# Patient Record
Sex: Female | Born: 1950 | Race: Black or African American | Hispanic: No | Marital: Single | Smoking: Never smoker
Health system: Southern US, Community
[De-identification: ages and names within clinical notes are randomized; demographics above are authoritative.]

## PROBLEM LIST (undated history)

## (undated) DIAGNOSIS — E119 Type 2 diabetes mellitus without complications: Secondary | ICD-10-CM

## (undated) DIAGNOSIS — D631 Anemia in chronic kidney disease: Secondary | ICD-10-CM

## (undated) DIAGNOSIS — G629 Polyneuropathy, unspecified: Secondary | ICD-10-CM

## (undated) DIAGNOSIS — K219 Gastro-esophageal reflux disease without esophagitis: Secondary | ICD-10-CM

## (undated) DIAGNOSIS — I10 Essential (primary) hypertension: Secondary | ICD-10-CM

## (undated) DIAGNOSIS — I5030 Unspecified diastolic (congestive) heart failure: Secondary | ICD-10-CM

## (undated) DIAGNOSIS — Z794 Long term (current) use of insulin: Secondary | ICD-10-CM

## (undated) DIAGNOSIS — E1140 Type 2 diabetes mellitus with diabetic neuropathy, unspecified: Secondary | ICD-10-CM

## (undated) DIAGNOSIS — B029 Zoster without complications: Secondary | ICD-10-CM

## (undated) DIAGNOSIS — IMO0001 Reserved for inherently not codable concepts without codable children: Secondary | ICD-10-CM

## (undated) DIAGNOSIS — N2581 Secondary hyperparathyroidism of renal origin: Secondary | ICD-10-CM

## (undated) DIAGNOSIS — M1990 Unspecified osteoarthritis, unspecified site: Secondary | ICD-10-CM

## (undated) DIAGNOSIS — I739 Peripheral vascular disease, unspecified: Secondary | ICD-10-CM

## (undated) DIAGNOSIS — E039 Hypothyroidism, unspecified: Secondary | ICD-10-CM

## (undated) DIAGNOSIS — N186 End stage renal disease: Secondary | ICD-10-CM

## (undated) DIAGNOSIS — E11319 Type 2 diabetes mellitus with unspecified diabetic retinopathy without macular edema: Secondary | ICD-10-CM

## (undated) DIAGNOSIS — N189 Chronic kidney disease, unspecified: Secondary | ICD-10-CM

## (undated) HISTORY — DX: Reserved for inherently not codable concepts without codable children: IMO0001

## (undated) HISTORY — DX: Chronic kidney disease, unspecified: N18.9

## (undated) HISTORY — DX: Type 2 diabetes mellitus with diabetic neuropathy, unspecified: E11.40

## (undated) HISTORY — DX: Secondary hyperparathyroidism of renal origin: N25.81

## (undated) HISTORY — DX: Essential (primary) hypertension: I10

## (undated) HISTORY — DX: Long term (current) use of insulin: Z79.4

## (undated) HISTORY — DX: Gastro-esophageal reflux disease without esophagitis: K21.9

## (undated) HISTORY — PX: CHOLECYSTECTOMY: SHX55

## (undated) HISTORY — DX: Type 2 diabetes mellitus with unspecified diabetic retinopathy without macular edema: E11.319

## (undated) HISTORY — DX: Hypothyroidism, unspecified: E03.9

## (undated) HISTORY — DX: Unspecified osteoarthritis, unspecified site: M19.90

## (undated) HISTORY — PX: THYROIDECTOMY: SHX17

## (undated) HISTORY — DX: Anemia in chronic kidney disease: D63.1

## (undated) HISTORY — DX: Type 2 diabetes mellitus without complications: E11.9

## (undated) HISTORY — PX: ABDOMINAL HYSTERECTOMY: SHX81

## (undated) SURGERY — A/V SHUNTOGRAM/FISTULAGRAM
Anesthesia: Moderate Sedation | Laterality: Left

---

## 2004-09-09 ENCOUNTER — Ambulatory Visit: Payer: Self-pay | Admitting: Gastroenterology

## 2005-09-07 ENCOUNTER — Ambulatory Visit: Payer: Self-pay | Admitting: Internal Medicine

## 2006-06-16 ENCOUNTER — Ambulatory Visit: Payer: Self-pay | Admitting: Internal Medicine

## 2006-06-19 ENCOUNTER — Ambulatory Visit: Payer: Self-pay | Admitting: Internal Medicine

## 2006-07-19 ENCOUNTER — Ambulatory Visit: Payer: Self-pay | Admitting: Internal Medicine

## 2006-09-08 ENCOUNTER — Ambulatory Visit: Payer: Self-pay | Admitting: Internal Medicine

## 2007-09-13 ENCOUNTER — Ambulatory Visit: Payer: Self-pay | Admitting: Internal Medicine

## 2008-07-12 ENCOUNTER — Ambulatory Visit: Payer: Self-pay | Admitting: Internal Medicine

## 2008-08-17 ENCOUNTER — Emergency Department: Payer: Self-pay | Admitting: Emergency Medicine

## 2008-08-28 ENCOUNTER — Ambulatory Visit: Payer: Self-pay | Admitting: Unknown Physician Specialty

## 2008-10-09 ENCOUNTER — Ambulatory Visit: Payer: Self-pay | Admitting: Internal Medicine

## 2009-10-16 ENCOUNTER — Ambulatory Visit: Payer: Self-pay | Admitting: Internal Medicine

## 2009-12-30 ENCOUNTER — Ambulatory Visit: Payer: Self-pay | Admitting: Gastroenterology

## 2010-11-11 ENCOUNTER — Ambulatory Visit: Payer: Self-pay | Admitting: Internal Medicine

## 2011-12-01 ENCOUNTER — Ambulatory Visit: Payer: Self-pay | Admitting: Internal Medicine

## 2012-12-06 ENCOUNTER — Ambulatory Visit: Payer: Self-pay | Admitting: Internal Medicine

## 2013-06-01 ENCOUNTER — Ambulatory Visit: Payer: Self-pay | Admitting: Internal Medicine

## 2013-06-08 ENCOUNTER — Ambulatory Visit: Payer: Self-pay | Admitting: Internal Medicine

## 2013-06-15 ENCOUNTER — Ambulatory Visit: Payer: Self-pay | Admitting: Surgery

## 2013-06-23 ENCOUNTER — Ambulatory Visit: Payer: Self-pay | Admitting: Surgery

## 2013-06-27 LAB — PATHOLOGY REPORT

## 2013-08-01 ENCOUNTER — Ambulatory Visit: Payer: Self-pay | Admitting: Hematology and Oncology

## 2013-08-01 LAB — CBC CANCER CENTER
Basophil #: 0.1 x10 3/mm (ref 0.0–0.1)
Basophil %: 1.2 %
Eosinophil %: 2.8 %
HCT: 30.5 % — ABNORMAL LOW (ref 35.0–47.0)
HGB: 10.2 g/dL — ABNORMAL LOW (ref 12.0–16.0)
Lymphocyte #: 2.5 x10 3/mm (ref 1.0–3.6)
Lymphocyte %: 39.6 %
MCHC: 33.6 g/dL (ref 32.0–36.0)
MCV: 91 fL (ref 80–100)
Monocyte #: 0.6 x10 3/mm (ref 0.2–0.9)
Neutrophil #: 3 x10 3/mm (ref 1.4–6.5)
Platelet: 250 x10 3/mm (ref 150–440)
RBC: 3.35 10*6/uL — ABNORMAL LOW (ref 3.80–5.20)

## 2013-08-19 ENCOUNTER — Ambulatory Visit: Payer: Self-pay | Admitting: Hematology and Oncology

## 2013-08-31 LAB — CBC CANCER CENTER
Eosinophil #: 0.2 x10 3/mm (ref 0.0–0.7)
Eosinophil %: 2.7 %
HGB: 10.6 g/dL — ABNORMAL LOW (ref 12.0–16.0)
Lymphocyte #: 2.3 x10 3/mm (ref 1.0–3.6)
Lymphocyte %: 32.1 %
MCH: 30.5 pg (ref 26.0–34.0)
MCHC: 33.2 g/dL (ref 32.0–36.0)
MCV: 92 fL (ref 80–100)
Monocyte #: 0.8 x10 3/mm (ref 0.2–0.9)
Monocyte %: 10.6 %
Neutrophil #: 3.9 x10 3/mm (ref 1.4–6.5)
Neutrophil %: 53.4 %
Platelet: 275 x10 3/mm (ref 150–440)
RBC: 3.49 10*6/uL — ABNORMAL LOW (ref 3.80–5.20)

## 2013-08-31 LAB — IRON AND TIBC
Iron Bind.Cap.(Total): 223 ug/dL — ABNORMAL LOW (ref 250–450)
Iron Saturation: 26 %
Iron: 57 ug/dL (ref 50–170)
Unbound Iron-Bind.Cap.: 166 ug/dL

## 2013-08-31 LAB — FERRITIN: Ferritin (ARMC): 285 ng/mL (ref 8–388)

## 2013-08-31 LAB — FOLATE: Folic Acid: 15.9 ng/mL (ref 3.1–100.0)

## 2013-09-18 ENCOUNTER — Ambulatory Visit: Payer: Self-pay | Admitting: Hematology and Oncology

## 2013-10-23 ENCOUNTER — Ambulatory Visit: Payer: Self-pay | Admitting: Gastroenterology

## 2013-12-13 ENCOUNTER — Ambulatory Visit: Payer: Self-pay | Admitting: Hematology and Oncology

## 2013-12-21 ENCOUNTER — Ambulatory Visit: Payer: Self-pay | Admitting: Hematology and Oncology

## 2014-01-24 ENCOUNTER — Ambulatory Visit: Payer: Self-pay | Admitting: Internal Medicine

## 2014-02-13 ENCOUNTER — Ambulatory Visit: Payer: Self-pay | Admitting: Surgery

## 2014-09-09 ENCOUNTER — Emergency Department: Payer: Self-pay | Admitting: Internal Medicine

## 2014-09-09 LAB — BASIC METABOLIC PANEL
ANION GAP: 8 (ref 7–16)
BUN: 41 mg/dL — ABNORMAL HIGH (ref 7–18)
CHLORIDE: 108 mmol/L — AB (ref 98–107)
CO2: 25 mmol/L (ref 21–32)
CREATININE: 1.47 mg/dL — AB (ref 0.60–1.30)
Calcium, Total: 8.4 mg/dL — ABNORMAL LOW (ref 8.5–10.1)
EGFR (African American): 46 — ABNORMAL LOW
EGFR (Non-African Amer.): 38 — ABNORMAL LOW
GLUCOSE: 173 mg/dL — AB (ref 65–99)
Osmolality: 296 (ref 275–301)
Potassium: 4.3 mmol/L (ref 3.5–5.1)
SODIUM: 141 mmol/L (ref 136–145)

## 2014-09-09 LAB — CBC WITH DIFFERENTIAL/PLATELET
BASOS PCT: 1.2 %
Basophil #: 0.1 10*3/uL (ref 0.0–0.1)
EOS PCT: 3.1 %
Eosinophil #: 0.2 10*3/uL (ref 0.0–0.7)
HCT: 32.7 % — AB (ref 35.0–47.0)
HGB: 10.5 g/dL — AB (ref 12.0–16.0)
LYMPHS ABS: 2.5 10*3/uL (ref 1.0–3.6)
LYMPHS PCT: 36.5 %
MCH: 30.1 pg (ref 26.0–34.0)
MCHC: 32.2 g/dL (ref 32.0–36.0)
MCV: 93 fL (ref 80–100)
Monocyte #: 0.6 x10 3/mm (ref 0.2–0.9)
Monocyte %: 9.5 %
Neutrophil #: 3.4 10*3/uL (ref 1.4–6.5)
Neutrophil %: 49.7 %
Platelet: 280 10*3/uL (ref 150–440)
RBC: 3.5 10*6/uL — ABNORMAL LOW (ref 3.80–5.20)
RDW: 13.3 % (ref 11.5–14.5)
WBC: 6.8 10*3/uL (ref 3.6–11.0)

## 2014-09-09 LAB — URINALYSIS, COMPLETE
Bacteria: NONE SEEN
Bilirubin,UR: NEGATIVE
Glucose,UR: NEGATIVE mg/dL (ref 0–75)
Ketone: NEGATIVE
Leukocyte Esterase: NEGATIVE
Nitrite: NEGATIVE
Ph: 5 (ref 4.5–8.0)
RBC,UR: 5 /HPF (ref 0–5)
Specific Gravity: 1.014 (ref 1.003–1.030)
Squamous Epithelial: 1
WBC UR: 3 /HPF (ref 0–5)

## 2014-09-09 LAB — TROPONIN I: Troponin-I: <0.02 ng/mL

## 2015-02-08 NOTE — Op Note (Signed)
PATIENT NAME:  Kendra [name], Kendra [name] MR#:  [id number] DATE OF BIRTH:  Oct 14, 1951  DATE OF PROCEDURE:  06/23/2013  PREOPERATIVE DIAGNOSIS: Chronic cholecystitis.   POSTOPERATIVE DIAGNOSIS: Chronic cholecystitis.   PROCEDURE: Laparoscopic cholecystectomy, cholangiogram.   SURGEON: Rochel Brome, MD  ANESTHESIA: General.   INDICATION: This 64 year old female reports right upper quadrant pains dating back to April. She has also had fatty food intolerance. Ultrasound demonstrated sludge within the gallbladder, hepatobiliary scan with abnormally low ejection fraction of 21% and surgery was recommended for definitive treatment.   DESCRIPTION OF PROCEDURE: The patient was placed on the operating table in the supine position under general endotracheal anesthesia. The abdomen was prepared with ChloraPrep and draped in a sterile manner.   A short incision was made in the inferior aspect of the umbilicus and carried down to the deep fascia which was grasped with laryngeal hook and elevated. A Veress needle was inserted, aspirated and irrigated with a saline solution. Next, the peritoneal cavity was inflated with carbon dioxide. The Veress needle was removed. The 10 mm cannula was inserted. The 10 mm, 0 degree laparoscope was inserted to view the peritoneal cavity. Another incision was made in the epigastrium slightly to the right of the midline to introduce an 11 mm cannula. Two incisions were made in the lateral aspect of the right upper quadrant to insert two 5 mm cannulas.   The liver appeared normal. The gallbladder was somewhat distended and was retracted towards the right shoulder. The infundibulum was retracted inferiorly and laterally. The porta hepatis was demonstrated. The gallbladder neck was mobilized with incision of the visceral peritoneum. The cystic duct was dissected free from surrounding structures. It was somewhat large in size. The cystic artery was dissected free from surrounding structures.  The gallbladder neck was further mobilized. A critical view of safety was demonstrated. The artery was directly overlying the cystic duct and was controlled with endoclips and divided. Next, an Endo Clip was placed across the cystic duct. An incision was made in the cystic duct to introduce a Reddick catheter. Half-strength Conray-60 dye was injected as the cholangiogram was done with fluoroscopy viewing the biliary tree. There were 2 lucencies which were interpreted as air bubbles, but there was prompt flow of dye into the duodenum, and the Reddick catheter was removed. The cystic duct was doubly ligated with endoclips and divided. The gallbladder was dissected free from the liver with hook and cautery. There was scant bleeding. Several small bleeding points were cauterized. Hemostasis was subsequently intact. The gallbladder was delivered up through the infraumbilical incision, opened and suctioned. It did contain some dark bile and was submitted in formalin for routine pathology. The right upper quadrant was further inspected. Hemostasis was intact. The cannulas were removed. Carbon dioxide was allowed to escape from the peritoneal cavity. Several small subcutaneous bleeding points were cauterized. The wounds were closed with interrupted 5-0 chromic subcuticular suture, benzoin and Steri-Strips. Dressings were applied with paper tape. The patient tolerated surgery satisfactorily and was prepared for transfer to the recovery room. ____________________________ Lenna Sciara. Rochel Brome, MD jws:sb D: 06/23/2013 14:31:36 ET T: 06/23/2013 17:00:38 ET JOB#: [id number]  cc: Loreli Dollar, MD, <Dictator> Loreli Dollar MD ELECTRONICALLY SIGNED 06/26/2013 20:08

## 2015-07-02 ENCOUNTER — Other Ambulatory Visit: Payer: Self-pay | Admitting: Nephrology

## 2015-07-02 DIAGNOSIS — N183 Chronic kidney disease, stage 3 unspecified: Secondary | ICD-10-CM

## 2015-07-03 ENCOUNTER — Ambulatory Visit: Payer: Self-pay

## 2015-07-08 ENCOUNTER — Ambulatory Visit
Admission: RE | Admit: 2015-07-08 | Discharge: 2015-07-08 | Disposition: A | Discharge status: Home / Self Care | Payer: Managed Care, Other (non HMO) | Source: Ambulatory Visit | Attending: Nephrology | Admitting: Nephrology

## 2015-07-08 DIAGNOSIS — I10 Essential (primary) hypertension: Secondary | ICD-10-CM | POA: Insufficient documentation

## 2015-07-08 DIAGNOSIS — N183 Chronic kidney disease, stage 3 unspecified: Secondary | ICD-10-CM

## 2015-07-08 DIAGNOSIS — N179 Acute kidney failure, unspecified: Secondary | ICD-10-CM | POA: Diagnosis not present

## 2015-07-08 DIAGNOSIS — R809 Proteinuria, unspecified: Secondary | ICD-10-CM | POA: Insufficient documentation

## 2015-07-08 DIAGNOSIS — R319 Hematuria, unspecified: Secondary | ICD-10-CM | POA: Insufficient documentation

## 2015-07-11 ENCOUNTER — Ambulatory Visit: Payer: Self-pay

## 2015-07-11 ENCOUNTER — Ambulatory Visit: Payer: Self-pay | Admitting: Internal Medicine

## 2015-07-12 ENCOUNTER — Encounter: Payer: Self-pay | Admitting: *Deleted

## 2015-07-15 ENCOUNTER — Ambulatory Visit: Payer: Self-pay | Admitting: Internal Medicine

## 2015-07-15 ENCOUNTER — Inpatient Hospital Stay: Payer: Managed Care, Other (non HMO)

## 2015-07-15 ENCOUNTER — Encounter: Payer: Self-pay | Admitting: *Deleted

## 2015-07-15 ENCOUNTER — Inpatient Hospital Stay: Payer: Managed Care, Other (non HMO) | Attending: Internal Medicine | Admitting: Internal Medicine

## 2015-07-15 VITALS — BP 164/95 | HR 85 | Temp 97.6°F | Resp 18 | Ht 66.0 in | Wt 222.7 lb

## 2015-07-15 DIAGNOSIS — D649 Anemia, unspecified: Secondary | ICD-10-CM | POA: Insufficient documentation

## 2015-07-15 DIAGNOSIS — R0602 Shortness of breath: Secondary | ICD-10-CM | POA: Insufficient documentation

## 2015-07-15 DIAGNOSIS — N189 Chronic kidney disease, unspecified: Secondary | ICD-10-CM | POA: Insufficient documentation

## 2015-07-15 DIAGNOSIS — Z79899 Other long term (current) drug therapy: Secondary | ICD-10-CM

## 2015-07-15 DIAGNOSIS — N183 Chronic kidney disease, stage 3 unspecified: Secondary | ICD-10-CM

## 2015-07-15 DIAGNOSIS — Z794 Long term (current) use of insulin: Secondary | ICD-10-CM | POA: Diagnosis not present

## 2015-07-15 DIAGNOSIS — R5383 Other fatigue: Secondary | ICD-10-CM | POA: Diagnosis not present

## 2015-07-15 DIAGNOSIS — I10 Essential (primary) hypertension: Secondary | ICD-10-CM | POA: Insufficient documentation

## 2015-07-15 DIAGNOSIS — E119 Type 2 diabetes mellitus without complications: Secondary | ICD-10-CM | POA: Insufficient documentation

## 2015-07-15 DIAGNOSIS — D631 Anemia in chronic kidney disease: Secondary | ICD-10-CM

## 2015-07-15 DIAGNOSIS — R600 Localized edema: Secondary | ICD-10-CM | POA: Insufficient documentation

## 2015-07-15 DIAGNOSIS — I129 Hypertensive chronic kidney disease with stage 1 through stage 4 chronic kidney disease, or unspecified chronic kidney disease: Secondary | ICD-10-CM | POA: Diagnosis not present

## 2015-07-15 LAB — CBC WITH DIFFERENTIAL/PLATELET
BASOS ABS: 0.1 10*3/uL (ref 0–0.1)
BASOS PCT: 1 %
Eosinophils Absolute: 0.2 10*3/uL (ref 0–0.7)
Eosinophils Relative: 3 %
HEMATOCRIT: 26.3 % — AB (ref 35.0–47.0)
Hemoglobin: 8.7 g/dL — ABNORMAL LOW (ref 12.0–16.0)
LYMPHS PCT: 29 %
Lymphs Abs: 2 10*3/uL (ref 1.0–3.6)
MCH: 30.2 pg (ref 26.0–34.0)
MCHC: 33.2 g/dL (ref 32.0–36.0)
MCV: 90.9 fL (ref 80.0–100.0)
Monocytes Absolute: 0.7 10*3/uL (ref 0.2–0.9)
Monocytes Relative: 10 %
NEUTROS ABS: 3.9 10*3/uL (ref 1.4–6.5)
NEUTROS PCT: 57 %
Platelets: 272 10*3/uL (ref 150–440)
RBC: 2.89 MIL/uL — AB (ref 3.80–5.20)
RDW: 13.3 % (ref 11.5–14.5)
WBC: 6.9 10*3/uL (ref 3.6–11.0)

## 2015-07-15 LAB — RETICULOCYTES
RBC.: 2.89 MIL/uL — AB (ref 3.80–5.20)
RETIC COUNT ABSOLUTE: 49.1 10*3/uL (ref 19.0–183.0)
Retic Ct Pct: 1.7 % (ref 0.4–3.1)

## 2015-07-15 LAB — IRON AND TIBC
IRON: 45 ug/dL (ref 28–170)
Saturation Ratios: 18 % (ref 10.4–31.8)
TIBC: 257 ug/dL (ref 250–450)
UIBC: 212 ug/dL

## 2015-07-15 LAB — FOLATE: FOLATE: 30 ng/mL (ref >5.9)

## 2015-07-15 LAB — LACTATE DEHYDROGENASE: LDH: 242 U/L — ABNORMAL HIGH (ref 98–192)

## 2015-07-15 LAB — VITAMIN B12: Vitamin B-12: 464 pg/mL (ref 180–914)

## 2015-07-15 LAB — FERRITIN: FERRITIN: 195 ng/mL (ref 11–307)

## 2015-07-15 NOTE — Progress Notes (Signed)
[place] OFFICE PROGRESS NOTE  Patient Care Team: Kendra Brunner, MD as PCP - General (Internal Medicine) Kendra Dana, MD as Consulting Physician (Internal Medicine) Kendra Sickle, MD as Consulting Physician (Hematology and Oncology)  HPI  SUMMARY of HEMATOLOGIC HISTORY:   # CHRONIC ANEMIA; SEP 2016-Hb-9.4 Urine PEP- NEG; K/L= 3.45 [N=1.65]; NOV 2014-M protein 0.2gm [IgG+IgA]; Jan 2015 Colonoscopy/EGD with Dr.Oh Gita Kudo in 5 years]; SEP 2016- START PROCRIT 40K units qW  # CKD [SEP 2016-creat ~2; Proteinuria 3.3 gm/dL; Dr.Kolluru]  INTERVAL HISTORY:  This is my first interaction with the patient since I joined the practice September 2016. I reviewed the patient's prior chart/pertinent labs/imaging in detail; findings are summarized.  Very pleasant 64 year old  female patient with a long-standing history of anemia and also chronic renal insufficiency is here for follow-up. Patient had last been seen in in this clinic approximately 2 years ago.  Follow-up  Nephrology earlier this month showed a hemoglobin of 9.4; worsening creatinine around 2.0. She has been referred to [place] for further evaluation/  Need for Procrit.  Patient complains of significant fatigue;especially shortness of breath on exertion. She denies any blood in stools or black stools. She also complains of swelling in the swelling in the legs.   REVIEW OF SYSTEMS:  A complete 10 point review of system is done which is negative except mentioned above in history of present illness  I have reviewed the past medical history, past surgical history, social history and family history with the patient and they are unchanged from previous note unless stated above.  ALLERGIES:  has No Known Allergies.  MEDICATIONS:  Current Outpatient Prescriptions  Medication Sig Dispense Refill  . acetaminophen (TYLENOL) 500 MG tablet Take 500 mg by mouth 2 (two) times a week.    . calcium carbonate (TUMS EX) 750 MG  chewable tablet Chew 300 mg by mouth as needed. For heartburn    . enalapril (VASOTEC) 10 MG tablet Take 10 mg by mouth daily.    . ergocalciferol (VITAMIN D2) 50000 UNITS capsule Take 50,000 Units by mouth once a week.    . insulin aspart (NOVOLOG FLEXPEN) 100 UNIT/ML FlexPen Inject 12 Units into the skin 3 (three) times daily.    . insulin glargine (LANTUS) 100 UNIT/ML injection Inject 25 Units into the skin at bedtime. 25- 30 units at bedtime depending on glucose serum levels    . Insulin Pen Needle (PEN NEEDLES 31GX5/16") 31G X 8 MM MISC Use for insulin injection TID    . levothyroxine (SYNTHROID, LEVOTHROID) 137 MCG tablet Take 1 tablet by mouth daily. Take on empty stomach 30 to 60 minutes before breakfast    . meclizine (ANTIVERT) 25 MG tablet Take 12.5 mg by mouth 3 (three) times daily.    . Multiple Vitamins-Minerals (CENTRUM SILVER ADULT 50+ PO) Take 1 tablet by mouth daily.    . simvastatin (ZOCOR) 40 MG tablet Take 40 mg by mouth 2 (two) times a week.     No current facility-administered medications for this visit.    PHYSICAL EXAMINATION:   BP 164/95 mmHg  Pulse 85  Temp(Src) 97.6 F (36.4 C) (Tympanic)  Resp 18  Ht $R'5\' 6"'bN$  (1.676 m)  Wt 222 lb 10.6 oz (100.999 kg)  BMI 35.96 kg/m2  Filed Weights   07/15/15 1550  Weight: 222 lb 10.6 oz (100.999 kg)    GENERAL:alert, no distress and comfortable. She is alone.  EYES: positive for pallor OROPHARYNX:no exudate, no erythema and lips,  buccal mucosa, and tongue normal  NECK: No thyromegaly LYMPH:  no palpable lymphadenopathy in the cervical, axillary or inguinal LUNGS: clear to auscultation with normal breathing effort; No Wheeze or crackles  Cardio-vascular- Regular Rate & Rythm and no murmurs; positive for edema 1 + plus Bil.  ABDOMEN:abdomen soft, non-tender and normal bowel sounds; No hepato-splenomegaly.  Musculoskeletal:no cyanosis of digits and no clubbing  NEURO: alert & oriented x 3 with fluent speech, no focal  motor/sensory deficits. SKIN: no skin rash; positive for pallor  LABORATORY DATA:  I have reviewed the data as listed    Component Value Date/Time   NA 141 09/09/2014 1357   K 4.3 09/09/2014 1357   CL 108* 09/09/2014 1357   CO2 25 09/09/2014 1357   GLUCOSE 173* 09/09/2014 1357   BUN 41* 09/09/2014 1357   CREATININE 1.47* 09/09/2014 1357   CALCIUM 8.4* 09/09/2014 1357   GFRNONAA 38* 09/09/2014 1357   GFRAA 46* 09/09/2014 1357    No results found for: SPEP, UPEP  Lab Results  Component Value Date   WBC 6.8 09/09/2014   NEUTROABS 3.4 09/09/2014   HGB 10.5* 09/09/2014   HCT 32.7* 09/09/2014   MCV 93 09/09/2014   PLT 280 09/09/2014      Chemistry      Component Value Date/Time   NA 141 09/09/2014 1357   K 4.3 09/09/2014 1357   CL 108* 09/09/2014 1357   CO2 25 09/09/2014 1357   BUN 41* 09/09/2014 1357   CREATININE 1.47* 09/09/2014 1357      Component Value Date/Time   CALCIUM 8.4* 09/09/2014 1357       RADIOGRAPHIC STUDIES: I have personally reviewed the radiological images as listed and agreed with the findings in the report. No results found.   ASSESSMENT & PLAN:   # Chronic anemia- likely from chronic kidney disease. Most recent hemoglobin approximately 10 days ago was 9.4; this is likely  From worsening  Kidney function.  I recommend checking CBC; LDH; ferritin; folic acid; soluble transferrin receptor;  Reticulocyte count; B12; serum monoclonal workup at this time.   Discussed with the patient that Procrit would be recommended on a weekly basis- To help improve the hemoglobin. Discussed that the mechanism of action; and the potential side effects of hypertension; and if hemoglobin above 12; the potential for strokes; also the rare risk of progression of any underlying malignancy.patient agreed.    Orders Placed This Encounter  Procedures  . Multiple myeloma panel, serum (IFE and PE, serum)    Standing Status: Future     Number of Occurrences: 1      Standing Expiration Date: 07/14/2016  . Erythropoietin    Standing Status: Future     Number of Occurrences: 1     Standing Expiration Date: 07/14/2016  . Ferritin    Standing Status: Future     Number of Occurrences: 1     Standing Expiration Date: 07/14/2016  . Iron and TIBC    Standing Status: Future     Number of Occurrences: 1     Standing Expiration Date: 07/14/2016  . Haptoglobin    Standing Status: Future     Number of Occurrences: 1     Standing Expiration Date: 07/14/2016  . Lactate dehydrogenase    Standing Status: Future     Number of Occurrences: 1     Standing Expiration Date: 07/14/2016  . Vitamin B12    Standing Status: Future     Number of Occurrences: 1  Standing Expiration Date: 07/14/2016  . Folate    Standing Status: Future     Number of Occurrences: 1     Standing Expiration Date: 07/14/2016  . CBC with Differential    Standing Status: Future     Number of Occurrences: 1     Standing Expiration Date: 07/14/2016  . Soluble transferrin receptor    Standing Status: Future     Number of Occurrences: 1     Standing Expiration Date: 07/14/2016  . Hematocrit ([facility])    Standing Status: Standing     Number of Occurrences: 8     Standing Expiration Date: 07/14/2016  . Hemoglobin [facility])    Standing Status: Standing     Number of Occurrences: 8     Standing Expiration Date: 07/14/2016  . Reticulocytes    Standing Status: Future     Number of Occurrences: 1     Standing Expiration Date: 07/14/2016   All questions were answered. The patient knows to call the clinic with any problems, questions or concerns. No barriers to learning was detected. & I spent 25 counseling the patient face to face. The total time spent in the appointment was 40 minutes and more than 50% was on counseling and review of test results     Kendra Sickle, MD 07/15/2015 4:27 PM

## 2015-07-16 LAB — SOLUBLE TRANSFERRIN RECEPTOR: Transferrin Receptor: 17.2 nmol/L (ref 12.2–27.3)

## 2015-07-16 LAB — ERYTHROPOIETIN: ERYTHROPOIETIN: 12 m[IU]/mL (ref 2.6–18.5)

## 2015-07-16 LAB — HAPTOGLOBIN: Haptoglobin: 224 mg/dL — ABNORMAL HIGH (ref 34–200)

## 2015-07-17 LAB — MULTIPLE MYELOMA PANEL, SERUM
ALBUMIN/GLOB SERPL: 1.1 (ref 0.7–1.7)
ALPHA2 GLOB SERPL ELPH-MCNC: 0.9 g/dL (ref 0.4–1.0)
Albumin SerPl Elph-Mcnc: 3.4 g/dL (ref 2.9–4.4)
Alpha 1: 0.2 g/dL (ref 0.0–0.4)
B-Globulin SerPl Elph-Mcnc: 0.9 g/dL (ref 0.7–1.3)
Gamma Glob SerPl Elph-Mcnc: 1.1 g/dL (ref 0.4–1.8)
Globulin, Total: 3.1 g/dL (ref 2.2–3.9)
IGG (IMMUNOGLOBIN G), SERUM: 1035 mg/dL (ref 700–1600)
IGM, SERUM: 246 mg/dL — AB (ref 26–217)
IgA: 256 mg/dL (ref 87–352)
M Protein SerPl Elph-Mcnc: 0.2 g/dL — ABNORMAL HIGH
TOTAL PROTEIN ELP: 6.5 g/dL (ref 6.0–8.5)

## 2015-07-22 ENCOUNTER — Inpatient Hospital Stay: Payer: Managed Care, Other (non HMO)

## 2015-07-22 ENCOUNTER — Inpatient Hospital Stay: Payer: Managed Care, Other (non HMO) | Attending: Internal Medicine

## 2015-07-22 DIAGNOSIS — D631 Anemia in chronic kidney disease: Secondary | ICD-10-CM | POA: Insufficient documentation

## 2015-07-22 DIAGNOSIS — N189 Chronic kidney disease, unspecified: Secondary | ICD-10-CM | POA: Insufficient documentation

## 2015-07-29 ENCOUNTER — Other Ambulatory Visit: Payer: Self-pay | Admitting: Family Medicine

## 2015-07-29 ENCOUNTER — Inpatient Hospital Stay: Payer: Managed Care, Other (non HMO)

## 2015-08-05 ENCOUNTER — Inpatient Hospital Stay: Payer: Managed Care, Other (non HMO)

## 2015-08-12 ENCOUNTER — Inpatient Hospital Stay: Payer: Managed Care, Other (non HMO)

## 2015-08-14 ENCOUNTER — Encounter: Payer: Self-pay | Admitting: *Deleted

## 2015-08-19 ENCOUNTER — Inpatient Hospital Stay: Payer: Managed Care, Other (non HMO)

## 2015-08-19 ENCOUNTER — Other Ambulatory Visit: Payer: Self-pay | Admitting: *Deleted

## 2015-08-19 DIAGNOSIS — N183 Chronic kidney disease, stage 3 unspecified: Secondary | ICD-10-CM

## 2015-08-19 DIAGNOSIS — N189 Chronic kidney disease, unspecified: Secondary | ICD-10-CM | POA: Diagnosis not present

## 2015-08-19 DIAGNOSIS — D631 Anemia in chronic kidney disease: Secondary | ICD-10-CM

## 2015-08-19 LAB — HEMATOCRIT: HEMATOCRIT: 25.5 % — AB (ref 35.0–47.0)

## 2015-08-19 LAB — HEMOGLOBIN: HEMOGLOBIN: 8.5 g/dL — AB (ref 12.0–16.0)

## 2015-08-19 NOTE — Progress Notes (Signed)
RN unable to administer procrit today. BP elevated 190/96 per Olegario Shearer, RN. Recheck 183/82. Patient stated that she took her antihypertensive medications today as prescribed. Dr. Rogue Bussing was informed. Patient was r/s for procrit for 08/20/15.

## 2015-08-19 NOTE — Progress Notes (Signed)
Patient to reschedule appointment for Procrit injection, due to high BP.

## 2015-08-20 ENCOUNTER — Inpatient Hospital Stay: Payer: Managed Care, Other (non HMO) | Attending: Internal Medicine

## 2015-08-20 VITALS — BP 159/81 | HR 96 | Resp 18

## 2015-08-20 DIAGNOSIS — N183 Chronic kidney disease, stage 3 (moderate): Secondary | ICD-10-CM | POA: Insufficient documentation

## 2015-08-20 DIAGNOSIS — Z79899 Other long term (current) drug therapy: Secondary | ICD-10-CM | POA: Diagnosis not present

## 2015-08-20 DIAGNOSIS — D631 Anemia in chronic kidney disease: Secondary | ICD-10-CM | POA: Diagnosis present

## 2015-08-20 DIAGNOSIS — N189 Chronic kidney disease, unspecified: Secondary | ICD-10-CM

## 2015-08-20 MED ORDER — EPOETIN ALFA 20000 UNIT/ML IJ SOLN
20000.0000 [IU] | Freq: Once | INTRAMUSCULAR | Status: AC
  Administered 2015-08-20: 20000 [IU] via SUBCUTANEOUS
  Filled 2015-08-20: qty 1

## 2015-08-26 ENCOUNTER — Inpatient Hospital Stay: Payer: Managed Care, Other (non HMO)

## 2015-08-26 DIAGNOSIS — N183 Chronic kidney disease, stage 3 (moderate): Principal | ICD-10-CM

## 2015-08-26 DIAGNOSIS — D631 Anemia in chronic kidney disease: Secondary | ICD-10-CM

## 2015-08-26 LAB — HEMOGLOBIN: Hemoglobin: 8.8 g/dL — ABNORMAL LOW (ref 12.0–16.0)

## 2015-08-26 LAB — HEMATOCRIT: HEMATOCRIT: 27 % — AB (ref 35.0–47.0)

## 2015-08-27 ENCOUNTER — Inpatient Hospital Stay: Payer: Managed Care, Other (non HMO) | Attending: Internal Medicine

## 2015-08-27 VITALS — BP 177/90 | HR 77 | Resp 20

## 2015-08-27 DIAGNOSIS — Z79899 Other long term (current) drug therapy: Secondary | ICD-10-CM | POA: Insufficient documentation

## 2015-08-27 DIAGNOSIS — Z794 Long term (current) use of insulin: Secondary | ICD-10-CM | POA: Insufficient documentation

## 2015-08-27 DIAGNOSIS — I129 Hypertensive chronic kidney disease with stage 1 through stage 4 chronic kidney disease, or unspecified chronic kidney disease: Secondary | ICD-10-CM | POA: Insufficient documentation

## 2015-08-27 DIAGNOSIS — D631 Anemia in chronic kidney disease: Secondary | ICD-10-CM | POA: Insufficient documentation

## 2015-08-27 DIAGNOSIS — M7989 Other specified soft tissue disorders: Secondary | ICD-10-CM | POA: Insufficient documentation

## 2015-08-27 DIAGNOSIS — R5383 Other fatigue: Secondary | ICD-10-CM | POA: Insufficient documentation

## 2015-08-27 DIAGNOSIS — N189 Chronic kidney disease, unspecified: Secondary | ICD-10-CM

## 2015-08-27 DIAGNOSIS — N183 Chronic kidney disease, stage 3 (moderate): Secondary | ICD-10-CM | POA: Insufficient documentation

## 2015-08-27 DIAGNOSIS — R0602 Shortness of breath: Secondary | ICD-10-CM | POA: Insufficient documentation

## 2015-08-27 MED ORDER — EPOETIN ALFA 20000 UNIT/ML IJ SOLN: 20000.0000 [IU] | Freq: Once | INTRAMUSCULAR | Status: DC

## 2015-08-29 ENCOUNTER — Inpatient Hospital Stay: Payer: Managed Care, Other (non HMO)

## 2015-08-29 ENCOUNTER — Ambulatory Visit: Payer: Managed Care, Other (non HMO)

## 2015-08-29 VITALS — BP 159/79 | HR 84 | Temp 97.0°F | Resp 20

## 2015-08-29 DIAGNOSIS — R5383 Other fatigue: Secondary | ICD-10-CM | POA: Diagnosis not present

## 2015-08-29 DIAGNOSIS — D631 Anemia in chronic kidney disease: Secondary | ICD-10-CM | POA: Diagnosis not present

## 2015-08-29 DIAGNOSIS — I129 Hypertensive chronic kidney disease with stage 1 through stage 4 chronic kidney disease, or unspecified chronic kidney disease: Secondary | ICD-10-CM | POA: Diagnosis present

## 2015-08-29 DIAGNOSIS — N183 Chronic kidney disease, stage 3 (moderate): Secondary | ICD-10-CM | POA: Diagnosis not present

## 2015-08-29 DIAGNOSIS — N189 Chronic kidney disease, unspecified: Principal | ICD-10-CM

## 2015-08-29 DIAGNOSIS — Z79899 Other long term (current) drug therapy: Secondary | ICD-10-CM | POA: Diagnosis not present

## 2015-08-29 DIAGNOSIS — Z794 Long term (current) use of insulin: Secondary | ICD-10-CM | POA: Diagnosis not present

## 2015-08-29 DIAGNOSIS — R0602 Shortness of breath: Secondary | ICD-10-CM | POA: Diagnosis not present

## 2015-08-29 DIAGNOSIS — M7989 Other specified soft tissue disorders: Secondary | ICD-10-CM | POA: Diagnosis not present

## 2015-08-29 MED ORDER — EPOETIN ALFA 20000 UNIT/ML IJ SOLN
20000.0000 [IU] | Freq: Once | INTRAMUSCULAR | Status: AC
  Administered 2015-08-29: 20000 [IU] via SUBCUTANEOUS
  Filled 2015-08-29: qty 1

## 2015-09-02 ENCOUNTER — Inpatient Hospital Stay: Payer: Managed Care, Other (non HMO)

## 2015-09-02 DIAGNOSIS — I129 Hypertensive chronic kidney disease with stage 1 through stage 4 chronic kidney disease, or unspecified chronic kidney disease: Secondary | ICD-10-CM | POA: Diagnosis not present

## 2015-09-02 DIAGNOSIS — D631 Anemia in chronic kidney disease: Secondary | ICD-10-CM

## 2015-09-02 DIAGNOSIS — N183 Chronic kidney disease, stage 3 (moderate): Principal | ICD-10-CM

## 2015-09-02 LAB — HEMATOCRIT: HEMATOCRIT: 28 % — AB (ref 35.0–47.0)

## 2015-09-02 LAB — HEMOGLOBIN: HEMOGLOBIN: 9.2 g/dL — AB (ref 12.0–16.0)

## 2015-09-03 ENCOUNTER — Inpatient Hospital Stay: Payer: Managed Care, Other (non HMO)

## 2015-09-03 VITALS — BP 157/84 | HR 94 | Temp 98.6°F | Resp 18

## 2015-09-03 DIAGNOSIS — I129 Hypertensive chronic kidney disease with stage 1 through stage 4 chronic kidney disease, or unspecified chronic kidney disease: Secondary | ICD-10-CM | POA: Diagnosis not present

## 2015-09-03 DIAGNOSIS — N189 Chronic kidney disease, unspecified: Principal | ICD-10-CM

## 2015-09-03 DIAGNOSIS — D631 Anemia in chronic kidney disease: Secondary | ICD-10-CM

## 2015-09-03 MED ORDER — EPOETIN ALFA 20000 UNIT/ML IJ SOLN
20000.0000 [IU] | Freq: Once | INTRAMUSCULAR | Status: AC
  Administered 2015-09-03: 20000 [IU] via SUBCUTANEOUS
  Filled 2015-09-03: qty 1

## 2015-09-16 ENCOUNTER — Ambulatory Visit: Payer: Managed Care, Other (non HMO) | Admitting: Internal Medicine

## 2015-09-16 ENCOUNTER — Other Ambulatory Visit: Payer: Self-pay | Admitting: *Deleted

## 2015-09-16 ENCOUNTER — Inpatient Hospital Stay: Payer: Managed Care, Other (non HMO)

## 2015-09-16 ENCOUNTER — Inpatient Hospital Stay (HOSPITAL_BASED_OUTPATIENT_CLINIC_OR_DEPARTMENT_OTHER): Payer: Managed Care, Other (non HMO) | Admitting: Internal Medicine

## 2015-09-16 ENCOUNTER — Encounter: Payer: Self-pay | Admitting: Internal Medicine

## 2015-09-16 VITALS — BP 181/80 | HR 86 | Temp 97.9°F | Resp 18 | Ht 66.0 in | Wt 220.5 lb

## 2015-09-16 DIAGNOSIS — M7989 Other specified soft tissue disorders: Secondary | ICD-10-CM

## 2015-09-16 DIAGNOSIS — N183 Chronic kidney disease, stage 3 (moderate): Principal | ICD-10-CM

## 2015-09-16 DIAGNOSIS — R0602 Shortness of breath: Secondary | ICD-10-CM

## 2015-09-16 DIAGNOSIS — I129 Hypertensive chronic kidney disease with stage 1 through stage 4 chronic kidney disease, or unspecified chronic kidney disease: Secondary | ICD-10-CM

## 2015-09-16 DIAGNOSIS — D631 Anemia in chronic kidney disease: Secondary | ICD-10-CM | POA: Diagnosis not present

## 2015-09-16 DIAGNOSIS — N189 Chronic kidney disease, unspecified: Principal | ICD-10-CM

## 2015-09-16 DIAGNOSIS — R5383 Other fatigue: Secondary | ICD-10-CM

## 2015-09-16 DIAGNOSIS — Z794 Long term (current) use of insulin: Secondary | ICD-10-CM

## 2015-09-16 DIAGNOSIS — Z79899 Other long term (current) drug therapy: Secondary | ICD-10-CM | POA: Diagnosis not present

## 2015-09-16 LAB — HEMATOCRIT: HEMATOCRIT: 32 % — AB (ref 35.0–47.0)

## 2015-09-16 LAB — HEMOGLOBIN: Hemoglobin: 10.1 g/dL — ABNORMAL LOW (ref 12.0–16.0)

## 2015-09-16 NOTE — Progress Notes (Signed)
[place] OFFICE PROGRESS NOTE  Patient Care Team: Lottie Mussel III, MD as PCP - General (Internal Medicine) [name], MD as Consulting Physician (Internal Medicine) [name], MD as Consulting Physician (Hematology and Oncology)  HPI  SUMMARY of HEMATOLOGIC HISTORY:   # CHRONIC ANEMIA; SEP 2016-Hb-9.4 Urine PEP- NEG; K/L= 3.45 [N=1.65]; NOV 2014-M protein 0.2gm [IgG+IgA]; Jan 2015 Colonoscopy/EGD with Dr.Oh Gita Kudo in 5 years]; SEP 2016- START PROCRIT 20K units qW x4;PR;  NOV 28th START PROCRIT 40 K q 2W  # CKD [SEP 2016-creat ~2; Proteinuria 3.3 gm/dL; Dr.Kolluru]  INTERVAL HISTORY:  Very pleasant 64 year old  female patient with a long-standing history of anemia and also chronic renal insufficiency is here for follow-up; start on Procrit in September 2016 is here for follow-up.  Patient feels her fatigue/shortness of breath is slightly improved. She continues to and any blood in stools black stools. She also complains of swelling in the swelling in the legs.   REVIEW OF SYSTEMS:  A complete 10 point review of system is done which is negative except mentioned above in history of present illness  I have reviewed the past medical history, past surgical history, social history and family history with the patient and they are unchanged from previous note unless stated above.  ALLERGIES:  has No Known Allergies.  MEDICATIONS:  Current Outpatient Prescriptions  Medication Sig Dispense Refill  . acetaminophen (TYLENOL) 500 MG tablet Take 500 mg by mouth 2 (two) times a week.    . calcium carbonate (TUMS EX) 750 MG chewable tablet Chew 300 mg by mouth as needed. For heartburn    . enalapril (VASOTEC) 10 MG tablet Take 10 mg by mouth daily.    . ergocalciferol (VITAMIN D2) 50000 UNITS capsule Take 50,000 Units by mouth every 30 (thirty) days.     . insulin aspart (NOVOLOG FLEXPEN) 100 UNIT/ML FlexPen Inject 12 Units into the skin 3 (three) times daily.     . insulin glargine (LANTUS) 100 UNIT/ML injection Inject 25 Units into the skin at bedtime. 25- 30 units at bedtime depending on glucose serum levels    . Insulin Pen Needle (PEN NEEDLES 31GX5/16") 31G X 8 MM MISC Use for insulin injection TID    . levothyroxine (SYNTHROID, LEVOTHROID) 137 MCG tablet Take 1 tablet by mouth daily. Take on empty stomach 30 to 60 minutes before breakfast    . meclizine (ANTIVERT) 25 MG tablet Take 12.5 mg by mouth 3 (three) times daily.    . Multiple Vitamins-Minerals (CENTRUM SILVER ADULT 50+ PO) Take 1 tablet by mouth daily.    . simvastatin (ZOCOR) 40 MG tablet Take 40 mg by mouth 2 (two) times a week.     No current facility-administered medications for this visit.    PHYSICAL EXAMINATION:   BP 181/80 mmHg  Pulse 86  Temp(Src) 97.9 F (36.6 C) (Tympanic)  Resp 18  Ht 5\' 6"  (1.676 m)  Wt 220 lb 7.4 oz (100 kg)  BMI 35.60 kg/m2  SpO2 99%  Filed Weights   09/16/15 1541  Weight: 220 lb 7.4 oz (100 kg)    GENERAL:alert, no distress and comfortable. She is alone.  EYES: positive for pallor OROPHARYNX:no exudate, no erythema and lips, buccal mucosa, and tongue normal  NECK: No thyromegaly LYMPH:  no palpable lymphadenopathy in the cervical, axillary or inguinal LUNGS: clear to auscultation with normal breathing effort; No Wheeze or crackles  Cardio-vascular- Regular Rate & Rythm and no murmurs; positive for edema 1 +  plus Bil.  ABDOMEN:abdomen soft, non-tender and normal bowel sounds; No hepato-splenomegaly.  Musculoskeletal:no cyanosis of digits and no clubbing  NEURO: alert & oriented x 3 with fluent speech, no focal motor/sensory deficits. SKIN: no skin rash; positive for pallor  LABORATORY DATA:  I have reviewed the data as listed    Component Value Date/Time   NA 141 09/09/2014 1357   K 4.3 09/09/2014 1357   CL 108* 09/09/2014 1357   CO2 25 09/09/2014 1357   GLUCOSE 173* 09/09/2014 1357   BUN 41* 09/09/2014 1357   CREATININE 1.47*  09/09/2014 1357   CALCIUM 8.4* 09/09/2014 1357   GFRNONAA 38* 09/09/2014 1357   GFRAA 46* 09/09/2014 1357    No results found for: SPEP, UPEP  Lab Results  Component Value Date   WBC 6.9 07/15/2015   NEUTROABS 3.9 07/15/2015   HGB 9.2* 09/02/2015   HCT 28.0* 09/02/2015   MCV 90.9 07/15/2015   PLT 272 07/15/2015      Chemistry      Component Value Date/Time   NA 141 09/09/2014 1357   K 4.3 09/09/2014 1357   CL 108* 09/09/2014 1357   CO2 25 09/09/2014 1357   BUN 41* 09/09/2014 1357   CREATININE 1.47* 09/09/2014 1357      Component Value Date/Time   CALCIUM 8.4* 09/09/2014 1357       ASSESSMENT & PLAN:   # Chronic anemia- likely from chronic kidney disease. Patient has been on Aranesp 20,000 units every week 4- hemoglobin has gone up from 8.5 to 9-10. Symptomatically patient feels better.  # hemoglobin and hematocrit from today is pending. Continue Procrit as protocol; however patient concerned about frequent visits/missing work/and also cost issues- recommend Procrit 40,000 units every 2 weeks. New orders written.  # patient follow-up with [place] in approximately 3 months.      [name], MD 09/16/2015 4:06 PM

## 2015-09-17 ENCOUNTER — Ambulatory Visit: Payer: Managed Care, Other (non HMO)

## 2015-09-18 ENCOUNTER — Ambulatory Visit: Payer: Managed Care, Other (non HMO)

## 2015-09-19 ENCOUNTER — Inpatient Hospital Stay: Payer: Managed Care, Other (non HMO) | Attending: Internal Medicine

## 2015-09-19 DIAGNOSIS — Z79899 Other long term (current) drug therapy: Secondary | ICD-10-CM | POA: Insufficient documentation

## 2015-09-19 DIAGNOSIS — D631 Anemia in chronic kidney disease: Secondary | ICD-10-CM | POA: Insufficient documentation

## 2015-09-19 DIAGNOSIS — N183 Chronic kidney disease, stage 3 (moderate): Secondary | ICD-10-CM | POA: Insufficient documentation

## 2015-09-19 DIAGNOSIS — I129 Hypertensive chronic kidney disease with stage 1 through stage 4 chronic kidney disease, or unspecified chronic kidney disease: Secondary | ICD-10-CM | POA: Insufficient documentation

## 2015-09-30 ENCOUNTER — Inpatient Hospital Stay: Payer: Managed Care, Other (non HMO)

## 2015-09-30 ENCOUNTER — Ambulatory Visit: Payer: Managed Care, Other (non HMO)

## 2015-09-30 ENCOUNTER — Other Ambulatory Visit: Payer: Managed Care, Other (non HMO)

## 2015-09-30 DIAGNOSIS — Z79899 Other long term (current) drug therapy: Secondary | ICD-10-CM | POA: Diagnosis not present

## 2015-09-30 DIAGNOSIS — D631 Anemia in chronic kidney disease: Secondary | ICD-10-CM

## 2015-09-30 DIAGNOSIS — N183 Chronic kidney disease, stage 3 (moderate): Secondary | ICD-10-CM | POA: Diagnosis not present

## 2015-09-30 DIAGNOSIS — I129 Hypertensive chronic kidney disease with stage 1 through stage 4 chronic kidney disease, or unspecified chronic kidney disease: Secondary | ICD-10-CM | POA: Diagnosis present

## 2015-09-30 DIAGNOSIS — N189 Chronic kidney disease, unspecified: Principal | ICD-10-CM

## 2015-09-30 LAB — HEMATOCRIT: HEMATOCRIT: 31.1 % — AB (ref 35.0–47.0)

## 2015-09-30 LAB — HEMOGLOBIN: HEMOGLOBIN: 10 g/dL — AB (ref 12.0–16.0)

## 2015-09-30 MED ORDER — EPOETIN ALFA 40000 UNIT/ML IJ SOLN: 40000.0000 [IU] | Freq: Once | INTRAMUSCULAR | Status: DC

## 2015-10-01 ENCOUNTER — Inpatient Hospital Stay: Payer: Managed Care, Other (non HMO)

## 2015-10-01 VITALS — BP 164/80 | HR 83

## 2015-10-01 DIAGNOSIS — N189 Chronic kidney disease, unspecified: Principal | ICD-10-CM

## 2015-10-01 DIAGNOSIS — I129 Hypertensive chronic kidney disease with stage 1 through stage 4 chronic kidney disease, or unspecified chronic kidney disease: Secondary | ICD-10-CM | POA: Diagnosis not present

## 2015-10-01 DIAGNOSIS — D631 Anemia in chronic kidney disease: Secondary | ICD-10-CM

## 2015-10-01 MED ORDER — EPOETIN ALFA 40000 UNIT/ML IJ SOLN
40000.0000 [IU] | Freq: Once | INTRAMUSCULAR | Status: AC
  Administered 2015-10-01: 40000 [IU] via SUBCUTANEOUS
  Filled 2015-10-01: qty 1

## 2015-10-28 ENCOUNTER — Inpatient Hospital Stay: Payer: Managed Care, Other (non HMO)

## 2015-10-28 ENCOUNTER — Ambulatory Visit: Payer: Managed Care, Other (non HMO)

## 2015-10-28 ENCOUNTER — Other Ambulatory Visit: Payer: Managed Care, Other (non HMO)

## 2015-10-31 ENCOUNTER — Other Ambulatory Visit: Payer: Self-pay | Admitting: Internal Medicine

## 2015-10-31 ENCOUNTER — Inpatient Hospital Stay: Payer: Managed Care, Other (non HMO)

## 2015-10-31 ENCOUNTER — Inpatient Hospital Stay: Payer: Managed Care, Other (non HMO) | Attending: Internal Medicine

## 2015-10-31 DIAGNOSIS — N189 Chronic kidney disease, unspecified: Secondary | ICD-10-CM | POA: Diagnosis not present

## 2015-10-31 DIAGNOSIS — I129 Hypertensive chronic kidney disease with stage 1 through stage 4 chronic kidney disease, or unspecified chronic kidney disease: Secondary | ICD-10-CM | POA: Diagnosis not present

## 2015-10-31 DIAGNOSIS — D631 Anemia in chronic kidney disease: Secondary | ICD-10-CM | POA: Diagnosis not present

## 2015-10-31 DIAGNOSIS — Z79899 Other long term (current) drug therapy: Secondary | ICD-10-CM | POA: Insufficient documentation

## 2015-10-31 LAB — HEMOGLOBIN: HEMOGLOBIN: 10.6 g/dL — AB (ref 12.0–16.0)

## 2015-10-31 LAB — HEMATOCRIT: HCT: 32.8 % — ABNORMAL LOW (ref 35.0–47.0)

## 2015-10-31 NOTE — Progress Notes (Signed)
Pt in for procrit injection. BP too high. Pt to reschedule for tomorrow

## 2015-11-01 ENCOUNTER — Inpatient Hospital Stay: Payer: Managed Care, Other (non HMO)

## 2015-11-01 VITALS — BP 159/83 | HR 54 | Temp 97.0°F | Resp 97

## 2015-11-01 DIAGNOSIS — I129 Hypertensive chronic kidney disease with stage 1 through stage 4 chronic kidney disease, or unspecified chronic kidney disease: Secondary | ICD-10-CM | POA: Diagnosis not present

## 2015-11-01 DIAGNOSIS — D631 Anemia in chronic kidney disease: Secondary | ICD-10-CM

## 2015-11-01 DIAGNOSIS — N189 Chronic kidney disease, unspecified: Principal | ICD-10-CM

## 2015-11-01 MED ORDER — EPOETIN ALFA 40000 UNIT/ML IJ SOLN
40000.0000 [IU] | Freq: Once | INTRAMUSCULAR | Status: AC
  Administered 2015-11-01: 40000 [IU] via SUBCUTANEOUS
  Filled 2015-11-01: qty 1

## 2015-11-11 ENCOUNTER — Inpatient Hospital Stay: Payer: Managed Care, Other (non HMO)

## 2015-11-11 ENCOUNTER — Ambulatory Visit: Payer: Managed Care, Other (non HMO)

## 2015-11-11 ENCOUNTER — Other Ambulatory Visit: Payer: Managed Care, Other (non HMO)

## 2015-11-25 ENCOUNTER — Ambulatory Visit: Payer: Managed Care, Other (non HMO)

## 2015-11-25 ENCOUNTER — Inpatient Hospital Stay: Payer: Managed Care, Other (non HMO) | Attending: Internal Medicine

## 2015-11-25 ENCOUNTER — Inpatient Hospital Stay: Payer: Managed Care, Other (non HMO)

## 2015-11-25 ENCOUNTER — Other Ambulatory Visit: Payer: Managed Care, Other (non HMO)

## 2015-11-25 VITALS — BP 182/95 | HR 70 | Resp 20

## 2015-11-25 DIAGNOSIS — D631 Anemia in chronic kidney disease: Secondary | ICD-10-CM | POA: Diagnosis not present

## 2015-11-25 DIAGNOSIS — I129 Hypertensive chronic kidney disease with stage 1 through stage 4 chronic kidney disease, or unspecified chronic kidney disease: Secondary | ICD-10-CM | POA: Insufficient documentation

## 2015-11-25 DIAGNOSIS — N189 Chronic kidney disease, unspecified: Secondary | ICD-10-CM

## 2015-11-25 DIAGNOSIS — N183 Chronic kidney disease, stage 3 (moderate): Secondary | ICD-10-CM | POA: Insufficient documentation

## 2015-11-25 LAB — HEMOGLOBIN: Hemoglobin: 10.6 g/dL — ABNORMAL LOW (ref 12.0–16.0)

## 2015-11-25 LAB — HEMATOCRIT: HEMATOCRIT: 31.7 % — AB (ref 35.0–47.0)

## 2015-11-25 MED ORDER — EPOETIN ALFA 40000 UNIT/ML IJ SOLN: 40000.0000 [IU] | Freq: Once | INTRAMUSCULAR | Status: DC

## 2015-12-18 ENCOUNTER — Inpatient Hospital Stay: Payer: Managed Care, Other (non HMO) | Attending: Internal Medicine

## 2015-12-18 ENCOUNTER — Inpatient Hospital Stay (HOSPITAL_BASED_OUTPATIENT_CLINIC_OR_DEPARTMENT_OTHER): Payer: Managed Care, Other (non HMO) | Admitting: Internal Medicine

## 2015-12-18 ENCOUNTER — Inpatient Hospital Stay: Payer: Managed Care, Other (non HMO)

## 2015-12-18 VITALS — BP 161/83 | HR 69 | Temp 98.4°F | Resp 18 | Ht 66.0 in | Wt 221.6 lb

## 2015-12-18 VITALS — BP 155/80 | HR 65 | Resp 18

## 2015-12-18 DIAGNOSIS — D631 Anemia in chronic kidney disease: Secondary | ICD-10-CM

## 2015-12-18 DIAGNOSIS — R0602 Shortness of breath: Secondary | ICD-10-CM | POA: Diagnosis not present

## 2015-12-18 DIAGNOSIS — Z794 Long term (current) use of insulin: Secondary | ICD-10-CM | POA: Insufficient documentation

## 2015-12-18 DIAGNOSIS — Z79899 Other long term (current) drug therapy: Secondary | ICD-10-CM | POA: Insufficient documentation

## 2015-12-18 DIAGNOSIS — R030 Elevated blood-pressure reading, without diagnosis of hypertension: Secondary | ICD-10-CM

## 2015-12-18 DIAGNOSIS — R5383 Other fatigue: Secondary | ICD-10-CM

## 2015-12-18 DIAGNOSIS — M7989 Other specified soft tissue disorders: Secondary | ICD-10-CM

## 2015-12-18 DIAGNOSIS — D509 Iron deficiency anemia, unspecified: Secondary | ICD-10-CM

## 2015-12-18 DIAGNOSIS — D649 Anemia, unspecified: Secondary | ICD-10-CM

## 2015-12-18 DIAGNOSIS — N189 Chronic kidney disease, unspecified: Secondary | ICD-10-CM | POA: Insufficient documentation

## 2015-12-18 LAB — HEMATOCRIT: HEMATOCRIT: 27.3 % — AB (ref 35.0–47.0)

## 2015-12-18 LAB — HEMOGLOBIN: Hemoglobin: 9.2 g/dL — ABNORMAL LOW (ref 12.0–16.0)

## 2015-12-18 MED ORDER — EPOETIN ALFA 40000 UNIT/ML IJ SOLN
40000.0000 [IU] | Freq: Once | INTRAMUSCULAR | Status: AC
  Administered 2015-12-18: 40000 [IU] via SUBCUTANEOUS
  Filled 2015-12-18: qty 1

## 2015-12-18 NOTE — Progress Notes (Signed)
[place] OFFICE PROGRESS NOTE  Patient Care Team: Lottie Mussel III, MD as PCP - General (Internal Medicine) [name], MD as Consulting Physician (Internal Medicine) [name], MD as Consulting Physician (Hematology and Oncology)  HPI  SUMMARY of HEMATOLOGIC HISTORY:   # CHRONIC ANEMIA; SEP 2016-Hb-9.4 Urine PEP- NEG; K/L= 3.45 [N=1.65]; NOV 2014-M protein 0.2gm [IgG+IgA]; Jan 2015 Colonoscopy/EGD with Dr.Oh Gita Kudo in 5 years]; SEP 2016- START PROCRIT 20K units qW x4;PR;  NOV 28th START PROCRIT 40 K q 2W  # CKD [SEP 2016-creat ~2; Proteinuria 3.3 gm/dL; Dr.Kolluru]  INTERVAL HISTORY:  Very pleasant 65 year old  female patient with a long-standing history of anemia and also chronic renal insufficiency is here for follow-up; start on Procrit in September 2016 is here for follow-up.  Patient continues to have chronic shortness of breath/fatigue. She has had to miss Procrit because of elevated blood pressure intermittently. She continues to and any blood in stools black stools. She also complains of swelling in the swelling in the legs.  Patient has been Prepped for dialysis in future. No immediate plans for dialysis at this time.   REVIEW OF SYSTEMS:  A complete 10 point review of system is done which is negative except mentioned above in history of present illness  I have reviewed the past medical history, past surgical history, social history and family history with the patient and they are unchanged from previous note unless stated above.  ALLERGIES:  has No Known Allergies.  MEDICATIONS:  Current Outpatient Prescriptions  Medication Sig Dispense Refill  . acetaminophen (TYLENOL) 500 MG tablet Take 500 mg by mouth 2 (two) times a week.    . calcium carbonate (TUMS EX) 750 MG chewable tablet Chew 300 mg by mouth as needed. For heartburn    . enalapril (VASOTEC) 10 MG tablet Take 10 mg by mouth daily.    . hydrALAZINE (APRESOLINE) 25 MG tablet  Take 25 mg by mouth 2 (two) times daily.    . insulin aspart (NOVOLOG FLEXPEN) 100 UNIT/ML FlexPen Inject 12 Units into the skin 3 (three) times daily.    . insulin glargine (LANTUS) 100 UNIT/ML injection Inject 25 Units into the skin at bedtime. 25- 30 units at bedtime depending on glucose serum levels    . Insulin Pen Needle (PEN NEEDLES 31GX5/16") 31G X 8 MM MISC Use for insulin injection TID    . levothyroxine (SYNTHROID, LEVOTHROID) 137 MCG tablet Take 1 tablet by mouth daily. Take on empty stomach 30 to 60 minutes before breakfast    . meclizine (ANTIVERT) 25 MG tablet Take 12.5 mg by mouth 3 (three) times daily.    . metoprolol succinate (TOPROL-XL) 25 MG 24 hr tablet Take 25 mg by mouth 2 (two) times daily.    . Multiple Vitamins-Minerals (CENTRUM SILVER ADULT 50+ PO) Take 1 tablet by mouth daily.    . simvastatin (ZOCOR) 40 MG tablet Take 40 mg by mouth 2 (two) times a week.     No current facility-administered medications for this visit.    PHYSICAL EXAMINATION:   BP 161/83 mmHg  Pulse 69  Temp(Src) 98.4 F (36.9 C) (Tympanic)  Resp 18  Ht 5\' 6"  (1.676 m)  Wt 221 lb 9 oz (100.5 kg)  BMI 35.78 kg/m2  Filed Weights   12/18/15 1424  Weight: 221 lb 9 oz (100.5 kg)    GENERAL:alert, no distress and comfortable. She is alone.  EYES: positive for pallor OROPHARYNX: good dentition. No thrush or ulceration.  NECK: No thyromegaly LYMPH:  no palpable lymphadenopathy in the cervical, axillary or inguinal LUNGS: clear to auscultation with normal breathing effort; No Wheeze or crackles  Cardio-vascular- Regular Rate & Rythm and no murmurs; positive for edema 1 + plus Bil.  ABDOMEN:abdomen soft, non-tender and normal bowel sounds; No hepato-splenomegaly.  Musculoskeletal:no cyanosis of digits and no clubbing  NEURO: alert & oriented x 3 with fluent speech, no focal motor/sensory deficits. SKIN: no skin rash; positive for pallor  LABORATORY DATA:  I have reviewed the data as  listed    Component Value Date/Time   NA 141 09/09/2014 1357   K 4.3 09/09/2014 1357   CL 108* 09/09/2014 1357   CO2 25 09/09/2014 1357   GLUCOSE 173* 09/09/2014 1357   BUN 41* 09/09/2014 1357   CREATININE 1.47* 09/09/2014 1357   CALCIUM 8.4* 09/09/2014 1357   GFRNONAA 38* 09/09/2014 1357   GFRAA 46* 09/09/2014 1357    No results found for: SPEP, UPEP  Lab Results  Component Value Date   WBC 6.9 07/15/2015   NEUTROABS 3.9 07/15/2015   HGB 9.2* 12/18/2015   HCT 27.3* 12/18/2015   MCV 90.9 07/15/2015   PLT 272 07/15/2015      Chemistry      Component Value Date/Time   NA 141 09/09/2014 1357   K 4.3 09/09/2014 1357   CL 108* 09/09/2014 1357   CO2 25 09/09/2014 1357   BUN 41* 09/09/2014 1357   CREATININE 1.47* 09/09/2014 1357      Component Value Date/Time   CALCIUM 8.4* 09/09/2014 1357       ASSESSMENT & PLAN:   # Chronic anemia- likely from chronic kidney disease.  Patient is on Procrit 40,000 units every 2 weeks;  However having difficulty getting Procrit because of blood pressures have been high.  hemoglobin has gone up from 8.5 to 9-10. Today hemoglobin is 9.2. Okay to her Procrit today.  # we'll check iron studies at 2 weeks/labs- and add IV iron if needed-during the subsequent visits.  # elevated blood pressure- monitored by nephrology.  # patient follow-up with [place] in approximately 3 months. Hemoglobin hematocrit iron studies ferritin.       [name], MD 12/18/2015 2:42 PM

## 2015-12-18 NOTE — Progress Notes (Signed)
Feels more tired but about the same. She has been working with kidney doctor to try to get b/p down. She hurts in her back and kidney doctor says it is kidneys getting worse and he needs to control the b/p better. No Blood in urine and stool.

## 2016-01-01 ENCOUNTER — Inpatient Hospital Stay: Payer: Managed Care, Other (non HMO)

## 2016-01-01 ENCOUNTER — Other Ambulatory Visit: Payer: Self-pay | Admitting: Internal Medicine

## 2016-01-01 VITALS — BP 165/84 | HR 65 | Resp 18

## 2016-01-01 DIAGNOSIS — D509 Iron deficiency anemia, unspecified: Secondary | ICD-10-CM

## 2016-01-01 DIAGNOSIS — D631 Anemia in chronic kidney disease: Secondary | ICD-10-CM

## 2016-01-01 DIAGNOSIS — N189 Chronic kidney disease, unspecified: Principal | ICD-10-CM

## 2016-01-01 DIAGNOSIS — D649 Anemia, unspecified: Secondary | ICD-10-CM | POA: Diagnosis not present

## 2016-01-01 LAB — FERRITIN: Ferritin: 212 ng/mL (ref 11–307)

## 2016-01-01 LAB — IRON AND TIBC
Iron: 40 ug/dL (ref 28–170)
Saturation Ratios: 17 % (ref 10.4–31.8)
TIBC: 234 ug/dL — AB (ref 250–450)
UIBC: 194 ug/dL

## 2016-01-01 LAB — HEMATOCRIT: HEMATOCRIT: 28 % — AB (ref 35.0–47.0)

## 2016-01-01 LAB — HEMOGLOBIN: HEMOGLOBIN: 9.2 g/dL — AB (ref 12.0–16.0)

## 2016-01-01 MED ORDER — EPOETIN ALFA 40000 UNIT/ML IJ SOLN
40000.0000 [IU] | Freq: Once | INTRAMUSCULAR | Status: AC
  Administered 2016-01-01: 40000 [IU] via SUBCUTANEOUS
  Filled 2016-01-01: qty 1

## 2016-01-01 NOTE — Progress Notes (Signed)
BP 165/84. MD was contacted by nursing to determine if procrit should be given. MD ok with giving procrit with current BP.

## 2016-01-01 NOTE — Progress Notes (Signed)
Dr. Burlene Arnt informed of patients BP.  He gave order to give procrit based on last bp of 165/84.

## 2016-01-03 ENCOUNTER — Inpatient Hospital Stay: Payer: Managed Care, Other (non HMO)

## 2016-01-03 VITALS — BP 177/87 | HR 81 | Temp 96.9°F | Resp 18

## 2016-01-03 DIAGNOSIS — D649 Anemia, unspecified: Secondary | ICD-10-CM | POA: Diagnosis not present

## 2016-01-03 DIAGNOSIS — D509 Iron deficiency anemia, unspecified: Secondary | ICD-10-CM

## 2016-01-03 MED ORDER — SODIUM CHLORIDE 0.9 % IV SOLN
INTRAVENOUS | Status: DC
  Administered 2016-01-03: 15:00:00 via INTRAVENOUS
  Filled 2016-01-03: qty 1000

## 2016-01-03 MED ORDER — SODIUM CHLORIDE 0.9 % IV SOLN
510.0000 mg | Freq: Once | INTRAVENOUS | Status: AC
  Administered 2016-01-03: 510 mg via INTRAVENOUS
  Filled 2016-01-03: qty 17

## 2016-01-14 ENCOUNTER — Inpatient Hospital Stay: Payer: Managed Care, Other (non HMO)

## 2016-01-14 VITALS — BP 161/78 | HR 65 | Temp 97.0°F | Resp 18

## 2016-01-14 DIAGNOSIS — D509 Iron deficiency anemia, unspecified: Secondary | ICD-10-CM

## 2016-01-14 DIAGNOSIS — D631 Anemia in chronic kidney disease: Secondary | ICD-10-CM

## 2016-01-14 DIAGNOSIS — N189 Chronic kidney disease, unspecified: Principal | ICD-10-CM

## 2016-01-14 DIAGNOSIS — D649 Anemia, unspecified: Secondary | ICD-10-CM | POA: Diagnosis not present

## 2016-01-14 LAB — HEMOGLOBIN: HEMOGLOBIN: 9.8 g/dL — AB (ref 12.0–16.0)

## 2016-01-14 LAB — HEMATOCRIT: HEMATOCRIT: 30.3 % — AB (ref 35.0–47.0)

## 2016-01-14 MED ORDER — EPOETIN ALFA 40000 UNIT/ML IJ SOLN
40000.0000 [IU] | Freq: Once | INTRAMUSCULAR | Status: AC
  Administered 2016-01-14: 40000 [IU] via SUBCUTANEOUS
  Filled 2016-01-14: qty 1

## 2016-01-14 NOTE — Progress Notes (Signed)
Dr. Gildardo Griffes gave verbal order to give patient procrit based on bp of 161/78.

## 2016-01-15 ENCOUNTER — Inpatient Hospital Stay: Payer: Managed Care, Other (non HMO)

## 2016-01-22 ENCOUNTER — Ambulatory Visit
Admission: RE | Admit: 2016-01-22 | Discharge: 2016-01-22 | Disposition: A | Discharge status: Home / Self Care | Payer: Managed Care, Other (non HMO) | Source: Ambulatory Visit | Attending: Vascular Surgery | Admitting: Vascular Surgery

## 2016-01-22 ENCOUNTER — Other Ambulatory Visit: Payer: Self-pay | Admitting: Vascular Surgery

## 2016-01-22 ENCOUNTER — Encounter
Admission: RE | Disposition: A | Discharge status: Home / Self Care | Payer: Self-pay | Source: Ambulatory Visit | Attending: Vascular Surgery

## 2016-01-22 DIAGNOSIS — Z90710 Acquired absence of both cervix and uterus: Secondary | ICD-10-CM | POA: Diagnosis not present

## 2016-01-22 DIAGNOSIS — M545 Low back pain: Secondary | ICD-10-CM | POA: Diagnosis not present

## 2016-01-22 DIAGNOSIS — N186 End stage renal disease: Secondary | ICD-10-CM | POA: Insufficient documentation

## 2016-01-22 DIAGNOSIS — E11319 Type 2 diabetes mellitus with unspecified diabetic retinopathy without macular edema: Secondary | ICD-10-CM | POA: Insufficient documentation

## 2016-01-22 DIAGNOSIS — D631 Anemia in chronic kidney disease: Secondary | ICD-10-CM | POA: Insufficient documentation

## 2016-01-22 DIAGNOSIS — Z794 Long term (current) use of insulin: Secondary | ICD-10-CM | POA: Diagnosis not present

## 2016-01-22 DIAGNOSIS — Z833 Family history of diabetes mellitus: Secondary | ICD-10-CM | POA: Insufficient documentation

## 2016-01-22 DIAGNOSIS — Z9049 Acquired absence of other specified parts of digestive tract: Secondary | ICD-10-CM | POA: Diagnosis not present

## 2016-01-22 DIAGNOSIS — M1990 Unspecified osteoarthritis, unspecified site: Secondary | ICD-10-CM | POA: Diagnosis not present

## 2016-01-22 DIAGNOSIS — N2581 Secondary hyperparathyroidism of renal origin: Secondary | ICD-10-CM | POA: Insufficient documentation

## 2016-01-22 DIAGNOSIS — E039 Hypothyroidism, unspecified: Secondary | ICD-10-CM | POA: Diagnosis not present

## 2016-01-22 DIAGNOSIS — L97529 Non-pressure chronic ulcer of other part of left foot with unspecified severity: Secondary | ICD-10-CM | POA: Diagnosis not present

## 2016-01-22 DIAGNOSIS — Z841 Family history of disorders of kidney and ureter: Secondary | ICD-10-CM | POA: Diagnosis not present

## 2016-01-22 DIAGNOSIS — Z9889 Other specified postprocedural states: Secondary | ICD-10-CM | POA: Insufficient documentation

## 2016-01-22 DIAGNOSIS — E1122 Type 2 diabetes mellitus with diabetic chronic kidney disease: Secondary | ICD-10-CM | POA: Diagnosis not present

## 2016-01-22 DIAGNOSIS — I739 Peripheral vascular disease, unspecified: Secondary | ICD-10-CM | POA: Insufficient documentation

## 2016-01-22 DIAGNOSIS — K219 Gastro-esophageal reflux disease without esophagitis: Secondary | ICD-10-CM | POA: Insufficient documentation

## 2016-01-22 DIAGNOSIS — I120 Hypertensive chronic kidney disease with stage 5 chronic kidney disease or end stage renal disease: Secondary | ICD-10-CM | POA: Insufficient documentation

## 2016-01-22 DIAGNOSIS — E1140 Type 2 diabetes mellitus with diabetic neuropathy, unspecified: Secondary | ICD-10-CM | POA: Diagnosis not present

## 2016-01-22 HISTORY — PX: PERIPHERAL VASCULAR CATHETERIZATION: SHX172C

## 2016-01-22 LAB — POTASSIUM (ARMC VASCULAR LAB ONLY): POTASSIUM (ARMC VASCULAR LAB): 5.1 (ref 3.5–5.1)

## 2016-01-22 SURGERY — DIALYSIS/PERMA CATHETER INSERTION
Anesthesia: Moderate Sedation

## 2016-01-22 MED ORDER — FENTANYL CITRATE (PF) 100 MCG/2ML IJ SOLN
INTRAMUSCULAR | Status: DC | PRN
  Administered 2016-01-22 (×2): 50 ug via INTRAVENOUS

## 2016-01-22 MED ORDER — FENTANYL CITRATE (PF) 100 MCG/2ML IJ SOLN
INTRAMUSCULAR | Status: AC
  Filled 2016-01-22: qty 2

## 2016-01-22 MED ORDER — LIDOCAINE-EPINEPHRINE (PF) 1 %-1:200000 IJ SOLN
INTRAMUSCULAR | Status: AC
  Filled 2016-01-22: qty 30

## 2016-01-22 MED ORDER — MIDAZOLAM HCL 2 MG/2ML IJ SOLN
INTRAMUSCULAR | Status: DC | PRN
  Administered 2016-01-22: 2 mg via INTRAVENOUS
  Administered 2016-01-22: 1 mg via INTRAVENOUS

## 2016-01-22 MED ORDER — CEFUROXIME SODIUM 1.5 G IJ SOLR
1.5000 g | INTRAMUSCULAR | Status: AC
  Administered 2016-01-22: 1.5 g via INTRAVENOUS

## 2016-01-22 MED ORDER — MIDAZOLAM HCL 5 MG/5ML IJ SOLN
INTRAMUSCULAR | Status: AC
  Filled 2016-01-22: qty 5

## 2016-01-22 MED ORDER — FAMOTIDINE 20 MG PO TABS: 40.0000 mg | ORAL_TABLET | ORAL | Status: DC | PRN

## 2016-01-22 MED ORDER — ONDANSETRON HCL 4 MG/2ML IJ SOLN: 4.0000 mg | Freq: Four times a day (QID) | INTRAMUSCULAR | Status: DC | PRN

## 2016-01-22 MED ORDER — METHYLPREDNISOLONE SODIUM SUCC 125 MG IJ SOLR: 125.0000 mg | INTRAMUSCULAR | Status: DC | PRN

## 2016-01-22 MED ORDER — ONDANSETRON 4 MG PO TBDP
ORAL_TABLET | ORAL | Status: AC
  Administered 2016-01-22: 4 mg via ORAL
  Filled 2016-01-22: qty 1

## 2016-01-22 MED ORDER — HYDROMORPHONE HCL 1 MG/ML IJ SOLN
1.0000 mg | Freq: Once | INTRAMUSCULAR | Status: AC
  Administered 2016-01-22: 1 mg via INTRAVENOUS

## 2016-01-22 MED ORDER — ONDANSETRON 4 MG PO TBDP
4.0000 mg | ORAL_TABLET | Freq: Once | ORAL | Status: AC
  Administered 2016-01-22: 4 mg via ORAL

## 2016-01-22 MED ORDER — HYDROMORPHONE HCL 1 MG/ML IJ SOLN
INTRAMUSCULAR | Status: AC
  Filled 2016-01-22: qty 1

## 2016-01-22 MED ORDER — SODIUM CHLORIDE 0.9 % IV SOLN
INTRAVENOUS | Status: DC
  Administered 2016-01-22: 10:00:00 via INTRAVENOUS

## 2016-01-22 MED ORDER — HEPARIN SODIUM (PORCINE) 10000 UNIT/ML IJ SOLN
INTRAMUSCULAR | Status: AC
  Filled 2016-01-22: qty 1

## 2016-01-22 MED ORDER — HEPARIN (PORCINE) IN NACL 2-0.9 UNIT/ML-% IJ SOLN
INTRAMUSCULAR | Status: AC
  Filled 2016-01-22: qty 500

## 2016-01-22 SURGICAL SUPPLY — 7 items
CATH PALINDROME RT-P 15FX19CM (CATHETERS) ×3 IMPLANT
DRAPE INCISE IOBAN 66X45 STRL (DRAPES) ×3 IMPLANT
PACK ANGIOGRAPHY (CUSTOM PROCEDURE TRAY) ×3 IMPLANT
SET INTRO CAPELLA COAXIAL (SET/KITS/TRAYS/PACK) ×3 IMPLANT
SUT MNCRL AB 4-0 PS2 18 (SUTURE) ×3 IMPLANT
SUT SILK 0 FSL (SUTURE) ×3 IMPLANT
TOWEL OR 17X26 4PK STRL BLUE (TOWEL DISPOSABLE) ×3 IMPLANT

## 2016-01-22 NOTE — Op Note (Signed)
[facility]   OPERATIVE NOTE     PROCEDURE: 1. Insertion Right IJ tunneled dialysis catheter placement 2. Catheter placement and cannulation under ultrasound and fluoroscopic guidance  PRE-OPERATIVE DIAGNOSIS: end-stage renal requiring hemodialysis  POST-OPERATIVE DIAGNOSIS: same as above  SURGEON: Katha Cabal, M.D.  ANESTHESIA: Conscious sedation was administered under my direct supervision. IV Versed plus fentanyl were utilized. Continuous ECG, pulse oximetry and blood pressure was monitored throughout the entire procedure. Versed and  fentanyl were utilized.  Conscious sedation was for a total of 40 minutes.  ESTIMATED BLOOD LOSS: Minimal  FINDING(S): 1.  Tips of the catheter in the right atrium on fluoroscopy 2.  No obvious pneumothorax on fluoroscopy  SPECIMEN(S):  none  INDICATIONS:   Kendra [name] is a 65 y.o. female  presents with end stage renal disease.  Therefore, the patient requires a tunneled dialysis catheter placement.  The patient is informed of  the risks catheter placement include but are not limited to: bleeding, infection, central venous injury, pneumothorax, possible venous stenosis, possible malpositioning in the venous system, and possible infections related to long-term catheter presence.  The patient was aware of these risks and agreed to proceed.  DESCRIPTION: The patient was taken back to Special Procedure suite.  Prior to sedation, the patient was given IV antibiotics.  After obtaining adequate sedation, the patient was prepped and draped in the standard fashion for a chest or neck tunneled dialysis catheter placement.  Appropriate Time Out is called.   The the neck and chest wall are then infiltrated with 1% Lidocaine with epinepherine.  A 19 cm tip to cuff palindrome catheter is then selected, opened on the back table and prepped. Under ultrasound guidance, the right internal jugular vein was cannulated with the Seldinger  needle.  A J-wire was then advanced under fluoroscopic guidance into the inferior vena cava and the wire was secured.  Small counter incision was then made at the wire insertion site. A small pocket was fashioned with blunt dissection to allow easier passage of the cuff.  The dilator and peel-away sheath are then advanced over the wire under fluoroscopic guidance. The catheters and advanced through the peel-away sheath after removal of the wire. It is approximated to the chest wall after verifying the tips at the atrial caval junction and an exit site is selected.  Small incision is made at the selected exit site and the tunneling device was passed subcutaneously to the neck counter incision. Catheter is then pulled through the subcutaneous tunnel. The catheter is then verified for tip position under fluoroscopy, transected and the hub assembly connected.    Each port was tested by aspirating and flushing.  No resistance was noted.  Each port was then thoroughly flushed with heparinized saline.  The catheter was secured in placed with two interrupted stitches of 0 silk tied to the catheter.  The neck incision was closed with a U-stitch of 4-0 Monocryl.  The neck and chest incision were cleaned and sterile bandages applied including a Biopatch.  Each port was then packed with concentrated heparin (1000 Units/mL) at the manufacturer recommended volumes to each port.  Sterile caps were applied to each port.  On completion fluoroscopy, the tips of the catheter were in the right atrium, and there was no evidence of pneumothorax.  COMPLICATIONS: None  CONDITION: Good   Katha Cabal M.D. [facility] Office: [phone]   01/22/2016, 12:49 PM

## 2016-01-22 NOTE — Progress Notes (Signed)
Procedure done, pt tolerated well, vss throughout entire procedure.

## 2016-01-22 NOTE — H&P (Signed)
[facility] Admission History & Physical  MRN : [id number]  Kendra [name] is a 65 y.o. (06-20-1951) female who presents with chief complaint of my kidneys are worse.  History of Present Illness: Patient is followed by [facility] kidney. I am asked by Dr. Juleen China to assess the patient for urgent need for dialysis and appropriate access. The patient was recently seen in the emergency room at [facility] secondary to worsening peripheral edema. She also noted right hip and lower back pain but was found to have a markedly elevated blood pressure. By her admission she has had decreasing appetite some increasing nausea and in general feels much more fatigued. In recent discussion with her nephrologist she is willing to start dialysis.  Current Facility-Administered Medications  Medication Dose Route Frequency Provider Last Rate Last Dose  . 0.9 %  sodium chloride infusion   Intravenous Continuous Kimberly A Stegmayer, PA-C 10 mL/hr at 01/22/16 1023    . cefUROXime (ZINACEF) 1.5 g in dextrose 5 % 50 mL IVPB  1.5 g Intravenous 30 min Pre-Op Kimberly A Stegmayer, PA-C      . famotidine (PEPCID) tablet 40 mg  40 mg Oral PRN Janalyn Harder Stegmayer, PA-C      . HYDROmorphone (DILAUDID) 1 MG/ML injection           . methylPREDNISolone sodium succinate (SOLU-MEDROL) 125 mg/2 mL injection 125 mg  125 mg Intravenous PRN Kimberly A Stegmayer, PA-C      . ondansetron (ZOFRAN) injection 4 mg  4 mg Intravenous Q6H PRN Sela Hua, PA-C        Past Medical History  Diagnosis Date  . Anemia of chronic renal failure   . Primary hypertension   . Hyperparathyroidism, secondary renal ([place])   . GERD (gastroesophageal reflux disease)   . Insulin dependent diabetes mellitus ([place])   . Diabetic retinopathy ([place])   . Diabetic neuropathy ([place])   . Osteoarthritis     bilateral shoulders  . Hypothyroidism     Past Surgical History  Procedure Laterality Date  . Abdominal  hysterectomy    . Cholecystectomy    . Thyroidectomy      Social History Social History  Substance Use Topics  . Smoking status: Never Smoker   . Smokeless tobacco: Never Used  . Alcohol Use: No    Family History Family History  Problem Relation Age of Onset  . Renal Disease Brother     with renal transplant  . Diabetes Mellitus II Father   . Diabetes Mellitus II Mother   . Diabetes Mellitus II Brother   No family history of porphyria or autoimmune disease or bleeding clotting disorders  No Known Allergies   REVIEW OF SYSTEMS (Negative unless checked)  Constitutional: [] Weight loss  [] Fever  [] Chills Cardiac: [] Chest pain   [] Chest pressure   [] Palpitations   [] Shortness of breath when laying flat   [] Shortness of breath at rest   [x] Shortness of breath with exertion. Vascular:  [] Pain in legs with walking   [x] Pain in legs at rest   [] Pain in legs when laying flat   [] Claudication   [] Pain in feet when walking  [] Pain in feet at rest  [] Pain in feet when laying flat   [] History of DVT   [] Phlebitis   [] Swelling in legs   [] Varicose veins   [] Non-healing ulcers Pulmonary:   [] Uses home oxygen   [] Productive cough   [] Hemoptysis   [] Wheeze  [] COPD   [] Asthma Neurologic:  []   Dizziness  [] Blackouts   [] Seizures   [] History of stroke   [] History of TIA  [] Aphasia   [] Temporary blindness   [] Dysphagia   [] Weakness or numbness in arms   [] Weakness or numbness in legs Musculoskeletal:  [] Arthritis   [] Joint swelling   [x] Joint pain   [x] Low back pain Hematologic:  [] Easy bruising  [] Easy bleeding   [] Hypercoagulable state   [] Anemic  [] Hepatitis Gastrointestinal:  [] Blood in stool   [] Vomiting blood  [] Gastroesophageal reflux/heartburn   [] Difficulty swallowing. Genitourinary:  [] Chronic kidney disease   [] Difficult urination  [] Frequent urination  [] Burning with urination   [] Blood in urine Skin:  [] Rashes   [x] Ulcers   [x] Wounds Psychological:  [] History of anxiety   []  History of  major depression.  Physical Examination  Filed Vitals:   01/22/16 1015  BP: 179/107  Pulse: 88  Temp: 98.6 F (37 C)  TempSrc: Oral  Resp: 18  Height: 5\' 6"  (1.676 m)  Weight: 98.431 kg (217 lb)  SpO2: 97%   Body mass index is 35.04 kg/(m^2). Gen: WD/WN, NAD Head: [place]/AT, No temporalis wasting. Prominent temp pulse not noted. Ear/Nose/Throat: Hearing grossly intact, nares w/o erythema or drainage, oropharynx w/o Erythema/Exudate,  Eyes: PERRLA, EOMI.  Neck: Supple, no nuchal rigidity.  No bruit or JVD.  Pulmonary:  Good air movement, clear to auscultation bilaterally, no use of accessory muscles.  Cardiac: RRR, normal S1, S2, no Murmurs, rubs or gallops. Vascular: Ulceration left foot uninfected Vessel Right Left  Radial Palpable Palpable  Ulnar Palpable Palpable  Brachial Palpable Palpable  Carotid Palpable, without bruit Palpable, without bruit  Aorta Not palpable N/A  Femoral Palpable Palpable  Popliteal Not Palpable Not Palpable  PT Not Palpable Not Palpable  DP Not Palpable Not Palpable   Gastrointestinal: soft, non-tender/non-distended. No guarding/reflex.  Musculoskeletal: M/S 5/5 throughout.  Extremities without ischemic changes.  No deformity or atrophy.  Neurologic: CN 2-12 intact. Pain and light touch intact in extremities.  Symmetrical.  Speech is fluent. Motor exam as listed above. Psychiatric: Judgment intact, Mood & affect appropriate for pt's clinical situation. Dermatologic: No rashes  noted.  There is an open wound left foot. Lymph : No Cervical, Axillary, or Inguinal lymphadenopathy.     CBC Lab Results  Component Value Date   WBC 6.9 07/15/2015   HGB 9.8* 01/14/2016   HCT 30.3* 01/14/2016   MCV 90.9 07/15/2015   PLT 272 07/15/2015    BMET    Component Value Date/Time   NA 141 09/09/2014 1357   K 4.3 09/09/2014 1357   CL 108* 09/09/2014 1357   CO2 25 09/09/2014 1357   GLUCOSE 173* 09/09/2014 1357   BUN 41* 09/09/2014 1357   CREATININE  1.47* 09/09/2014 1357   CALCIUM 8.4* 09/09/2014 1357   GFRNONAA 38* 09/09/2014 1357   GFRAA 46* 09/09/2014 1357   CrCl cannot be calculated (Patient has no serum creatinine result on file.).  COAG No results found for: INR, PROTIME  Radiology No results found.  Assessment/Plan 1.  End-stage renal disease requiring hemodialysis:  Patient will begin dialysis therapy without further interruption. Her foot is uninfected and therefore tunneled catheter is appropriate. Risks and benefits of been reviewed with the patient patient has agreed to proceed with catheter placement and dialysis therapy. 2.  Peripheral vascular disease and associated with ulceration left foot: At the present time her ulcers uninfected this can be worked up as outpatient noninvasive studies will be obtained when she follows up in the office. She'll also  require vein mapping for future arm access. 3.  Hypertension:  Patient will continue medical management; nephrology is following no changes in oral medications. 4. Diabetes mellitus:  Glucose will be monitored and oral medications been held this morning once the patient has undergone the patient's procedure po intake will be reinitiated and again Accu-Cheks will be used to assess the blood glucose level and treat as needed. The patient will be restarted on the patient's usual hypoglycemic regime     Schnier, Dolores Lory, MD  01/22/2016 12:05 PM

## 2016-01-22 NOTE — Progress Notes (Signed)
Patient alert and oriented. Reporting tolerable pain control. Discharge instructions reviewed with patient and sister. Episode of nausea vomiting x 1. Zofran administered with improvement.

## 2016-01-24 ENCOUNTER — Encounter: Payer: Self-pay | Admitting: Vascular Surgery

## 2016-01-29 ENCOUNTER — Inpatient Hospital Stay: Payer: Managed Care, Other (non HMO)

## 2016-01-29 ENCOUNTER — Inpatient Hospital Stay: Payer: Managed Care, Other (non HMO) | Attending: Internal Medicine

## 2016-01-29 DIAGNOSIS — I129 Hypertensive chronic kidney disease with stage 1 through stage 4 chronic kidney disease, or unspecified chronic kidney disease: Secondary | ICD-10-CM | POA: Insufficient documentation

## 2016-01-29 DIAGNOSIS — D631 Anemia in chronic kidney disease: Secondary | ICD-10-CM | POA: Insufficient documentation

## 2016-01-29 DIAGNOSIS — N189 Chronic kidney disease, unspecified: Secondary | ICD-10-CM | POA: Insufficient documentation

## 2016-01-29 DIAGNOSIS — Z79899 Other long term (current) drug therapy: Secondary | ICD-10-CM | POA: Insufficient documentation

## 2016-02-12 ENCOUNTER — Inpatient Hospital Stay: Payer: Managed Care, Other (non HMO)

## 2016-02-12 VITALS — BP 115/70 | HR 72 | Resp 16

## 2016-02-12 DIAGNOSIS — N189 Chronic kidney disease, unspecified: Secondary | ICD-10-CM

## 2016-02-12 DIAGNOSIS — I129 Hypertensive chronic kidney disease with stage 1 through stage 4 chronic kidney disease, or unspecified chronic kidney disease: Secondary | ICD-10-CM | POA: Diagnosis not present

## 2016-02-12 DIAGNOSIS — D631 Anemia in chronic kidney disease: Secondary | ICD-10-CM

## 2016-02-12 DIAGNOSIS — Z79899 Other long term (current) drug therapy: Secondary | ICD-10-CM | POA: Diagnosis not present

## 2016-02-12 DIAGNOSIS — D509 Iron deficiency anemia, unspecified: Secondary | ICD-10-CM

## 2016-02-12 LAB — HEMOGLOBIN: Hemoglobin: 10.7 g/dL — ABNORMAL LOW (ref 12.0–16.0)

## 2016-02-12 LAB — HEMATOCRIT: HCT: 32.5 % — ABNORMAL LOW (ref 35.0–47.0)

## 2016-02-12 MED ORDER — EPOETIN ALFA 40000 UNIT/ML IJ SOLN
40000.0000 [IU] | Freq: Once | INTRAMUSCULAR | Status: AC
  Administered 2016-02-12: 40000 [IU] via SUBCUTANEOUS
  Filled 2016-02-12: qty 1

## 2016-02-21 ENCOUNTER — Other Ambulatory Visit: Payer: Self-pay | Admitting: Vascular Surgery

## 2016-02-26 ENCOUNTER — Inpatient Hospital Stay: Payer: Managed Care, Other (non HMO) | Attending: Internal Medicine

## 2016-02-26 ENCOUNTER — Inpatient Hospital Stay: Payer: Managed Care, Other (non HMO)

## 2016-02-26 VITALS — BP 140/72 | HR 75 | Temp 98.2°F | Resp 18

## 2016-02-26 DIAGNOSIS — D631 Anemia in chronic kidney disease: Secondary | ICD-10-CM | POA: Insufficient documentation

## 2016-02-26 DIAGNOSIS — D509 Iron deficiency anemia, unspecified: Secondary | ICD-10-CM

## 2016-02-26 DIAGNOSIS — I129 Hypertensive chronic kidney disease with stage 1 through stage 4 chronic kidney disease, or unspecified chronic kidney disease: Secondary | ICD-10-CM | POA: Diagnosis not present

## 2016-02-26 DIAGNOSIS — Z79899 Other long term (current) drug therapy: Secondary | ICD-10-CM | POA: Insufficient documentation

## 2016-02-26 DIAGNOSIS — N189 Chronic kidney disease, unspecified: Secondary | ICD-10-CM | POA: Diagnosis not present

## 2016-02-26 LAB — HEMOGLOBIN: Hemoglobin: 10.6 g/dL — ABNORMAL LOW (ref 12.0–16.0)

## 2016-02-26 LAB — HEMATOCRIT: HEMATOCRIT: 31.9 % — AB (ref 35.0–47.0)

## 2016-02-26 MED ORDER — EPOETIN ALFA 40000 UNIT/ML IJ SOLN
40000.0000 [IU] | Freq: Once | INTRAMUSCULAR | Status: AC
  Administered 2016-02-26: 40000 [IU] via SUBCUTANEOUS
  Filled 2016-02-26: qty 1

## 2016-02-27 ENCOUNTER — Encounter
Admission: RE | Admit: 2016-02-27 | Discharge: 2016-02-27 | Disposition: A | Discharge status: Home / Self Care | Payer: Managed Care, Other (non HMO) | Source: Ambulatory Visit | Attending: Vascular Surgery | Admitting: Vascular Surgery

## 2016-02-27 DIAGNOSIS — Z01812 Encounter for preprocedural laboratory examination: Secondary | ICD-10-CM | POA: Diagnosis not present

## 2016-02-27 LAB — TYPE AND SCREEN
ABO/RH(D): A POS
Antibody Screen: NEGATIVE

## 2016-02-27 LAB — PROTIME-INR
INR: 0.9
Prothrombin Time: 12.4 seconds (ref 11.4–15.0)

## 2016-02-27 LAB — APTT: APTT: 28 s (ref 24–36)

## 2016-02-27 LAB — ABO/RH: ABO/RH(D): A POS

## 2016-02-27 NOTE — Pre-Procedure Instructions (Signed)
Component Name Value Range  EKG Ventricular Rate 87 BPM   EKG Atrial Rate 87 BPM   EKG P-R Interval 152 ms  EKG QRS Duration 82 ms  EKG Q-T Interval 378 ms  EKG QTC Calculation 454 ms  EKG Calculated P Axis 33 degrees   EKG Calculated R Axis -28 degrees   EKG Calculated T Axis 46 degrees    Result Narrative  NORMAL SINUS RHYTHM VOLTAGE CRITERIA FOR LEFT VENTRICULAR HYPERTROPHY NONSPECIFIC T WAVE ABNORMALITY ABNORMAL ECG NO PREVIOUS ECGS AVAILABLE Confirmed by ROSE-JONES, LISA ([id number]) on 01/09/2016 1:06:02 PM

## 2016-02-27 NOTE — Patient Instructions (Signed)
  Your procedure is scheduled on: 5/17 Wed. Report to Day Surgery. To find out your arrival time please call [phone] between 1PM - 3PM on Tues 5/16.  Remember: Instructions that are not followed completely may result in serious medical risk, up to and including death, or upon the discretion of your surgeon and anesthesiologist your surgery may need to be rescheduled.    __x__ 1. Do not eat food or drink liquids after midnight. No gum chewing or hard candies.     __x__ 2. No Alcohol for 24 hours before or after surgery.   ____ 3. Bring all medications with you on the day of surgery if instructed.    __x__ 4. Notify your doctor if there is any change in your medical condition     (cold, fever, infections).     Do not wear jewelry, make-up, hairpins, clips or nail polish.  Do not wear lotions, powders, or perfumes. You may wear deodorant.  Do not shave 48 hours prior to surgery. Men may shave face and neck.  Do not bring valuables to the hospital.    [facility] is not responsible for any belongings or valuables.               Contacts, dentures or bridgework may not be worn into surgery.  Leave your suitcase in the car. After surgery it may be brought to your room.  For patients admitted to the hospital, discharge time is determined by your                treatment team.   Patients discharged the day of surgery will not be allowed to drive home.   Please read over the following fact sheets that you were given:   Surgical Site Infection Prevention   ____ Take these medicines the morning of surgery with A SIP OF WATER:    1. hydralazine  2. metoprolol  3. levothyroxine  4.tramadol if needed  5.phenergan if needed  6.  ____ Fleet Enema (as directed)   __x__ Use Sage Wipes as directed  ____ Use inhalers on the day of surgery  ____ Stop metformin 2 days prior to surgery    __x__ Take 1/2 of usual insulin dose the night before surgery and none on the morning of surgery.    ____ Stop Coumadin/Plavix/aspirin on  ____ Stop Anti-inflammatories on    ____ Stop supplements until after surgery.    ____ Bring C-Pap to the hospital.

## 2016-03-04 ENCOUNTER — Ambulatory Visit: Payer: Managed Care, Other (non HMO) | Admitting: Anesthesiology

## 2016-03-04 ENCOUNTER — Encounter
Admission: RE | Disposition: A | Discharge status: Home / Self Care | Payer: Self-pay | Source: Ambulatory Visit | Attending: Vascular Surgery

## 2016-03-04 ENCOUNTER — Encounter: Payer: Self-pay | Admitting: *Deleted

## 2016-03-04 ENCOUNTER — Ambulatory Visit
Admission: RE | Admit: 2016-03-04 | Discharge: 2016-03-04 | Disposition: A | Discharge status: Home / Self Care | Payer: Managed Care, Other (non HMO) | Source: Ambulatory Visit | Attending: Vascular Surgery | Admitting: Vascular Surgery

## 2016-03-04 DIAGNOSIS — Z9049 Acquired absence of other specified parts of digestive tract: Secondary | ICD-10-CM | POA: Diagnosis not present

## 2016-03-04 DIAGNOSIS — Z841 Family history of disorders of kidney and ureter: Secondary | ICD-10-CM | POA: Diagnosis not present

## 2016-03-04 DIAGNOSIS — N2581 Secondary hyperparathyroidism of renal origin: Secondary | ICD-10-CM | POA: Insufficient documentation

## 2016-03-04 DIAGNOSIS — Z79899 Other long term (current) drug therapy: Secondary | ICD-10-CM | POA: Diagnosis not present

## 2016-03-04 DIAGNOSIS — Z90710 Acquired absence of both cervix and uterus: Secondary | ICD-10-CM | POA: Diagnosis not present

## 2016-03-04 DIAGNOSIS — Z833 Family history of diabetes mellitus: Secondary | ICD-10-CM | POA: Diagnosis not present

## 2016-03-04 DIAGNOSIS — E1140 Type 2 diabetes mellitus with diabetic neuropathy, unspecified: Secondary | ICD-10-CM | POA: Diagnosis not present

## 2016-03-04 DIAGNOSIS — E11319 Type 2 diabetes mellitus with unspecified diabetic retinopathy without macular edema: Secondary | ICD-10-CM | POA: Diagnosis not present

## 2016-03-04 DIAGNOSIS — E1122 Type 2 diabetes mellitus with diabetic chronic kidney disease: Secondary | ICD-10-CM | POA: Diagnosis not present

## 2016-03-04 DIAGNOSIS — E039 Hypothyroidism, unspecified: Secondary | ICD-10-CM | POA: Diagnosis not present

## 2016-03-04 DIAGNOSIS — Z87891 Personal history of nicotine dependence: Secondary | ICD-10-CM | POA: Insufficient documentation

## 2016-03-04 DIAGNOSIS — E7800 Pure hypercholesterolemia, unspecified: Secondary | ICD-10-CM | POA: Diagnosis not present

## 2016-03-04 DIAGNOSIS — N186 End stage renal disease: Secondary | ICD-10-CM | POA: Insufficient documentation

## 2016-03-04 DIAGNOSIS — I120 Hypertensive chronic kidney disease with stage 5 chronic kidney disease or end stage renal disease: Secondary | ICD-10-CM | POA: Insufficient documentation

## 2016-03-04 DIAGNOSIS — M1990 Unspecified osteoarthritis, unspecified site: Secondary | ICD-10-CM | POA: Diagnosis not present

## 2016-03-04 DIAGNOSIS — Z992 Dependence on renal dialysis: Secondary | ICD-10-CM

## 2016-03-04 HISTORY — PX: BASCILIC VEIN TRANSPOSITION: SHX5742

## 2016-03-04 LAB — GLUCOSE, CAPILLARY
GLUCOSE-CAPILLARY: 99 mg/dL (ref 65–99)
Glucose-Capillary: 101 mg/dL — ABNORMAL HIGH (ref 65–99)

## 2016-03-04 LAB — POCT I-STAT 4, (NA,K, GLUC, HGB,HCT)
Glucose, Bld: 101 mg/dL — ABNORMAL HIGH (ref 65–99)
HCT: 36 % (ref 36.0–46.0)
Hemoglobin: 12.2 g/dL (ref 12.0–15.0)
POTASSIUM: 4.3 mmol/L (ref 3.5–5.1)
Sodium: 140 mmol/L (ref 135–145)

## 2016-03-04 SURGERY — TRANSPOSITION, VEIN, BASILIC
Anesthesia: General | Laterality: Left

## 2016-03-04 MED ORDER — MIDAZOLAM HCL 2 MG/2ML IJ SOLN
INTRAMUSCULAR | Status: DC | PRN
  Administered 2016-03-04: 2 mg via INTRAVENOUS

## 2016-03-04 MED ORDER — HEPARIN SODIUM (PORCINE) 5000 UNIT/ML IJ SOLN
INTRAMUSCULAR | Status: AC
Start: 2016-03-04 — End: 2016-03-04
  Filled 2016-03-04: qty 1

## 2016-03-04 MED ORDER — BUPIVACAINE-EPINEPHRINE (PF) 0.5% -1:200000 IJ SOLN
INTRAMUSCULAR | Status: AC
  Filled 2016-03-04: qty 30

## 2016-03-04 MED ORDER — ONDANSETRON HCL 4 MG/2ML IJ SOLN
INTRAMUSCULAR | Status: DC | PRN
  Administered 2016-03-04: 4 mg via INTRAVENOUS

## 2016-03-04 MED ORDER — EVICEL 2 ML EX KIT
PACK | CUTANEOUS | Status: AC
  Filled 2016-03-04: qty 1

## 2016-03-04 MED ORDER — LIDOCAINE HCL (PF) 1 % IJ SOLN
INTRAMUSCULAR | Status: DC | PRN
  Administered 2016-03-04: 5 mL

## 2016-03-04 MED ORDER — CEFAZOLIN SODIUM-DEXTROSE 2-4 GM/100ML-% IV SOLN
2.0000 g | INTRAVENOUS | Status: AC
  Administered 2016-03-04 (×2): 2 g via INTRAVENOUS

## 2016-03-04 MED ORDER — EPHEDRINE SULFATE 50 MG/ML IJ SOLN
INTRAMUSCULAR | Status: DC | PRN
  Administered 2016-03-04 (×2): 5 mg via INTRAVENOUS

## 2016-03-04 MED ORDER — FAMOTIDINE 20 MG PO TABS
20.0000 mg | ORAL_TABLET | Freq: Once | ORAL | Status: AC
  Administered 2016-03-04: 20 mg via ORAL

## 2016-03-04 MED ORDER — PROPOFOL 10 MG/ML IV BOLUS
INTRAVENOUS | Status: DC | PRN
  Administered 2016-03-04: 50 mg via INTRAVENOUS
  Administered 2016-03-04: 120 mg via INTRAVENOUS
  Administered 2016-03-04: 30 mg via INTRAVENOUS

## 2016-03-04 MED ORDER — FAMOTIDINE 20 MG PO TABS
ORAL_TABLET | ORAL | Status: AC
  Administered 2016-03-04: 20 mg via ORAL
  Filled 2016-03-04: qty 1

## 2016-03-04 MED ORDER — OXYCODONE HCL 5 MG PO TABS
5.0000 mg | ORAL_TABLET | Freq: Once | ORAL | Status: AC | PRN
  Administered 2016-03-04: 5 mg via ORAL

## 2016-03-04 MED ORDER — LIDOCAINE HCL (CARDIAC) 20 MG/ML IV SOLN
INTRAVENOUS | Status: DC | PRN
  Administered 2016-03-04: 60 mg via INTRAVENOUS

## 2016-03-04 MED ORDER — FENTANYL CITRATE (PF) 100 MCG/2ML IJ SOLN: 25.0000 ug | INTRAMUSCULAR | Status: DC | PRN

## 2016-03-04 MED ORDER — HEPARIN SODIUM (PORCINE) 1000 UNIT/ML IJ SOLN
INTRAMUSCULAR | Status: DC | PRN
  Administered 2016-03-04: 12:00:00 via INTRAMUSCULAR

## 2016-03-04 MED ORDER — LIDOCAINE HCL (PF) 1 % IJ SOLN
INTRAMUSCULAR | Status: AC
  Filled 2016-03-04: qty 30

## 2016-03-04 MED ORDER — HEPARIN SODIUM (PORCINE) 1000 UNIT/ML IJ SOLN
INTRAMUSCULAR | Status: DC | PRN
  Administered 2016-03-04: 3500 [IU] via INTRAVENOUS

## 2016-03-04 MED ORDER — PAPAVERINE HCL 30 MG/ML IJ SOLN
INTRAMUSCULAR | Status: AC
  Filled 2016-03-04: qty 2

## 2016-03-04 MED ORDER — PAPAVERINE HCL 30 MG/ML IJ SOLN
INTRAMUSCULAR | Status: DC | PRN
  Administered 2016-03-04: 60 mg via INTRAVENOUS

## 2016-03-04 MED ORDER — CEFAZOLIN SODIUM-DEXTROSE 2-4 GM/100ML-% IV SOLN
INTRAVENOUS | Status: AC
  Administered 2016-03-04: 2 g via INTRAVENOUS
  Filled 2016-03-04: qty 100

## 2016-03-04 MED ORDER — PHENYLEPHRINE HCL 10 MG/ML IJ SOLN
INTRAMUSCULAR | Status: DC | PRN
  Administered 2016-03-04: 50 ug via INTRAVENOUS
  Administered 2016-03-04: 100 ug via INTRAVENOUS
  Administered 2016-03-04 (×2): 50 ug via INTRAVENOUS

## 2016-03-04 MED ORDER — FENTANYL CITRATE (PF) 100 MCG/2ML IJ SOLN
INTRAMUSCULAR | Status: DC | PRN
  Administered 2016-03-04 (×4): 25 ug via INTRAVENOUS

## 2016-03-04 MED ORDER — HYDROCODONE-ACETAMINOPHEN 5-325 MG PO TABS: 1.0000 | ORAL_TABLET | Freq: Four times a day (QID) | ORAL | Status: DC | PRN

## 2016-03-04 MED ORDER — BUPIVACAINE HCL (PF) 0.5 % IJ SOLN
INTRAMUSCULAR | Status: DC | PRN
  Administered 2016-03-04: 5 mL

## 2016-03-04 MED ORDER — SODIUM CHLORIDE 0.9 % IV SOLN
INTRAVENOUS | Status: DC
  Administered 2016-03-04 (×2): via INTRAVENOUS

## 2016-03-04 MED ORDER — OXYCODONE HCL 5 MG PO TABS
ORAL_TABLET | ORAL | Status: AC
  Filled 2016-03-04: qty 1

## 2016-03-04 MED ORDER — EVICEL 2 ML EX KIT
PACK | CUTANEOUS | Status: DC | PRN
  Administered 2016-03-04: 2 mL

## 2016-03-04 MED ORDER — BUPIVACAINE HCL (PF) 0.5 % IJ SOLN
INTRAMUSCULAR | Status: AC
  Filled 2016-03-04: qty 30

## 2016-03-04 MED ORDER — OXYCODONE HCL 5 MG/5ML PO SOLN: 5.0000 mg | Freq: Once | ORAL | Status: AC | PRN

## 2016-03-04 SURGICAL SUPPLY — 49 items
BAG DECANTER FOR FLEXI CONT (MISCELLANEOUS) ×2 IMPLANT
BLADE SURG SZ11 CARB STEEL (BLADE) ×2 IMPLANT
BOOT SUTURE AID YELLOW STND (SUTURE) ×2 IMPLANT
BRUSH SCRUB 4% CHG (MISCELLANEOUS) ×2 IMPLANT
CANISTER SUCT 1200ML W/VALVE (MISCELLANEOUS) ×2 IMPLANT
CHLORAPREP W/TINT 26ML (MISCELLANEOUS) ×2 IMPLANT
CLIP SPRNG 6MM S-JAW DBL (CLIP) ×2
ELECT CAUTERY BLADE 6.4 (BLADE) ×2 IMPLANT
ELECT REM PT RETURN 9FT ADLT (ELECTROSURGICAL) ×2
ELECTRODE REM PT RTRN 9FT ADLT (ELECTROSURGICAL) ×1 IMPLANT
GEL ULTRASOUND 20GR AQUASONIC (MISCELLANEOUS) IMPLANT
GLOVE BIO SURGEON STRL SZ7 (GLOVE) ×8 IMPLANT
GLOVE INDICATOR 7.5 STRL GRN (GLOVE) ×2 IMPLANT
GOWN STRL REUS W/ TWL LRG LVL3 (GOWN DISPOSABLE) ×2 IMPLANT
GOWN STRL REUS W/ TWL XL LVL3 (GOWN DISPOSABLE) ×1 IMPLANT
GOWN STRL REUS W/TWL LRG LVL3 (GOWN DISPOSABLE) ×2
GOWN STRL REUS W/TWL XL LVL3 (GOWN DISPOSABLE) ×1
HEMOSTAT SURGICEL 2X3 (HEMOSTASIS) IMPLANT
IV NS 500ML (IV SOLUTION) ×1
IV NS 500ML BAXH (IV SOLUTION) ×1 IMPLANT
KIT RM TURNOVER STRD PROC AR (KITS) ×2 IMPLANT
LABEL OR SOLS (LABEL) ×2 IMPLANT
LIQUID BAND (GAUZE/BANDAGES/DRESSINGS) ×2 IMPLANT
LOOP RED MAXI  1X406MM (MISCELLANEOUS) ×1
LOOP VESSEL MAXI 1X406 RED (MISCELLANEOUS) ×1 IMPLANT
LOOP VESSEL MINI 0.8X406 BLUE (MISCELLANEOUS) ×1 IMPLANT
LOOPS BLUE MINI 0.8X406MM (MISCELLANEOUS) ×1
NEEDLE FILTER BLUNT 18X 1/2SAF (NEEDLE) ×1
NEEDLE FILTER BLUNT 18X1 1/2 (NEEDLE) ×1 IMPLANT
NEEDLE HYPO 30X.5 LL (NEEDLE) IMPLANT
NS IRRIG 500ML POUR BTL (IV SOLUTION) ×2 IMPLANT
PACK EXTREMITY ARMC (MISCELLANEOUS) ×2 IMPLANT
PAD PREP 24X41 OB/GYN DISP (PERSONAL CARE ITEMS) ×2 IMPLANT
SOLUTION CELL SAVER (CLIP) ×1 IMPLANT
STOCKINETTE STRL 4IN 9604848 (GAUZE/BANDAGES/DRESSINGS) ×2 IMPLANT
SUT MNCRL AB 4-0 PS2 18 (SUTURE) ×2 IMPLANT
SUT PROLENE 6 0 BV (SUTURE) ×8 IMPLANT
SUT SILK 2 0 (SUTURE) ×1
SUT SILK 2-0 18XBRD TIE 12 (SUTURE) ×1 IMPLANT
SUT SILK 3 0 (SUTURE) ×1
SUT SILK 3-0 18XBRD TIE 12 (SUTURE) ×1 IMPLANT
SUT SILK 4 0 (SUTURE) ×1
SUT SILK 4-0 18XBRD TIE 12 (SUTURE) ×1 IMPLANT
SUT VIC AB 3-0 SH 27 (SUTURE) ×2
SUT VIC AB 3-0 SH 27X BRD (SUTURE) ×2 IMPLANT
SYR 20CC LL (SYRINGE) ×2 IMPLANT
SYR 3ML LL SCALE MARK (SYRINGE) ×2 IMPLANT
SYR TB 1ML 27GX1/2 LL (SYRINGE) IMPLANT
TOWEL OR 17X26 4PK STRL BLUE (TOWEL DISPOSABLE) IMPLANT

## 2016-03-04 NOTE — Anesthesia Postprocedure Evaluation (Signed)
Anesthesia Post Note  Patient: Ernest J Peery  Procedure(s) Performed: Procedure(s) (LRB): BASCILIC VEIN TRANSPOSITION ( STAGE 1 ) (Left)  Patient location during evaluation: PACU Anesthesia Type: General Level of consciousness: awake and alert Pain management: pain level controlled Vital Signs Assessment: post-procedure vital signs reviewed and stable Respiratory status: spontaneous breathing, nonlabored ventilation, respiratory function stable and patient connected to nasal cannula oxygen Cardiovascular status: blood pressure returned to baseline and stable Postop Assessment: no signs of nausea or vomiting Anesthetic complications: no    Last Vitals:  Filed Vitals:   03/04/16 1327 03/04/16 1358  BP: 155/58 143/61  Pulse: 83 81  Temp: 36.6 C   Resp: 18 18    Last Pain:  Filed Vitals:   03/04/16 1429  PainSc: 9                  Joseph K Piscitello

## 2016-03-04 NOTE — H&P (Signed)
  [facility] History & Physical Update  The patient was interviewed and re-examined.  The patient's previous History and Physical has been reviewed and is unchanged.  There is no change in the plan of care. We plan to proceed with the scheduled procedure. Patient understands that this is likely a two stage procedure and today's surgery will likely be the first stage and a second surgery will likely be required in 6-8 weeks.    DEW,JASON, MD  03/04/2016, 9:43 AM

## 2016-03-04 NOTE — Anesthesia Preprocedure Evaluation (Addendum)
Anesthesia Evaluation  Patient identified by MRN, date of birth, ID band Patient awake    Reviewed: Allergy & Precautions, H&P , NPO status , Patient's Chart, lab work & pertinent test results  History of Anesthesia Complications Negative for: history of anesthetic complications  Airway Mallampati: III  TM Distance: >3 FB Neck ROM: limited    Dental  (+) Poor Dentition, Chipped, Missing   Pulmonary neg pulmonary ROS, neg shortness of breath,    Pulmonary exam normal breath sounds clear to auscultation       Cardiovascular Exercise Tolerance: Good hypertension, (-) angina(-) Past MI and (-) DOE Normal cardiovascular exam Rhythm:regular Rate:Normal     Neuro/Psych negative neurological ROS  negative psych ROS   GI/Hepatic Neg liver ROS, GERD  Controlled,  Endo/Other  diabetes, Type 2, Insulin DependentHypothyroidism   Renal/GU Dialysis and ESRFRenal disease  negative genitourinary   Musculoskeletal  (+) Arthritis ,   Abdominal   Peds  Hematology negative hematology ROS (+)   Anesthesia Other Findings Past Medical History:   Anemia of chronic renal failure                              Primary hypertension                                         Hyperparathyroidism, secondary renal (H[facility])                   GERD (gastroesophageal reflux disease)                       Insulin dependent diabetes mellitus ([facility])                    Diabetic retinopathy ([facility])                                   Diabetic neuropathy (H[facility])                                    Osteoarthritis                                                 Comment:bilateral shoulders   Hypothyroidism                                              Past Surgical History:   ABDOMINAL HYSTERECTOMY                                        CHOLECYSTECTOMY                                               THYROIDECTOMY  PERIPHERAL VASCULAR CATHETERIZATION             N/A 01/22/2016       Comment:Procedure: Dialysis/Perma Catheter Insertion;                Surgeon: Katha Cabal, MD;  Location: [facility];  Service: Cardiovascular;                Laterality: N/A;     Reproductive/Obstetrics negative OB ROS                            Anesthesia Physical Anesthesia Plan  ASA: III  Anesthesia Plan: General LMA   Post-op Pain Management:    Induction:   Airway Management Planned:   Additional Equipment:   Intra-op Plan:   Post-operative Plan:   Informed Consent: I have reviewed the patients History and Physical, chart, labs and discussed the procedure including the risks, benefits and alternatives for the proposed anesthesia with the patient or authorized representative who has indicated his/her understanding and acceptance.   Dental Advisory Given  Plan Discussed with: Anesthesiologist, CRNA and Surgeon  Anesthesia Plan Comments:         Anesthesia Quick Evaluation

## 2016-03-04 NOTE — Op Note (Signed)
[facility]   OPERATIVE NOTE   PROCEDURE: Left brachiocephalic arteriovenous fistula placement  PRE-OPERATIVE DIAGNOSIS: 1.  ESRD      2. DM  POST-OPERATIVE DIAGNOSIS: 1. ESRD     2. DM  SURGEON: Leotis Pain, MD  ASSISTANT(S): none  ANESTHESIA: general  ESTIMATED BLOOD LOSS: minimal  FINDING(S): Adequate cephalic vein for fistula creation  SPECIMEN(S):  none  INDICATIONS:   Kendra [name] is a 65 y.o. female who presents with renal failure in need of pemanent dialysis acces.  The patient is scheduled for left arm AVF placement.  The patient is aware the risks include but are not limited to: bleeding, infection, steal syndrome, nerve damage, ischemic monomelic neuropathy, failure to mature, and need for additional procedures.  The patient is aware of the risks of the procedure and elects to proceed forward.  DESCRIPTION: After full informed written consent was obtained from the patient, the patient was brought back to the operating room and placed supine upon the operating table.  Prior to induction, the patient received IV antibiotics.   After obtaining adequate anesthesia, the patient was then prepped and draped in the standard fashion for a left arm access procedure.  I made a curvilinear incision at the level of the antecubital fossa and dissected through the subcutaneous tissue and fascia to gain exposure of the brachial artery.  This was noted to be patent and adequate in size for fistula creation.  This was dissected out proximally and distally and prepared for control with vessel loops. Our vein mapping had suggested that the basilic vein would be the better option for fistula creation.  However, at exploration of the antecubital fossa the basilic vein was actually smaller than the cephalic vein.  Given this finding, and the preference for using the cephalic vein as the primary fistula option, a brachiocephalic AVF was then planned. I then dissected out  the cephalic vein.  This was noted to be patent and adequate in size for fistula creation.  I then gave the patient 3500 units of intravenous heparin.  The vein was marked for orientation and the distal segment of the vein was ligated with a  2-0 silk, and the vein was transected.  I then instilled the heparinized saline into the vein and clamped it.  At this point, I reset my exposure of the brachial artery and pulled up control on the vessel loops.  I made an arteriotomy with a #11 blade, and then I extended the arteriotomy with a Potts scissor.  I injected heparinized saline proximal and distal to this arteriotomy.  The vein was then sewn to the artery in an end-to-side configuration with a running stitch of 6-0 Prolene.  Prior to completing this anastomosis, I allowed the vein and artery to backbleed.  There was no evidence of clot from any vessels.  I completed the anastomosis in the usual fashion and then released all vessel loops and clamps.  There was a palpable  thrill in the venous outflow, and there was a palpable pulse in the artery distal to the anastomosis.  At this point, I irrigated out the surgical wound.  Surgicel and Evicel was placed. There was no further active bleeding.  The subcutaneous tissue was reapproximated with a running stitch of 3-0 Vicryl.  The skin was then closed with a 4-0 Monocryl suture.  The skin was then cleaned, dried, and reinforced with Dermabond.  The patient tolerated this procedure well and was taken to the recovery  room in stable condition  COMPLICATIONS: None  CONDITION: Stable   Lauri Till    03/04/2016, 12:29 PM

## 2016-03-04 NOTE — Transfer of Care (Signed)
Immediate Anesthesia Transfer of Care Note  Patient: Kendra [name]  Procedure(s) Performed: Procedure(s): BASCILIC VEIN TRANSPOSITION ( STAGE 1 ) (Left)  Patient Location: PACU  Anesthesia Type:General  Level of Consciousness: awake, alert  and oriented  Airway & Oxygen Therapy: Patient Spontanous Breathing and Patient connected to nasal cannula oxygen  Post-op Assessment: Report given to RN and Post -op Vital signs reviewed and stable  Post vital signs: Reviewed and stable  Last Vitals:  Filed Vitals:   03/04/16 1002  BP: 143/77  Pulse: 94  Temp: 36.8 C  Resp: 20    Last Pain: There were no vitals filed for this visit.       Complications: No apparent anesthesia complications

## 2016-03-04 NOTE — Discharge Instructions (Signed)

## 2016-03-11 ENCOUNTER — Inpatient Hospital Stay: Payer: Managed Care, Other (non HMO)

## 2016-03-11 DIAGNOSIS — D631 Anemia in chronic kidney disease: Secondary | ICD-10-CM

## 2016-03-11 DIAGNOSIS — N189 Chronic kidney disease, unspecified: Principal | ICD-10-CM

## 2016-03-11 DIAGNOSIS — D509 Iron deficiency anemia, unspecified: Secondary | ICD-10-CM

## 2016-03-11 DIAGNOSIS — I129 Hypertensive chronic kidney disease with stage 1 through stage 4 chronic kidney disease, or unspecified chronic kidney disease: Secondary | ICD-10-CM | POA: Diagnosis not present

## 2016-03-11 LAB — HEMATOCRIT: HEMATOCRIT: 32.4 % — AB (ref 35.0–47.0)

## 2016-03-11 LAB — HEMOGLOBIN: HEMOGLOBIN: 10.6 g/dL — AB (ref 12.0–16.0)

## 2016-03-11 MED ORDER — EPOETIN ALFA 40000 UNIT/ML IJ SOLN
40000.0000 [IU] | Freq: Once | INTRAMUSCULAR | Status: AC
  Administered 2016-03-11: 40000 [IU] via SUBCUTANEOUS
  Filled 2016-03-11: qty 1

## 2016-03-25 ENCOUNTER — Inpatient Hospital Stay: Payer: Managed Care, Other (non HMO) | Admitting: Internal Medicine

## 2016-03-25 ENCOUNTER — Ambulatory Visit: Payer: Managed Care, Other (non HMO)

## 2016-03-25 ENCOUNTER — Inpatient Hospital Stay: Payer: Managed Care, Other (non HMO)

## 2016-03-26 ENCOUNTER — Inpatient Hospital Stay: Payer: Managed Care, Other (non HMO) | Attending: Internal Medicine

## 2016-03-26 ENCOUNTER — Inpatient Hospital Stay (HOSPITAL_BASED_OUTPATIENT_CLINIC_OR_DEPARTMENT_OTHER): Payer: Managed Care, Other (non HMO) | Admitting: Internal Medicine

## 2016-03-26 ENCOUNTER — Inpatient Hospital Stay: Payer: Managed Care, Other (non HMO)

## 2016-03-26 VITALS — BP 116/70 | HR 64 | Temp 96.4°F | Resp 18 | Wt 209.7 lb

## 2016-03-26 DIAGNOSIS — Z992 Dependence on renal dialysis: Secondary | ICD-10-CM | POA: Diagnosis not present

## 2016-03-26 DIAGNOSIS — Z794 Long term (current) use of insulin: Secondary | ICD-10-CM | POA: Diagnosis not present

## 2016-03-26 DIAGNOSIS — D631 Anemia in chronic kidney disease: Secondary | ICD-10-CM

## 2016-03-26 DIAGNOSIS — Z79899 Other long term (current) drug therapy: Secondary | ICD-10-CM

## 2016-03-26 DIAGNOSIS — I129 Hypertensive chronic kidney disease with stage 1 through stage 4 chronic kidney disease, or unspecified chronic kidney disease: Secondary | ICD-10-CM | POA: Diagnosis not present

## 2016-03-26 DIAGNOSIS — N185 Chronic kidney disease, stage 5: Secondary | ICD-10-CM | POA: Diagnosis not present

## 2016-03-26 DIAGNOSIS — D509 Iron deficiency anemia, unspecified: Secondary | ICD-10-CM | POA: Diagnosis not present

## 2016-03-26 DIAGNOSIS — N189 Chronic kidney disease, unspecified: Secondary | ICD-10-CM

## 2016-03-26 LAB — HEMOGLOBIN: HEMOGLOBIN: 11.2 g/dL — AB (ref 12.0–16.0)

## 2016-03-26 LAB — IRON AND TIBC
Iron: 67 ug/dL (ref 28–170)
SATURATION RATIOS: 29 % (ref 10.4–31.8)
TIBC: 228 ug/dL — AB (ref 250–450)
UIBC: 161 ug/dL

## 2016-03-26 LAB — FERRITIN: Ferritin: 276 ng/mL (ref 11–307)

## 2016-03-26 LAB — HEMATOCRIT: HCT: 33.6 % — ABNORMAL LOW (ref 35.0–47.0)

## 2016-03-26 NOTE — Progress Notes (Signed)
[place] OFFICE PROGRESS NOTE  Patient Care Team: Lottie Mussel III, MD as PCP - General (Internal Medicine) [name], MD as Consulting Physician (Internal Medicine) [name], MD as Consulting Physician (Hematology and Oncology)  HPI  SUMMARY of HEMATOLOGIC HISTORY:   # CHRONIC ANEMIA; SEP 2016-Hb-9.4 Urine PEP- NEG; K/L= 3.45 [N=1.65]; NOV 2014-M protein 0.2gm [IgG+IgA]; Jan 2015 Colonoscopy/EGD with Dr.Oh Gita Kudo in 5 years]; SEP 2016- START PROCRIT 20K units qW x4;PR;  NOV 28th START PROCRIT 40 K q 2W  # CKD [SEP 2016-creat ~2; Proteinuria 3.3 gm/dL; Dr.Kolluru]  INTERVAL HISTORY:  Very pleasant 65 year old  female patient with a long-standing history of anemia and also chronic renal insufficiency is here for follow-up; start on Procrit in September 2016 is here for follow-up.  In the interim patient has started on dialysis. She is tolerating dialysis well. She has a fistula in place; however she is currently being dialyzed through permacath.  Her blood pressures are better controlled. Denies any significant swelling in the legs. Denies any worsening shortness of breath or chest pain. Denies any blood in stools black stools.   REVIEW OF SYSTEMS:  A complete 10 point review of system is done which is negative except mentioned above in history of present illness  I have reviewed the past medical history, past surgical history, social history and family history with the patient and they are unchanged from previous note unless stated above.  ALLERGIES:  has No Known Allergies.  MEDICATIONS:  Current Outpatient Prescriptions  Medication Sig Dispense Refill  . acetaminophen (TYLENOL) 500 MG tablet Take 500 mg by mouth 2 (two) times a week.    [name] amLODipine (NORVASC) 10 MG tablet Take 10 mg by mouth every evening.     . Calcium Acetate 668 (169 Ca) MG TABS Take 1 capsule by mouth 3 (three) times daily with meals.    . enalapril (VASOTEC) 10 MG tablet  Take 10 mg by mouth every evening.     . furosemide (LASIX) 40 MG tablet Take 80 mg by mouth daily as needed.     . hydrALAZINE (APRESOLINE) 25 MG tablet Take 25 mg by mouth 2 (two) times daily.    [name] HYDROcodone-acetaminophen (NORCO) 5-325 MG tablet Take 1 tablet by mouth every 6 (six) hours as needed for moderate pain. 30 tablet 0  . insulin aspart (NOVOLOG FLEXPEN) 100 UNIT/ML FlexPen Inject 12 Units into the skin 3 (three) times daily. 12-15 units    . insulin glargine (LANTUS) 100 UNIT/ML injection Inject 25 Units into the skin at bedtime. 25- 30 units at bedtime depending on glucose serum levels    . Insulin Pen Needle (PEN NEEDLES 31GX5/16") 31G X 8 MM MISC Use for insulin injection TID    . levothyroxine (SYNTHROID, LEVOTHROID) 137 MCG tablet Take 1 tablet by mouth daily. Take on empty stomach 30 to 60 minutes before breakfast    . meclizine (ANTIVERT) 25 MG tablet Take 12.5 mg by mouth 3 (three) times daily. Reported on 01/22/2016    . metoprolol succinate (TOPROL-XL) 25 MG 24 hr tablet Take 25 mg by mouth 2 (two) times daily.    . promethazine (PHENERGAN) 25 MG tablet Take 25 mg by mouth every 6 (six) hours as needed for nausea or vomiting.    . sennosides-docusate sodium (SENOKOT-S) 8.6-50 MG tablet Take 1 tablet by mouth daily as needed for constipation. 1-2 tabs    . simvastatin (ZOCOR) 40 MG tablet Take 40 mg by mouth  daily at 6 PM.     . traMADol (ULTRAM) 50 MG tablet Take by mouth every 6 (six) hours as needed for moderate pain.     No current facility-administered medications for this visit.    PHYSICAL EXAMINATION:   BP 116/70 mmHg  Pulse 64  Temp(Src) 96.4 F (35.8 C) (Tympanic)  Resp 18  Wt 209 lb 10.5 oz (95.1 kg)  SpO2 99%  Filed Weights   03/26/16 0828  Weight: 209 lb 10.5 oz (95.1 kg)    GENERAL:alert, no distress and comfortable. She is alone.  EYES: positive for pallor OROPHARYNX: good dentition. No thrush or ulceration.  NECK: No thyromegaly LYMPH:  no  palpable lymphadenopathy in the cervical, axillary or inguinal LUNGS: clear to auscultation with normal breathing effort; No Wheeze or crackles  Cardio-vascular- Regular Rate & Rythm and no murmurs; positive for edema 1 + plus Bil.  ABDOMEN:abdomen soft, non-tender and normal bowel sounds; No hepato-splenomegaly.  Musculoskeletal:no cyanosis of digits and no clubbing  NEURO: alert & oriented x 3 with fluent speech, no focal motor/sensory deficits. SKIN: no skin rash; positive for pallor  LABORATORY DATA:  I have reviewed the data as listed    Component Value Date/Time   NA 140 03/04/2016 1004   NA 141 09/09/2014 1357   K 4.3 03/04/2016 1004   K 4.3 09/09/2014 1357   CL 108* 09/09/2014 1357   CO2 25 09/09/2014 1357   GLUCOSE 101* 03/04/2016 1004   GLUCOSE 173* 09/09/2014 1357   BUN 41* 09/09/2014 1357   CREATININE 1.47* 09/09/2014 1357   CALCIUM 8.4* 09/09/2014 1357   GFRNONAA 38* 09/09/2014 1357   GFRAA 46* 09/09/2014 1357    No results found for: SPEP, UPEP  Lab Results  Component Value Date   WBC 6.9 07/15/2015   NEUTROABS 3.9 07/15/2015   HGB 11.2* 03/26/2016   HCT 33.6* 03/26/2016   MCV 90.9 07/15/2015   PLT 272 07/15/2015      Chemistry      Component Value Date/Time   NA 140 03/04/2016 1004   NA 141 09/09/2014 1357   K 4.3 03/04/2016 1004   K 4.3 09/09/2014 1357   CL 108* 09/09/2014 1357   CO2 25 09/09/2014 1357   BUN 41* 09/09/2014 1357   CREATININE 1.47* 09/09/2014 1357      Component Value Date/Time   CALCIUM 8.4* 09/09/2014 1357       ASSESSMENT & PLAN:   # Chronic anemia- likely from chronic kidney disease.  Patient is on Procrit 40,000 units every 2 weeks; Today hemoglobin is 11. 2 hematocrit 33; we will hold Procrit today.   # Iron deficiency CKD- patient received IV Feraheme 510 mg 1 in March 17th. Iron studies today pending. If iron studies-shows significant iron deficiency would recommend one more IV Feraheme tomorrow.  # Discussed with  Dr.Kolluru- that patient's IV iron/Procrit could be done at the time of dialysis. Discussed with the patient she agrees.   # We will not plan any further appointments here as she is going to follow-up with nephrology for her Procrit IV iron infusion. She agrees.      [name], MD 03/26/2016 8:49 AM

## 2016-03-26 NOTE — Progress Notes (Signed)
Pt here for evaluation of anemia with CKD. On dialysis x 2 months. Fresh graph with incision intact to L arm. Ambulatory. Complains of numbness and aching in hands and feet since dialysis started. Decreased appetite. No blood in stools. Has constipation. Takes stool softeners and suppositories as needed. Here for possible procrit today.

## 2016-03-26 NOTE — Progress Notes (Signed)
Results for NATEESHA, KORIN (MRN [id number]) as of 03/26/2016 20:48  Ref. Range 03/04/2016 09:53 03/04/2016 10:04 03/04/2016 12:48 03/11/2016 10:24 03/26/2016 08:15  Iron Latest Ref Range: 28-170 ug/dL     67  UIBC Latest Units: ug/dL     161  TIBC Latest Ref Range: 250-450 ug/dL     228 (L)  Saturation Ratios Latest Ref Range: 10.4-31.8 %     29  Ferritin Latest Ref Range: 11-307 ng/mL     276  Hemoglobin Latest Ref Range: 12.0-16.0 g/dL  12.2  10.6 (L) 11.2 (L)  HCT Latest Ref Range: 35.0-47.0 %  36.0  32.4 (L) 33.6 (L)   Patient's iron stores are adequate; she will not need any IV iron at this time.   Heather-please inform patient of above. She'll follow-up with nephrology/Dr.Kolluru.

## 2016-03-27 ENCOUNTER — Ambulatory Visit: Payer: Managed Care, Other (non HMO)

## 2016-03-27 ENCOUNTER — Inpatient Hospital Stay: Payer: Managed Care, Other (non HMO)

## 2016-04-01 ENCOUNTER — Telehealth: Payer: Self-pay | Admitting: *Deleted

## 2016-04-01 NOTE — Telephone Encounter (Signed)
Per md-Patient's iron stores are adequate; she will not need any IV iron at this time. Md would like RN to inform patient of above. She'll follow-up with nephrology/Dr.Kolluru.

## 2016-04-01 NOTE — Telephone Encounter (Signed)
Called patient to inform her that her iron stores are adequate at this time and will not need IV iron.  She should follow up with nephrology/Dr. Juleen China.  Verbalized understanding.

## 2016-05-06 ENCOUNTER — Emergency Department: Payer: Medicare Other

## 2016-05-06 ENCOUNTER — Other Ambulatory Visit: Payer: Self-pay | Admitting: Vascular Surgery

## 2016-05-06 ENCOUNTER — Emergency Department
Admission: EM | Admit: 2016-05-06 | Discharge: 2016-05-06 | Disposition: A | Discharge status: Home / Self Care | Payer: Medicare Other | Attending: Emergency Medicine | Admitting: Emergency Medicine

## 2016-05-06 DIAGNOSIS — E039 Hypothyroidism, unspecified: Secondary | ICD-10-CM | POA: Diagnosis not present

## 2016-05-06 DIAGNOSIS — E1122 Type 2 diabetes mellitus with diabetic chronic kidney disease: Secondary | ICD-10-CM | POA: Diagnosis not present

## 2016-05-06 DIAGNOSIS — N189 Chronic kidney disease, unspecified: Secondary | ICD-10-CM | POA: Insufficient documentation

## 2016-05-06 DIAGNOSIS — E11319 Type 2 diabetes mellitus with unspecified diabetic retinopathy without macular edema: Secondary | ICD-10-CM | POA: Diagnosis not present

## 2016-05-06 DIAGNOSIS — I129 Hypertensive chronic kidney disease with stage 1 through stage 4 chronic kidney disease, or unspecified chronic kidney disease: Secondary | ICD-10-CM | POA: Diagnosis not present

## 2016-05-06 DIAGNOSIS — Z992 Dependence on renal dialysis: Secondary | ICD-10-CM | POA: Diagnosis not present

## 2016-05-06 DIAGNOSIS — R519 Headache, unspecified: Secondary | ICD-10-CM

## 2016-05-06 DIAGNOSIS — Z794 Long term (current) use of insulin: Secondary | ICD-10-CM | POA: Insufficient documentation

## 2016-05-06 DIAGNOSIS — Z79899 Other long term (current) drug therapy: Secondary | ICD-10-CM | POA: Insufficient documentation

## 2016-05-06 DIAGNOSIS — M1990 Unspecified osteoarthritis, unspecified site: Secondary | ICD-10-CM | POA: Insufficient documentation

## 2016-05-06 DIAGNOSIS — R51 Headache: Secondary | ICD-10-CM | POA: Diagnosis not present

## 2016-05-06 LAB — CBC WITH DIFFERENTIAL/PLATELET
BASOS ABS: 0.1 10*3/uL (ref 0–0.1)
Basophils Relative: 1 %
EOS PCT: 2 %
Eosinophils Absolute: 0.1 10*3/uL (ref 0–0.7)
HCT: 27.6 % — ABNORMAL LOW (ref 35.0–47.0)
Hemoglobin: 9.4 g/dL — ABNORMAL LOW (ref 12.0–16.0)
LYMPHS PCT: 26 %
Lymphs Abs: 1.7 10*3/uL (ref 1.0–3.6)
MCH: 31.3 pg (ref 26.0–34.0)
MCHC: 34.1 g/dL (ref 32.0–36.0)
MCV: 91.8 fL (ref 80.0–100.0)
MONO ABS: 0.7 10*3/uL (ref 0.2–0.9)
MONOS PCT: 11 %
Neutro Abs: 3.9 10*3/uL (ref 1.4–6.5)
Neutrophils Relative %: 60 %
PLATELETS: 277 10*3/uL (ref 150–440)
RBC: 3.01 MIL/uL — ABNORMAL LOW (ref 3.80–5.20)
RDW: 17.9 % — AB (ref 11.5–14.5)
WBC: 6.6 10*3/uL (ref 3.6–11.0)

## 2016-05-06 LAB — COMPREHENSIVE METABOLIC PANEL
ALT: 13 U/L — AB (ref 14–54)
AST: 21 U/L (ref 15–41)
Albumin: 3.7 g/dL (ref 3.5–5.0)
Alkaline Phosphatase: 39 U/L (ref 38–126)
Anion gap: 10 (ref 5–15)
BILIRUBIN TOTAL: 0.4 mg/dL (ref 0.3–1.2)
BUN: 32 mg/dL — AB (ref 6–20)
CHLORIDE: 99 mmol/L — AB (ref 101–111)
CO2: 26 mmol/L (ref 22–32)
CREATININE: 6.42 mg/dL — AB (ref 0.44–1.00)
Calcium: 8.3 mg/dL — ABNORMAL LOW (ref 8.9–10.3)
GFR calc Af Amer: 7 mL/min — ABNORMAL LOW (ref >60)
GFR, EST NON AFRICAN AMERICAN: 6 mL/min — AB (ref >60)
GLUCOSE: 88 mg/dL (ref 65–99)
Potassium: 4.7 mmol/L (ref 3.5–5.1)
Sodium: 135 mmol/L (ref 135–145)
Total Protein: 7.1 g/dL (ref 6.5–8.1)

## 2016-05-06 LAB — GLUCOSE, CAPILLARY: GLUCOSE-CAPILLARY: 93 mg/dL (ref 65–99)

## 2016-05-06 LAB — TROPONIN I

## 2016-05-06 MED ORDER — ONDANSETRON 4 MG PO TBDP
4.0000 mg | ORAL_TABLET | Freq: Once | ORAL | Status: AC
  Administered 2016-05-06: 4 mg via ORAL
  Filled 2016-05-06: qty 1

## 2016-05-06 MED ORDER — SODIUM CHLORIDE 0.9 % IV SOLN
8.0000 mg | Freq: Once | INTRAVENOUS | Status: DC
  Filled 2016-05-06: qty 4

## 2016-05-06 MED ORDER — ACETAMINOPHEN 325 MG PO TABS
650.0000 mg | ORAL_TABLET | Freq: Once | ORAL | Status: AC
  Administered 2016-05-06: 650 mg via ORAL
  Filled 2016-05-06: qty 2

## 2016-05-06 MED ORDER — ONDANSETRON HCL 4 MG/2ML IJ SOLN
INTRAMUSCULAR | Status: AC
  Administered 2016-05-06: 8 mg
  Filled 2016-05-06: qty 4

## 2016-05-06 NOTE — ED Notes (Signed)
Patient given gingerale and crackers 20 minutes ago. Patient called out to let this RN know she threw up crackers and ginger ale.

## 2016-05-06 NOTE — ED Notes (Signed)
Patient transported to CT 

## 2016-05-06 NOTE — ED Provider Notes (Addendum)
[facility] Emergency Department Provider Note   ____________________________________________  Time seen: Approximately 8:23 AM  I have reviewed the triage vital signs and the nursing notes.   HISTORY  Chief Complaint Headache   HPI Kendra [name] is a 65 y.o. female patient reports she always has a headache after dialysis. He usually comes on gradually goes away after an hour being at home after some Tylenol. Today she had her headache come on during dialysis but was much worse than usual. Patient reports she also became nauseated and has vomited at home. She cannot keep down her medicine. Headache still has not resolved and is still much worse than usual. Headache is constant diffuse and achy. Patient also reports she's had intermittent sharp stabbing chest pain in her left breast for the last several months and does not seem associated with deep breathing or exercise or any particular activity. He comes on intermittently and lasts for maybe half an hour and goes away. She is not short of breath with it. It has nothing to do with this headache of this nausea. It has not changed over months. He  Past Medical History  Diagnosis Date  . Anemia of chronic renal failure   . Primary hypertension   . Hyperparathyroidism, secondary renal ([place])   . GERD (gastroesophageal reflux disease)   . Insulin dependent diabetes mellitus ([place])   . Diabetic retinopathy ([place])   . Diabetic neuropathy ([place])   . Osteoarthritis     bilateral shoulders  . Hypothyroidism     Patient Active Problem List   Diagnosis Date Noted  . Impaired renal function 07/15/2015  . BP (high blood pressure) 07/15/2015  . Diabetes mellitus, type 2 ([place]) 07/15/2015  . Absolute anemia 07/15/2015    Past Surgical History  Procedure Laterality Date  . Abdominal hysterectomy    . Cholecystectomy    . Thyroidectomy    . Peripheral vascular catheterization N/A 01/22/2016    Procedure:  Dialysis/Perma Catheter Insertion;  Surgeon: Katha Cabal, MD;  Location: [facility];  Service: Cardiovascular;  Laterality: N/A;  . Bascilic vein transposition Left 03/04/2016    Procedure: BASCILIC VEIN TRANSPOSITION ( STAGE 1 );  Surgeon: Algernon Huxley, MD;  Location: [facility] ORS;  Service: Vascular;  Laterality: Left;    Current Outpatient Rx  Name  Route  Sig  Dispense  Refill  . acetaminophen (TYLENOL) 500 MG tablet   Oral   Take 500 mg by mouth 2 (two) times a week.         [name] amLODipine (NORVASC) 10 MG tablet   Oral   Take 10 mg by mouth every evening.          . Calcium Acetate 668 (169 Ca) MG TABS   Oral   Take 1 capsule by mouth 3 (three) times daily with meals.         . enalapril (VASOTEC) 10 MG tablet   Oral   Take 10 mg by mouth every evening.          . furosemide (LASIX) 40 MG tablet   Oral   Take 80 mg by mouth daily as needed.          . hydrALAZINE (APRESOLINE) 25 MG tablet   Oral   Take 25 mg by mouth 2 (two) times daily.         [name] HYDROcodone-acetaminophen (NORCO) 5-325 MG tablet   Oral   Take 1 tablet by mouth every 6 (  six) hours as needed for moderate pain.   30 tablet   0   . insulin aspart (NOVOLOG FLEXPEN) 100 UNIT/ML FlexPen   Subcutaneous   Inject 12 Units into the skin 3 (three) times daily. 12-15 units         . insulin glargine (LANTUS) 100 UNIT/ML injection   Subcutaneous   Inject 25 Units into the skin at bedtime. 25- 30 units at bedtime depending on glucose serum levels         . Insulin Pen Needle (PEN NEEDLES 31GX5/16") 31G X 8 MM MISC      Use for insulin injection TID         . levothyroxine (SYNTHROID, LEVOTHROID) 137 MCG tablet   Oral   Take 1 tablet by mouth daily. Take on empty stomach 30 to 60 minutes before breakfast         . meclizine (ANTIVERT) 25 MG tablet   Oral   Take 12.5 mg by mouth 3 (three) times daily. Reported on 01/22/2016         . metoprolol succinate (TOPROL-XL) 25 MG 24  hr tablet   Oral   Take 25 mg by mouth 2 (two) times daily.         . promethazine (PHENERGAN) 25 MG tablet   Oral   Take 25 mg by mouth every 6 (six) hours as needed for nausea or vomiting.         . sennosides-docusate sodium (SENOKOT-S) 8.6-50 MG tablet   Oral   Take 1 tablet by mouth daily as needed for constipation. 1-2 tabs         . simvastatin (ZOCOR) 40 MG tablet   Oral   Take 40 mg by mouth daily at 6 PM.          . traMADol (ULTRAM) 50 MG tablet   Oral   Take by mouth every 6 (six) hours as needed for moderate pain.           Allergies Review of patient's allergies indicates no known allergies.  Family History  Problem Relation Age of Onset  . Renal Disease Brother     with renal transplant  . Diabetes Mellitus II Father   . Diabetes Mellitus II Mother   . Diabetes Mellitus II Brother     Social History Social History  Substance Use Topics  . Smoking status: Never Smoker   . Smokeless tobacco: Never Used  . Alcohol Use: No    Review of Systems Constitutional: No fever/chills Eyes: No visual changes. ENT: No sore throat. CarSee history of present illnessespiratory: Gastrointestinal: No abdominal pain.nausea, vomiting.  No diarrhea.  No constipation. Genitourinary: Negative for dysuria. Musculoskeletal: Negative for back pain. Skin: Negative for rash. Neurological: Negative for  focal weakness or numbness.  10-point ROS otherwise negative.  ____________________________________________   PHYSICAL EXAM:  VITAL SIGNS: ED Triage Vitals  Enc Vitals Group     BP 05/06/16 0649 167/72 mmHg     Pulse Rate 05/06/16 0649 76     Resp 05/06/16 0649 18     Temp 05/06/16 0649 98.1 F (36.7 C)     Temp Source 05/06/16 0649 Oral     SpO2 05/06/16 0649 99 %     Weight 05/06/16 0649 210 lb (95.255 kg)     Height 05/06/16 0649 5\' 6"  (1.676 m)     Head Cir --      Peak Flow --      Pain Score 05/06/16 0649  10     Pain Loc --      Pain Edu? --       Excl. in [place]? --     Constitutional: Alert and oriented. Well appearing and in no acute distress. Eyes: Conjunctivae are normal. PERRL. EOMI.Fundi are difficult to see but appear to be normal Head: Atraumatic. Ears TMs are obscured by wax Nose: No congestion/rhinnorhea. Mouth/Throat: Mucous membranes are moist.  Oropharynx non-erythematous. Neck: No stridor. Cardiovascular: Normal rate, regular rhythm. Grossly normal heart sounds.  Good peripheral circulation. Respiratory: Normal respiratory effort.  No retractions. Lungs CTAB. Gastrointestinal: Soft and nontender. No distention. No abdominal bruits. No CVA tenderness. Musculoskeletal: No lower extremity tenderness nor edema.  No joint effusions. Neurologic:  Normal speech and language. No gross focal neurologic deficits are appreciated. specifically no changes in the cranial nerves cerebellar finger to nose or motor or sensory exam     No gait instability. Skin:  Skin is warm, dry and intact. No rash noted. Psychiatric: Mood and affect are normal. Speech and behavior are normal.  ____________________________________________   LABS (all labs ordered are listed, but only abnormal results are displayed)  Labs Reviewed  COMPREHENSIVE METABOLIC PANEL - Abnormal; Notable for the following:    Chloride 99 (*)    BUN 32 (*)    Creatinine, Ser 6.42 (*)    Calcium 8.3 (*)    ALT 13 (*)    GFR calc non Af Amer 6 (*)    GFR calc Af Amer 7 (*)    All other components within normal limits  CBC WITH DIFFERENTIAL/PLATELET - Abnormal; Notable for the following:    RBC 3.01 (*)    Hemoglobin 9.4 (*)    HCT 27.6 (*)    RDW 17.9 (*)    All other components within normal limits  GLUCOSE, CAPILLARY  TROPONIN I   ____________________________________________  EKG  EKG read and interpreted by me shows normal sinus rhythm rate of 71 left axis no acute ST-T wave changes computer is reading LVH and there is also decreased R-wave  progression. ____________________________________________  RADIOLOGY  CT read by radiology as no acute disease, Chest x-ray also read by radiology as no acute disease. ____________________________________________   PROCEDURES    Procedures   ____________________________________________   INITIAL IMPRESSION / ASSESSMENT AND PLAN / ED COURSE  Pertinent labs & imaging results that were available during my care of the patient were reviewed by me and considered in my medical decision making (see chart for details).  After eating patient feels better headache is eased a lot. We'll discharge her home. I will allow her to follow-up with her regular doctor. Since the chest pain really has not seemed to change and some time and is intermittent I will have her follow up with that for her regular doctor's well. ____________________________________________   FINAL CLINICAL IMPRESSION(S) / ED DIAGNOSES  Final diagnoses:  Nonintractable episodic headache, unspecified headache type      NEW MEDICATIONS STARTED DURING THIS VISIT:  Discharge Medication List as of 05/06/2016 12:36 PM       Note:  This document was prepared using Dragon voice recognition software and may include unintentional dictation errors.    Nena Polio, MD 05/06/16 1237  Nena Polio, MD 05/09/16 [phone]

## 2016-05-06 NOTE — ED Notes (Addendum)
Patient ambulatory to triage with steady gait, without difficulty or distress noted; pt reports frontal HA with N/V since yesterday; st normally gets HA after dialysis but having N/V as well

## 2016-05-06 NOTE — Discharge Instructions (Signed)
General Headache Without Cause A headache is pain or discomfort felt around the head or neck area. There are many causes and types of headaches. In some cases, the cause may not be found.  HOME CARE  Managing Pain  Take over-the-counter and prescription medicines only as told by your doctor.  Lie down in a dark, quiet room when you have a headache.  If directed, apply ice to the head and neck area:  Put ice in a plastic bag.  Place a towel between your skin and the bag.  Leave the ice on for 20 minutes, 2-3 times per day.  Use a heating pad or hot shower to apply heat to the head and neck area as told by your doctor.  Keep lights dim if bright lights bother you or make your headaches worse. Eating and Drinking  Eat meals on a regular schedule.  Lessen how much alcohol you drink.  Lessen how much caffeine you drink, or stop drinking caffeine. General Instructions  Keep all follow-up visits as told by your doctor. This is important.  Keep a journal to find out if certain things bring on headaches. For example, write down:  What you eat and drink.  How much sleep you get.  Any change to your diet or medicines.  Relax by getting a massage or doing other relaxing activities.  Lessen stress.  Sit up straight. Do not tighten (tense) your muscles.  Do not use tobacco products. This includes cigarettes, chewing tobacco, or e-cigarettes. If you need help quitting, ask your doctor.  Exercise regularly as told by your doctor.  Get enough sleep. This often means 7-9 hours of sleep. GET HELP IF:  Your symptoms are not helped by medicine.  You have a headache that feels different than the other headaches.  You feel sick to your stomach (nauseous) or you throw up (vomit).  You have a fever. GET HELP RIGHT AWAY IF:   Your headache becomes really bad.  You keep throwing up.  You have a stiff neck.  You have trouble seeing.  You have trouble speaking.  You have  pain in the eye or ear.  Your muscles are weak or you lose muscle control.  You lose your balance or have trouble walking.  You feel like you will pass out (faint) or you pass out.  You have confusion.   This information is not intended to replace advice given to you by your health care provider. Make sure you discuss any questions you have with your health care provider.   Document Released: 07/14/2008 Document Revised: 06/26/2015 Document Reviewed: 01/28/2015 Elsevier Interactive Patient Education [employer]. Please return for worse headache vomiting or feeling sicker. Otherwise please follow-up with your regular doctor.

## 2016-05-06 NOTE — ED Notes (Signed)
Patient given food by MD and able to hold food down. Pt states she still has a slight headache but would like to go home and lay down. MD made aware

## 2016-05-14 ENCOUNTER — Encounter
Admission: RE | Disposition: A | Discharge status: Home / Self Care | Payer: Self-pay | Source: Ambulatory Visit | Attending: Vascular Surgery

## 2016-05-14 ENCOUNTER — Ambulatory Visit
Admission: RE | Admit: 2016-05-14 | Discharge: 2016-05-14 | Disposition: A | Discharge status: Home / Self Care | Payer: Medicare Other | Source: Ambulatory Visit | Attending: Vascular Surgery | Admitting: Vascular Surgery

## 2016-05-14 ENCOUNTER — Encounter: Payer: Self-pay | Admitting: *Deleted

## 2016-05-14 DIAGNOSIS — Z9049 Acquired absence of other specified parts of digestive tract: Secondary | ICD-10-CM | POA: Insufficient documentation

## 2016-05-14 DIAGNOSIS — D631 Anemia in chronic kidney disease: Secondary | ICD-10-CM | POA: Diagnosis not present

## 2016-05-14 DIAGNOSIS — T82858A Stenosis of vascular prosthetic devices, implants and grafts, initial encounter: Secondary | ICD-10-CM | POA: Diagnosis not present

## 2016-05-14 DIAGNOSIS — E1122 Type 2 diabetes mellitus with diabetic chronic kidney disease: Secondary | ICD-10-CM | POA: Insufficient documentation

## 2016-05-14 DIAGNOSIS — K219 Gastro-esophageal reflux disease without esophagitis: Secondary | ICD-10-CM | POA: Insufficient documentation

## 2016-05-14 DIAGNOSIS — E1140 Type 2 diabetes mellitus with diabetic neuropathy, unspecified: Secondary | ICD-10-CM | POA: Diagnosis not present

## 2016-05-14 DIAGNOSIS — N2581 Secondary hyperparathyroidism of renal origin: Secondary | ICD-10-CM | POA: Insufficient documentation

## 2016-05-14 DIAGNOSIS — Z992 Dependence on renal dialysis: Secondary | ICD-10-CM | POA: Insufficient documentation

## 2016-05-14 DIAGNOSIS — Y832 Surgical operation with anastomosis, bypass or graft as the cause of abnormal reaction of the patient, or of later complication, without mention of misadventure at the time of the procedure: Secondary | ICD-10-CM | POA: Diagnosis not present

## 2016-05-14 DIAGNOSIS — Z833 Family history of diabetes mellitus: Secondary | ICD-10-CM | POA: Diagnosis not present

## 2016-05-14 DIAGNOSIS — M19011 Primary osteoarthritis, right shoulder: Secondary | ICD-10-CM | POA: Diagnosis not present

## 2016-05-14 DIAGNOSIS — M19012 Primary osteoarthritis, left shoulder: Secondary | ICD-10-CM | POA: Diagnosis not present

## 2016-05-14 DIAGNOSIS — I120 Hypertensive chronic kidney disease with stage 5 chronic kidney disease or end stage renal disease: Secondary | ICD-10-CM | POA: Insufficient documentation

## 2016-05-14 DIAGNOSIS — Z841 Family history of disorders of kidney and ureter: Secondary | ICD-10-CM | POA: Diagnosis not present

## 2016-05-14 DIAGNOSIS — Z90710 Acquired absence of both cervix and uterus: Secondary | ICD-10-CM | POA: Insufficient documentation

## 2016-05-14 DIAGNOSIS — Z794 Long term (current) use of insulin: Secondary | ICD-10-CM | POA: Diagnosis not present

## 2016-05-14 DIAGNOSIS — E039 Hypothyroidism, unspecified: Secondary | ICD-10-CM | POA: Insufficient documentation

## 2016-05-14 HISTORY — DX: Essential (primary) hypertension: I10

## 2016-05-14 HISTORY — PX: PERIPHERAL VASCULAR CATHETERIZATION: SHX172C

## 2016-05-14 LAB — POTASSIUM (ARMC VASCULAR LAB ONLY): Potassium (ARMC vascular lab): 3.9 (ref 3.5–5.1)

## 2016-05-14 SURGERY — A/V SHUNTOGRAM/FISTULAGRAM
Anesthesia: Moderate Sedation | Site: Arm Upper

## 2016-05-14 MED ORDER — ONDANSETRON HCL 4 MG/2ML IJ SOLN: 4.0000 mg | Freq: Four times a day (QID) | INTRAMUSCULAR | Status: DC | PRN

## 2016-05-14 MED ORDER — ALUM & MAG HYDROXIDE-SIMETH 200-200-20 MG/5ML PO SUSP: 15.0000 mL | ORAL | Status: DC | PRN

## 2016-05-14 MED ORDER — OXYCODONE-ACETAMINOPHEN 5-325 MG PO TABS: 1.0000 | ORAL_TABLET | ORAL | Status: DC | PRN

## 2016-05-14 MED ORDER — LIDOCAINE HCL (PF) 1 % IJ SOLN
INTRAMUSCULAR | Status: AC
  Filled 2016-05-14: qty 30

## 2016-05-14 MED ORDER — MIDAZOLAM HCL 5 MG/5ML IJ SOLN
INTRAMUSCULAR | Status: AC
  Filled 2016-05-14: qty 5

## 2016-05-14 MED ORDER — HEPARIN SODIUM (PORCINE) 1000 UNIT/ML IJ SOLN
INTRAMUSCULAR | Status: AC
  Filled 2016-05-14: qty 1

## 2016-05-14 MED ORDER — ACETAMINOPHEN 325 MG PO TABS: 325.0000 mg | ORAL_TABLET | ORAL | Status: DC | PRN

## 2016-05-14 MED ORDER — HEPARIN SODIUM (PORCINE) 1000 UNIT/ML IJ SOLN
INTRAMUSCULAR | Status: DC | PRN
  Administered 2016-05-14: 3000 [IU] via INTRAVENOUS

## 2016-05-14 MED ORDER — FENTANYL CITRATE (PF) 100 MCG/2ML IJ SOLN
INTRAMUSCULAR | Status: DC | PRN
Start: 2016-05-14 — End: 2016-05-14
  Administered 2016-05-14 (×2): 50 ug via INTRAVENOUS

## 2016-05-14 MED ORDER — LABETALOL HCL 5 MG/ML IV SOLN: 10.0000 mg | INTRAVENOUS | Status: DC | PRN

## 2016-05-14 MED ORDER — ACETAMINOPHEN 325 MG RE SUPP: 325.0000 mg | RECTAL | Status: DC | PRN

## 2016-05-14 MED ORDER — METOPROLOL TARTRATE 5 MG/5ML IV SOLN: 2.0000 mg | INTRAVENOUS | Status: DC | PRN

## 2016-05-14 MED ORDER — PHENOL 1.4 % MT LIQD: 1.0000 | OROMUCOSAL | Status: DC | PRN

## 2016-05-14 MED ORDER — MORPHINE SULFATE (PF) 4 MG/ML IV SOLN: 2.0000 mg | INTRAVENOUS | Status: DC | PRN

## 2016-05-14 MED ORDER — FENTANYL CITRATE (PF) 100 MCG/2ML IJ SOLN
INTRAMUSCULAR | Status: AC
  Filled 2016-05-14: qty 2

## 2016-05-14 MED ORDER — MIDAZOLAM HCL 2 MG/2ML IJ SOLN
INTRAMUSCULAR | Status: DC | PRN
Start: 2016-05-14 — End: 2016-05-14
  Administered 2016-05-14 (×2): 2 mg via INTRAVENOUS

## 2016-05-14 MED ORDER — GUAIFENESIN-DM 100-10 MG/5ML PO SYRP: 15.0000 mL | ORAL_SOLUTION | ORAL | Status: DC | PRN

## 2016-05-14 MED ORDER — HYDRALAZINE HCL 20 MG/ML IJ SOLN: 5.0000 mg | INTRAMUSCULAR | Status: DC | PRN

## 2016-05-14 MED ORDER — IOPAMIDOL (ISOVUE-300) INJECTION 61%
INTRAVENOUS | Status: DC | PRN
  Administered 2016-05-14: 20 mL via INTRAVENOUS

## 2016-05-14 MED ORDER — SODIUM CHLORIDE 0.9 % IV SOLN: 500.0000 mL | Freq: Once | INTRAVENOUS | Status: DC | PRN

## 2016-05-14 SURGICAL SUPPLY — 9 items
BALLN DORADO 8X100X80 (BALLOONS) ×4
BALLOON DORADO 8X100X80 (BALLOONS) ×2 IMPLANT
CANNULA 5F STIFF (CANNULA) ×8 IMPLANT
DEVICE PRESTO INFLATION (MISCELLANEOUS) ×4 IMPLANT
DRAPE BRACHIAL (DRAPES) ×4 IMPLANT
PACK ANGIOGRAPHY (CUSTOM PROCEDURE TRAY) ×4 IMPLANT
SHEATH BRITE TIP 6FRX5.5 (SHEATH) ×4 IMPLANT
TOWEL OR 17X26 4PK STRL BLUE (TOWEL DISPOSABLE) ×4 IMPLANT
WIRE MAGIC TOR.035 180C (WIRE) ×4 IMPLANT

## 2016-05-14 NOTE — Discharge Instructions (Signed)

## 2016-05-14 NOTE — Op Note (Signed)
[facility]    OPERATIVE NOTE   PROCEDURE: 1.   Left brachiocephalic arteriovenous fistula cannulation under ultrasound guidance 2.   Left arm fistulagram including central venogram 3.   Percutaneous transluminal angioplasty of left mid upper arm cephalic vein with 8 mm x 10 cm high pressure balloon  PRE-OPERATIVE DIAGNOSIS: 1. ESRD 2. Poorly functional left brachiocephalic AVF  POST-OPERATIVE DIAGNOSIS: same as above   SURGEON: Jason Dew, MD  ANESTHESIA: local with MCS  ESTIMATED BLOOD LOSS: minimal  FINDING(S): 1. 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  Moderate depth of the vein as well.  SPECIMEN(S):  None  CONTRAST: 20 cc  FLUORO TIME: less than 2 minutes  MODERATE CONSCIOUS SEDATION TIME: Approximately 20 minutes with 4 mg of Versed and 100 mcg of Fentanyl   INDICATIONS: Kendra [name] is a 65 y.o. female who presents with malfunctioning  Left brachiocephalic arteriovenous fistula.  The patient is scheduled for  Left arm fistulagram.  The patient is aware the risks include but are not limited to: bleeding, infection, thrombosis of the cannulated access, and possible anaphylactic reaction to the contrast.  The patient is aware of the risks of the procedure and elects to proceed forward.  DESCRIPTION: After full informed written consent was obtained, the patient was brought back to the angiography suite and placed supine upon the angiography table.  The patient was connected to monitoring equipment. Moderate conscious sedation was administered with a face to face encounter with the patient throughout the procedure with my supervision of the RN administering medicines and monitoring the patient's vital signs and mental status throughout from the start of the procedure until the patient was taken to the recovery room. The  Left arm was prepped and draped in the standard fashion for a percutaneous access intervention.   Under ultrasound guidance, the  Left brachiocephalic arteriovenous fistula was cannulated with a micropuncture needle under direct ultrasound guidance and a permanent image was performed.  The microwire was advanced into the fistula and the needle was exchanged for the a microsheath.  I then upsized to a 6 Fr Sheath and imaging was performed.  Hand injections were completed to image the access including the central venous system. 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  Moderate depth of the vein as well..  Based on the images, this patient will need intervention to this area and possibly a superficialization of the fistula in the future as well. I then gave the patient 3000 units of intravenous heparin.  I then crossed the stenosis with a Magic Tourqe wire.  Based on the imaging, a 8 mm x 10 cm  High pressure angioplasty balloon was selected.  The balloon was centered around the mid upper arm cephalic vein stenosis and inflated to 12 ATM for 1 minute(s).  On completion imaging, a <20% residual stenosis was present.     Based on the completion imaging, no further intervention is necessary.  The wire and balloon were removed from the sheath.  A 4-0 Monocryl purse-string suture was sewn around the sheath.  The sheath was removed while tying down the suture.  A sterile bandage was applied to the puncture site.  COMPLICATIONS: None  CONDITION: Stable   DEW,JASON  05/14/2016 12:16 PM    

## 2016-05-14 NOTE — H&P (Signed)
[facility] Admission History & Physical  MRN : [id number]  Kendra [name] is a 65 y.o. ([birth year]) female who presents with chief complaint of No chief complaint on file. [name]  History of Present Illness: Patient sent from dialysis center for difficult access and low flow.  She is otherwise well and had this fistula placed about two months ago and hopes to use this soon.  No current facility-administered medications for this encounter.     Past Medical History:  Diagnosis Date  . Anemia of chronic renal failure   . Diabetic neuropathy ([place])   . Diabetic retinopathy ([place])   . GERD (gastroesophageal reflux disease)   . Hyperparathyroidism, secondary renal ([place])   . Hypertension   . Hypothyroidism   . Insulin dependent diabetes mellitus ([place])   . Osteoarthritis    bilateral shoulders  . Primary hypertension     Past Surgical History:  Procedure Laterality Date  . ABDOMINAL HYSTERECTOMY    . BASCILIC VEIN TRANSPOSITION Left 03/04/2016   Procedure: BASCILIC VEIN TRANSPOSITION ( STAGE 1 );  Surgeon: Algernon Huxley, MD;  Location: [facility];  Service: Vascular;  Laterality: Left;  . CHOLECYSTECTOMY    . PERIPHERAL VASCULAR CATHETERIZATION N/A 01/22/2016   Procedure: Dialysis/Perma Catheter Insertion;  Surgeon: Katha Cabal, MD;  Location: [facility];  Service: Cardiovascular;  Laterality: N/A;  . THYROIDECTOMY      Social History Social History  Substance Use Topics  . Smoking status: Never Smoker  . Smokeless tobacco: Never Used  . Alcohol use No    Family History Family History  Problem Relation Age of Onset  . Renal Disease Brother     with renal transplant  . Diabetes Mellitus II Father   . Diabetes Mellitus II Mother   . Diabetes Mellitus II Brother     No Known Allergies   REVIEW OF SYSTEMS (Negative unless checked)  Constitutional: [] Weight loss  [] Fever  [] Chills Cardiac: [] Chest pain   [] Chest pressure    [] Palpitations   [] Shortness of breath when laying flat   [] Shortness of breath at rest   [] Shortness of breath with exertion. Vascular:  [] Pain in legs with walking   [] Pain in legs at rest   [] Pain in legs when laying flat   [] Claudication   [] Pain in feet when walking  [] Pain in feet at rest  [] Pain in feet when laying flat   [] History of DVT   [] Phlebitis   [x] Swelling in legs   [] Varicose veins   [] Non-healing ulcers Pulmonary:   [] Uses home oxygen   [] Productive cough   [] Hemoptysis   [] Wheeze  [] COPD   [] Asthma Neurologic:  [] Dizziness  [] Blackouts   [] Seizures   [] History of stroke   [] History of TIA  [] Aphasia   [] Temporary blindness   [] Dysphagia   [] Weakness or numbness in arms   [] Weakness or numbness in legs Musculoskeletal:  [] Arthritis   [] Joint swelling   [] Joint pain   [] Low back pain Hematologic:  [] Easy bruising  [] Easy bleeding   [] Hypercoagulable state   [] Anemic  [] Hepatitis Gastrointestinal:  [] Blood in stool   [] Vomiting blood  [] Gastroesophageal reflux/heartburn   [] Difficulty swallowing. Genitourinary:  [x] Chronic kidney disease   [] Difficult urination  [] Frequent urination  [] Burning with urination   [] Blood in urine Skin:  [] Rashes   [] Ulcers   [] Wounds Psychological:  [] History of anxiety   []  History of major depression.  Physical Examination  Vitals:   05/14/16 1020  BP: (!) 112/57  Pulse: 72  Temp: 98.3 F (36.8 C)  TempSrc: Oral  Weight: 95.3 kg (210 lb)  Height: 5\' 6"  (1.676 m)   Body mass index is 33.89 kg/m. Gen: WD/WN, NAD Head: [place]/AT, No temporalis wasting. Prominent temp pulse not noted. Ear/Nose/Throat: Hearing grossly intact, nares w/o erythema or drainage, oropharynx w/o Erythema/Exudate,  Eyes: PERRLA, EOMI.  Neck: Supple, no nuchal rigidity.  No JVD.  Pulmonary:  Good air movement,  no use of accessory muscles.  Cardiac: RRR, normal S1, S2 Vascular: thrill present in left arm AVF Vessel Right Left  Radial Palpable Palpable                                    Gastrointestinal: soft, non-tender/non-distended. No guarding/reflex.  Musculoskeletal: M/S 5/5 throughout.  Extremities without ischemic changes.  No deformity or atrophy.  Neurologic: CN 2-12 intact. Pain and light touch intact in extremities.  Symmetrical.  Speech is fluent. Motor exam as listed above. Psychiatric: Judgment intact, Mood & affect appropriate for pt's clinical situation. Dermatologic: No rashes or ulcers noted.  No cellulitis or open wounds. Lymph : No Cervical, Axillary, or Inguinal lymphadenopathy.     CBC Lab Results  Component Value Date   WBC 6.6 05/06/2016   HGB 9.4 (L) 05/06/2016   HCT 27.6 (L) 05/06/2016   MCV 91.8 05/06/2016   PLT 277 05/06/2016    BMET    Component Value Date/Time   NA 135 05/06/2016 0815   NA 141 09/09/2014 1357   K 4.7 05/06/2016 0815   K 4.3 09/09/2014 1357   CL 99 (L) 05/06/2016 0815   CL 108 (H) 09/09/2014 1357   CO2 26 05/06/2016 0815   CO2 25 09/09/2014 1357   GLUCOSE 88 05/06/2016 0815   GLUCOSE 173 (H) 09/09/2014 1357   BUN 32 (H) 05/06/2016 0815   BUN 41 (H) 09/09/2014 1357   CREATININE 6.42 (H) 05/06/2016 0815   CREATININE 1.47 (H) 09/09/2014 1357   CALCIUM 8.3 (L) 05/06/2016 0815   CALCIUM 8.4 (L) 09/09/2014 1357   GFRNONAA 6 (L) 05/06/2016 0815   GFRNONAA 38 (L) 09/09/2014 1357   GFRAA 7 (L) 05/06/2016 0815   GFRAA 46 (L) 09/09/2014 1357   Estimated Creatinine Clearance: 10 mL/min (by C-G formula based on SCr of 6.42 mg/dL).  COAG Lab Results  Component Value Date   INR 0.90 02/27/2016    Radiology Ct Head Wo Contrast  Result Date: 05/06/2016 CLINICAL DATA:  Frontal headache following dialysis yesterday. No trauma. EXAM: CT HEAD WITHOUT CONTRAST TECHNIQUE: Contiguous axial images were obtained from the base of the skull through the vertex without intravenous contrast. COMPARISON:  Head CT 09/09/2014 FINDINGS: Brain parenchyma: Normal for age. The basal ganglia and insular  ribbons are preserved. No hyperdense vessel is identified. No intraparenchymal hemorrhage or evidence of acute cortical infarct. No mass lesion or midline shift. Ventricles, sulci and extra-axial spaces: Normal for age. No extra-axial collection. Calcification along the left tentorial leaflet is unchanged. Paranasal sinuses and mastoids: Normal. Visualized orbits: Normal. Skull and extracranial soft tissues: Normal. IMPRESSION: No acute intracranial abnormality.  Normal brain. Electronically Signed   By: Ulyses Jarred M.D.   On: 05/06/2016 08:57   Dg Chest Portable 1 View  Result Date: 05/06/2016 CLINICAL DATA:  Shortness of breath with headache and nausea. Chronic renal failure. Left-sided chest pain and EXAM: PORTABLE CHEST 1 VIEW COMPARISON:  None.  FINDINGS: There is no edema or consolidation. Heart is enlarged with pulmonary vascularity within normal limits. Central catheter tip is in the superior vena cava near the cavoatrial junction. No pneumothorax. There is atherosclerotic calcification in the aortic arch. No blastic or lytic bone lesions are evident. IMPRESSION: No edema or consolidation. Mild cardiomegaly. Aortic atherosclerosis. Electronically Signed   By: Lowella Grip III M.D.   On: 05/06/2016 09:00      Assessment/Plan 1. Dysfunction of dialysis access.  Difficult to stick, low flow. Fistulagram for further evaluation. 2. ESRD. Needs better access. 3. HTN. Stable. On outpatient medications   Zair Borawski, MD  05/14/2016 12:11 PM

## 2016-05-14 NOTE — H&P (Signed)
  [facility] History & Physical Update  The patient was interviewed and re-examined.  The patient's previous History and Physical has been reviewed and is unchanged.  There is no change in the plan of care. We plan to proceed with the scheduled procedure.  DEW,JASON, MD  05/14/2016, 10:29 AM

## 2016-05-15 ENCOUNTER — Encounter: Payer: Self-pay | Admitting: Vascular Surgery

## 2016-06-23 ENCOUNTER — Other Ambulatory Visit: Payer: Self-pay | Admitting: Vascular Surgery

## 2016-06-25 ENCOUNTER — Encounter
Admission: RE | Disposition: A | Discharge status: Home / Self Care | Payer: Self-pay | Source: Ambulatory Visit | Attending: Vascular Surgery

## 2016-06-25 ENCOUNTER — Ambulatory Visit
Admission: RE | Admit: 2016-06-25 | Discharge: 2016-06-25 | Disposition: A | Discharge status: Home / Self Care | Payer: Medicare Other | Source: Ambulatory Visit | Attending: Vascular Surgery | Admitting: Vascular Surgery

## 2016-06-25 DIAGNOSIS — N186 End stage renal disease: Secondary | ICD-10-CM | POA: Insufficient documentation

## 2016-06-25 DIAGNOSIS — Z452 Encounter for adjustment and management of vascular access device: Secondary | ICD-10-CM | POA: Insufficient documentation

## 2016-06-25 DIAGNOSIS — Z833 Family history of diabetes mellitus: Secondary | ICD-10-CM | POA: Insufficient documentation

## 2016-06-25 DIAGNOSIS — Z841 Family history of disorders of kidney and ureter: Secondary | ICD-10-CM | POA: Diagnosis not present

## 2016-06-25 DIAGNOSIS — E7800 Pure hypercholesterolemia, unspecified: Secondary | ICD-10-CM | POA: Diagnosis not present

## 2016-06-25 DIAGNOSIS — Z794 Long term (current) use of insulin: Secondary | ICD-10-CM | POA: Diagnosis not present

## 2016-06-25 DIAGNOSIS — Z87891 Personal history of nicotine dependence: Secondary | ICD-10-CM | POA: Insufficient documentation

## 2016-06-25 DIAGNOSIS — Z992 Dependence on renal dialysis: Secondary | ICD-10-CM

## 2016-06-25 DIAGNOSIS — Z9049 Acquired absence of other specified parts of digestive tract: Secondary | ICD-10-CM | POA: Diagnosis not present

## 2016-06-25 DIAGNOSIS — Z90710 Acquired absence of both cervix and uterus: Secondary | ICD-10-CM | POA: Insufficient documentation

## 2016-06-25 HISTORY — PX: PERIPHERAL VASCULAR CATHETERIZATION: SHX172C

## 2016-06-25 SURGERY — DIALYSIS/PERMA CATHETER REMOVAL
Anesthesia: Moderate Sedation

## 2016-06-25 SURGICAL SUPPLY — 2 items
KIT SUTURE REMOVAL HAMOT (SET/KITS/TRAYS/PACK) ×3 IMPLANT
TRAY LACERAT/PLASTIC (MISCELLANEOUS) ×3 IMPLANT

## 2016-06-25 NOTE — H&P (Signed)
  [facility] History & Physical Update  The patient was interviewed and re-examined.  The patient's previous History and Physical has been reviewed and is unchanged.  There is no change in the plan of care. We plan to proceed with the scheduled procedure.  Tina Gruner, MD  06/25/2016, 8:05 AM

## 2016-06-25 NOTE — Op Note (Signed)
Operative Note     Preoperative diagnosis:   1. ESRD with functional permanent access  Postoperative diagnosis:  1. ESRD with functional permanent access  Procedure:  Removal of right jugular Permcath  Surgeon:  Leotis Pain, MD  Anesthesia:  Local  EBL:  Minimal  Indication for the Procedure:  The patient has a functional permanent dialysis access and no longer needs their permcath.  This can be removed.  Risks and benefits are discussed and informed consent is obtained.  Description of the Procedure:  The patient's right neck, chest and existing catheter were sterilely prepped and draped. The area around the catheter was anesthetized copiously with 1% lidocaine. The catheter was dissected out with curved hemostats until the cuff was freed from the surrounding fibrous sheath. The fiber sheath was transected, and the catheter was then removed in its entirety using gentle traction. Pressure was held and sterile dressings were placed. The patient tolerated the procedure well and was taken to the recovery room in stable condition.     DEW,JASON  06/25/2016, 9:10 AM

## 2016-06-26 MED ORDER — LIDOCAINE-EPINEPHRINE (PF) 1 %-1:200000 IJ SOLN
INTRAMUSCULAR | Status: DC | PRN
  Administered 2016-06-25: 10 mL via INTRADERMAL

## 2016-06-28 ENCOUNTER — Emergency Department
Admission: EM | Admit: 2016-06-28 | Discharge: 2016-06-28 | Disposition: A | Discharge status: Home / Self Care | Payer: Medicare Other | Attending: Emergency Medicine | Admitting: Emergency Medicine

## 2016-06-28 ENCOUNTER — Emergency Department: Payer: Medicare Other

## 2016-06-28 ENCOUNTER — Encounter: Payer: Self-pay | Admitting: *Deleted

## 2016-06-28 DIAGNOSIS — E039 Hypothyroidism, unspecified: Secondary | ICD-10-CM | POA: Insufficient documentation

## 2016-06-28 DIAGNOSIS — Z79899 Other long term (current) drug therapy: Secondary | ICD-10-CM | POA: Insufficient documentation

## 2016-06-28 DIAGNOSIS — N189 Chronic kidney disease, unspecified: Secondary | ICD-10-CM | POA: Insufficient documentation

## 2016-06-28 DIAGNOSIS — I129 Hypertensive chronic kidney disease with stage 1 through stage 4 chronic kidney disease, or unspecified chronic kidney disease: Secondary | ICD-10-CM | POA: Diagnosis not present

## 2016-06-28 DIAGNOSIS — E119 Type 2 diabetes mellitus without complications: Secondary | ICD-10-CM | POA: Insufficient documentation

## 2016-06-28 DIAGNOSIS — Z794 Long term (current) use of insulin: Secondary | ICD-10-CM | POA: Insufficient documentation

## 2016-06-28 DIAGNOSIS — M79674 Pain in right toe(s): Secondary | ICD-10-CM | POA: Diagnosis present

## 2016-06-28 NOTE — ED Triage Notes (Signed)
States right foot big toe pain for several weeks, pt has hx of DM and is on dialysis, swelling and some redness noted to right big toe, pt ambulatory

## 2016-06-28 NOTE — ED Notes (Signed)
MD Kinner at pt bedside at this time obtaining manual pedal pulses, pulses 125 bpm at this time.  MD Kinner obtaining manual radial pulses, radial pulses also approximately 125 bpm.

## 2016-06-28 NOTE — ED Notes (Addendum)
Pt comes to ED w/ c/o R. Toe pain x3wks. Pt is a diabetic and on dialysis. Upon assessment R. Big toe shows inflammation and some redness. Pt states painful when moving extremity or walking. Pt has tried epsom salt and taping the toe at home with no relief. Pt AOx4, NAD noted.

## 2016-06-28 NOTE — ED Provider Notes (Signed)
[facility] Emergency Department Provider Note   ____________________________________________    I have reviewed the triage vital signs and the nursing notes.   HISTORY  Chief Complaint Toe Pain     HPI Kendra [name] is a 65 y.o. female who presents with complaints of toe pain. Patient reports she has had pain at the base of her right toe for approximately 3 weeks, is worse with ambulation. She has a history of end-stage renal disease on dialysis as well as diabetes. She had a full course of dialysis today. She denies fevers or chills. No shortness of breath. She reports the pain is aching in nature.   Past Medical History:  Diagnosis Date  . Anemia of chronic renal failure   . Diabetic neuropathy ([place])   . Diabetic retinopathy ([place])   . GERD (gastroesophageal reflux disease)   . Hyperparathyroidism, secondary renal ([place])   . Hypertension   . Hypothyroidism   . Insulin dependent diabetes mellitus ([place])   . Osteoarthritis    bilateral shoulders  . Primary hypertension     Patient Active Problem List   Diagnosis Date Noted  . Impaired renal function 07/15/2015  . BP (high blood pressure) 07/15/2015  . Diabetes mellitus, type 2 ([place]) 07/15/2015  . Absolute anemia 07/15/2015    Past Surgical History:  Procedure Laterality Date  . ABDOMINAL HYSTERECTOMY    . BASCILIC VEIN TRANSPOSITION Left 03/04/2016   Procedure: BASCILIC VEIN TRANSPOSITION ( STAGE 1 );  Surgeon: Algernon Huxley, MD;  Location: [facility];  Service: Vascular;  Laterality: Left;  . CHOLECYSTECTOMY    . PERIPHERAL VASCULAR CATHETERIZATION N/A 01/22/2016   Procedure: Dialysis/Perma Catheter Insertion;  Surgeon: Katha Cabal, MD;  Location: [facility];  Service: Cardiovascular;  Laterality: N/A;  . PERIPHERAL VASCULAR CATHETERIZATION Left 05/14/2016   Procedure: A/V Shuntogram/Fistulagram;  Surgeon: Algernon Huxley, MD;  Location: [facility];  Service:  Cardiovascular;  Laterality: Left;  . PERIPHERAL VASCULAR CATHETERIZATION N/A 05/14/2016   Procedure: A/V Shunt Intervention;  Surgeon: Algernon Huxley, MD;  Location: [facility];  Service: Cardiovascular;  Laterality: N/A;  . THYROIDECTOMY      Prior to Admission medications   Medication Sig Start Date End Date Taking? Authorizing Provider  acetaminophen (TYLENOL) 500 MG tablet Take 500 mg by mouth every 6 (six) hours as needed for mild pain.     Historical Provider, MD  Calcium Acetate 668 (169 Ca) MG TABS Take 1 capsule by mouth 3 (three) times daily with meals.    Historical Provider, MD  enalapril (VASOTEC) 10 MG tablet Take 10 mg by mouth every evening.     Historical Provider, MD  furosemide (LASIX) 40 MG tablet Take 80 mg by mouth daily as needed for fluid.     Historical Provider, MD  hydrALAZINE (APRESOLINE) 25 MG tablet Take 25 mg by mouth 2 (two) times daily.    Historical Provider, MD  HYDROcodone-acetaminophen (NORCO) 5-325 MG tablet Take 1 tablet by mouth every 6 (six) hours as needed for moderate pain. 03/04/16   Algernon Huxley, MD  insulin aspart (NOVOLOG FLEXPEN) 100 UNIT/ML FlexPen Inject 8-12 Units into the skin 3 (three) times daily.  09/06/14   Historical Provider, MD  insulin glargine (LANTUS) 100 UNIT/ML injection Inject 30 Units into the skin at bedtime. 25- 30 units at bedtime depending on glucose serum levels 10/15/14   Historical Provider, MD  Insulin Pen Needle (PEN NEEDLES 31GX5/16")  31G X 8 MM MISC Use for insulin injection TID 07/31/14   Historical Provider, MD  levothyroxine (SYNTHROID, LEVOTHROID) 137 MCG tablet Take 1 tablet by mouth daily. Take on empty stomach 30 to 60 minutes before breakfast 04/03/15   Historical Provider, MD  meclizine (ANTIVERT) 25 MG tablet Take 12.5 mg by mouth 3 (three) times daily. Reported on 01/22/2016    Historical Provider, MD  metoprolol succinate (TOPROL-XL) 25 MG 24 hr tablet Take 25 mg by mouth daily.     Historical Provider, MD    promethazine (PHENERGAN) 25 MG tablet Take 25 mg by mouth every 6 (six) hours as needed for nausea or vomiting.    Historical Provider, MD  sennosides-docusate sodium (SENOKOT-S) 8.6-50 MG tablet Take 1 tablet by mouth daily as needed for constipation. 1-2 tabs    Historical Provider, MD  simvastatin (ZOCOR) 40 MG tablet Take 40 mg by mouth daily at 6 PM.     Historical Provider, MD  traMADol (ULTRAM) 50 MG tablet Take by mouth every 6 (six) hours as needed for moderate pain.    Historical Provider, MD     Allergies Review of patient's allergies indicates no known allergies.  Family History  Problem Relation Age of Onset  . Renal Disease Brother     with renal transplant  . Diabetes Mellitus II Father   . Diabetes Mellitus II Mother   . Diabetes Mellitus II Brother     Social History Social History  Substance Use Topics  . Smoking status: Never Smoker  . Smokeless tobacco: Never Used  . Alcohol use No    Review of Systems  Constitutional: No fever/chills Eyes: No visual changes.   Cardiovascular: Denies chest pain. Respiratory: Denies shortness of breath. Gastrointestinal: No abdominal pain.  No nausea, no vomiting.    Musculoskeletal: Toe pain Skin: Negative for rash. Neurological: Negative for headaches or weakness  10-point ROS otherwise negative.  ____________________________________________   PHYSICAL EXAM:  VITAL SIGNS: ED Triage Vitals  Enc Vitals Group     BP 06/28/16 1319 (!) 113/49     Pulse Rate 06/28/16 1319 68     Resp 06/28/16 1319 16     Temp 06/28/16 1319 97.8 F (36.6 C)     Temp Source 06/28/16 1319 Oral     SpO2 06/28/16 1319 100 %     Weight 06/28/16 1320 210 lb (95.3 kg)     Height 06/28/16 1320 5\' 5"  (1.651 m)     Head Circumference --      Peak Flow --      Pain Score 06/28/16 1323 10     Pain Loc --      Pain Edu? --      Excl. in [place]? --     Constitutional: Alert and oriented. No acute distress. Pleasant and  interactive Eyes: Conjunctivae are normal.  Head: Atraumatic. Nose: No congestion/rhinnorhea. Mouth/Throat: Mucous membranes are moist.    Cardiovascular: Normal rate, regular rhythm. Grossly normal heart sounds.  Respiratory: Normal respiratory effort.  No retractions. Lungs CTAB.  Genitourinary: deferred Musculoskeletal: Dopplerable DP pulses bilaterally. Mild erythema at the base of the right toe, no skin break or ulceration. No significant tenderness to palpation. No swelling of the toe. No duskiness, normal cap refill. Neurologic:  Normal speech and language. No gross focal neurologic deficits are appreciated.  Skin:  Skin is warm, dry and intact. No rash noted. Psychiatric: Mood and affect are normal. Speech and behavior are normal.  ____________________________________________  LABS (all labs ordered are listed, but only abnormal results are displayed)  Labs Reviewed - No data to display ____________________________________________  EKG  None ____________________________________________  RADIOLOGY  X-ray demonstrates arthritis at the location of the patient's pain ____________________________________________   PROCEDURES  Procedure(s) performed: No    Critical Care performed:No ____________________________________________   INITIAL IMPRESSION / ASSESSMENT AND PLAN / ED COURSE  Pertinent labs & imaging results that were available during my care of the patient were reviewed by me and considered in my medical decision making (see chart for details).  Patient with significant risk factor for  vascular disease. ABIs are normal.No evidence of acute ischemia. No evidence of infection. Certainly could be related to arthritic pain but given her history I did discuss the case with Dr. Lucky Cowboy of vascular surgery who recommends follow-up in his office in 2 days. I discussed this with the patient and her family and they agree with this plan, she agrees to return if any  worsening of her conditions  Clinical Course   ____________________________________________   FINAL CLINICAL IMPRESSION(S) / ED DIAGNOSES  Final diagnoses:  Great toe pain, right      NEW MEDICATIONS STARTED DURING THIS VISIT:  Discharge Medication List as of 06/28/2016  3:27 PM       Note:  This document was prepared using Dragon voice recognition software and may include unintentional dictation errors.    Lavonia Drafts, MD 06/28/16 [phone]

## 2016-06-29 ENCOUNTER — Encounter: Payer: Self-pay | Admitting: Vascular Surgery

## 2016-07-30 ENCOUNTER — Encounter (INDEPENDENT_AMBULATORY_CARE_PROVIDER_SITE_OTHER): Payer: Self-pay

## 2016-07-31 ENCOUNTER — Other Ambulatory Visit (HOSPITAL_COMMUNITY): Payer: Self-pay | Admitting: Internal Medicine

## 2016-07-31 DIAGNOSIS — N186 End stage renal disease: Secondary | ICD-10-CM

## 2016-08-01 ENCOUNTER — Inpatient Hospital Stay (HOSPITAL_COMMUNITY): Admission: RE | Admit: 2016-08-01 | Payer: Managed Care, Other (non HMO) | Source: Ambulatory Visit

## 2016-08-03 ENCOUNTER — Encounter
Admission: RE | Disposition: A | Discharge status: Home / Self Care | Payer: Self-pay | Source: Ambulatory Visit | Attending: Vascular Surgery

## 2016-08-03 ENCOUNTER — Ambulatory Visit
Admission: RE | Admit: 2016-08-03 | Discharge: 2016-08-03 | Disposition: A | Discharge status: Home / Self Care | Payer: Medicare Other | Source: Ambulatory Visit | Attending: Vascular Surgery | Admitting: Vascular Surgery

## 2016-08-03 ENCOUNTER — Other Ambulatory Visit (INDEPENDENT_AMBULATORY_CARE_PROVIDER_SITE_OTHER): Payer: Self-pay | Admitting: Vascular Surgery

## 2016-08-03 DIAGNOSIS — Z9049 Acquired absence of other specified parts of digestive tract: Secondary | ICD-10-CM | POA: Diagnosis not present

## 2016-08-03 DIAGNOSIS — Y832 Surgical operation with anastomosis, bypass or graft as the cause of abnormal reaction of the patient, or of later complication, without mention of misadventure at the time of the procedure: Secondary | ICD-10-CM | POA: Insufficient documentation

## 2016-08-03 DIAGNOSIS — K219 Gastro-esophageal reflux disease without esophagitis: Secondary | ICD-10-CM | POA: Insufficient documentation

## 2016-08-03 DIAGNOSIS — M1990 Unspecified osteoarthritis, unspecified site: Secondary | ICD-10-CM | POA: Insufficient documentation

## 2016-08-03 DIAGNOSIS — Z841 Family history of disorders of kidney and ureter: Secondary | ICD-10-CM | POA: Diagnosis not present

## 2016-08-03 DIAGNOSIS — E119 Type 2 diabetes mellitus without complications: Secondary | ICD-10-CM | POA: Diagnosis not present

## 2016-08-03 DIAGNOSIS — E1122 Type 2 diabetes mellitus with diabetic chronic kidney disease: Secondary | ICD-10-CM | POA: Insufficient documentation

## 2016-08-03 DIAGNOSIS — Z833 Family history of diabetes mellitus: Secondary | ICD-10-CM | POA: Diagnosis not present

## 2016-08-03 DIAGNOSIS — T82858A Stenosis of vascular prosthetic devices, implants and grafts, initial encounter: Secondary | ICD-10-CM | POA: Diagnosis not present

## 2016-08-03 DIAGNOSIS — E1140 Type 2 diabetes mellitus with diabetic neuropathy, unspecified: Secondary | ICD-10-CM | POA: Diagnosis not present

## 2016-08-03 DIAGNOSIS — I10 Essential (primary) hypertension: Secondary | ICD-10-CM

## 2016-08-03 DIAGNOSIS — D631 Anemia in chronic kidney disease: Secondary | ICD-10-CM | POA: Insufficient documentation

## 2016-08-03 DIAGNOSIS — Z794 Long term (current) use of insulin: Secondary | ICD-10-CM | POA: Insufficient documentation

## 2016-08-03 DIAGNOSIS — Z992 Dependence on renal dialysis: Secondary | ICD-10-CM | POA: Diagnosis not present

## 2016-08-03 DIAGNOSIS — Z90710 Acquired absence of both cervix and uterus: Secondary | ICD-10-CM | POA: Insufficient documentation

## 2016-08-03 DIAGNOSIS — N2581 Secondary hyperparathyroidism of renal origin: Secondary | ICD-10-CM | POA: Diagnosis not present

## 2016-08-03 DIAGNOSIS — E890 Postprocedural hypothyroidism: Secondary | ICD-10-CM | POA: Insufficient documentation

## 2016-08-03 DIAGNOSIS — E11319 Type 2 diabetes mellitus with unspecified diabetic retinopathy without macular edema: Secondary | ICD-10-CM | POA: Insufficient documentation

## 2016-08-03 DIAGNOSIS — N186 End stage renal disease: Secondary | ICD-10-CM | POA: Diagnosis not present

## 2016-08-03 DIAGNOSIS — I120 Hypertensive chronic kidney disease with stage 5 chronic kidney disease or end stage renal disease: Secondary | ICD-10-CM | POA: Insufficient documentation

## 2016-08-03 HISTORY — PX: PERIPHERAL VASCULAR CATHETERIZATION: SHX172C

## 2016-08-03 LAB — POTASSIUM (ARMC VASCULAR LAB ONLY): POTASSIUM (ARMC VASCULAR LAB): 6 — AB (ref 3.5–5.1)

## 2016-08-03 SURGERY — A/V SHUNTOGRAM/FISTULAGRAM
Anesthesia: Moderate Sedation | Site: Arm Upper

## 2016-08-03 MED ORDER — ACETAMINOPHEN 325 MG RE SUPP: 325.0000 mg | RECTAL | Status: DC | PRN

## 2016-08-03 MED ORDER — MIDAZOLAM HCL 2 MG/2ML IJ SOLN
INTRAMUSCULAR | Status: DC | PRN
  Administered 2016-08-03: 2 mg via INTRAVENOUS

## 2016-08-03 MED ORDER — PHENOL 1.4 % MT LIQD: 1.0000 | OROMUCOSAL | Status: DC | PRN

## 2016-08-03 MED ORDER — DEXTROSE 5 % IV SOLN
1.5000 g | INTRAVENOUS | Status: DC
  Filled 2016-08-03: qty 1.5

## 2016-08-03 MED ORDER — FENTANYL CITRATE (PF) 100 MCG/2ML IJ SOLN
INTRAMUSCULAR | Status: DC | PRN
  Administered 2016-08-03: 50 ug via INTRAVENOUS

## 2016-08-03 MED ORDER — FAMOTIDINE 20 MG PO TABS: 40.0000 mg | ORAL_TABLET | ORAL | Status: DC | PRN

## 2016-08-03 MED ORDER — METHYLPREDNISOLONE SODIUM SUCC 125 MG IJ SOLR: 125.0000 mg | INTRAMUSCULAR | Status: DC | PRN

## 2016-08-03 MED ORDER — METOPROLOL TARTRATE 5 MG/5ML IV SOLN: 2.0000 mg | INTRAVENOUS | Status: DC | PRN

## 2016-08-03 MED ORDER — GUAIFENESIN-DM 100-10 MG/5ML PO SYRP: 15.0000 mL | ORAL_SOLUTION | ORAL | Status: DC | PRN

## 2016-08-03 MED ORDER — OXYCODONE-ACETAMINOPHEN 5-325 MG PO TABS: 1.0000 | ORAL_TABLET | ORAL | Status: DC | PRN

## 2016-08-03 MED ORDER — FENTANYL CITRATE (PF) 100 MCG/2ML IJ SOLN
INTRAMUSCULAR | Status: AC
  Filled 2016-08-03: qty 2

## 2016-08-03 MED ORDER — ALUM & MAG HYDROXIDE-SIMETH 200-200-20 MG/5ML PO SUSP: 15.0000 mL | ORAL | Status: DC | PRN

## 2016-08-03 MED ORDER — LABETALOL HCL 5 MG/ML IV SOLN: 10.0000 mg | INTRAVENOUS | Status: DC | PRN

## 2016-08-03 MED ORDER — ACETAMINOPHEN 325 MG PO TABS: 325.0000 mg | ORAL_TABLET | ORAL | Status: DC | PRN

## 2016-08-03 MED ORDER — SODIUM CHLORIDE 0.9 % IV SOLN: INTRAVENOUS | Status: DC

## 2016-08-03 MED ORDER — LIDOCAINE-EPINEPHRINE (PF) 1 %-1:200000 IJ SOLN
INTRAMUSCULAR | Status: AC
  Filled 2016-08-03: qty 30

## 2016-08-03 MED ORDER — HEPARIN SODIUM (PORCINE) 1000 UNIT/ML IJ SOLN
INTRAMUSCULAR | Status: DC | PRN
  Administered 2016-08-03: 3000 [IU] via INTRAVENOUS

## 2016-08-03 MED ORDER — SODIUM CHLORIDE 0.9 % IV SOLN: 500.0000 mL | Freq: Once | INTRAVENOUS | Status: DC | PRN

## 2016-08-03 MED ORDER — MORPHINE SULFATE (PF) 4 MG/ML IV SOLN: 2.0000 mg | INTRAVENOUS | Status: DC | PRN

## 2016-08-03 MED ORDER — HEPARIN (PORCINE) IN NACL 2-0.9 UNIT/ML-% IJ SOLN
INTRAMUSCULAR | Status: AC
  Filled 2016-08-03: qty 1000

## 2016-08-03 MED ORDER — HEPARIN SODIUM (PORCINE) 1000 UNIT/ML IJ SOLN
INTRAMUSCULAR | Status: AC
  Filled 2016-08-03: qty 1

## 2016-08-03 MED ORDER — HYDRALAZINE HCL 20 MG/ML IJ SOLN: 5.0000 mg | INTRAMUSCULAR | Status: DC | PRN

## 2016-08-03 MED ORDER — ONDANSETRON HCL 4 MG/2ML IJ SOLN: 4.0000 mg | Freq: Four times a day (QID) | INTRAMUSCULAR | Status: DC | PRN

## 2016-08-03 MED ORDER — MIDAZOLAM HCL 2 MG/2ML IJ SOLN
INTRAMUSCULAR | Status: AC
  Filled 2016-08-03: qty 2

## 2016-08-03 MED ORDER — HYDROMORPHONE HCL 1 MG/ML IJ SOLN: 1.0000 mg | Freq: Once | INTRAMUSCULAR | Status: DC

## 2016-08-03 SURGICAL SUPPLY — 7 items
CANNULA 5F STIFF (CANNULA) ×4 IMPLANT
DRAPE BRACHIAL (DRAPES) ×4 IMPLANT
PACK ANGIOGRAPHY (CUSTOM PROCEDURE TRAY) ×4 IMPLANT
SHEATH BRITE TIP 6FRX5.5 (SHEATH) ×4 IMPLANT
SHIELD RADPAD DADD DRAPE 4X9 (MISCELLANEOUS) ×4 IMPLANT
SUT MNCRL AB 4-0 PS2 18 (SUTURE) ×4 IMPLANT
TOWEL OR 17X26 4PK STRL BLUE (TOWEL DISPOSABLE) ×4 IMPLANT

## 2016-08-03 NOTE — H&P (Signed)
[facility] Admission History & Physical  MRN : [id number]  Kendra [name] is a 65 y.o. ([birth year]) female who presents with chief complaint of No chief complaint on file. [name]  History of Present Illness: Patient is sent from her dialysis access center for poorly functioning AV fistula. She had a basilic transposition about 5 months ago. Her flow rates are down and her clearances are suboptimal, and the dialysis center has requested further evaluation with a fistulogram. The patient is in her usual state of health and has no specific complaints today.  Current Facility-Administered Medications  Medication Dose Route Frequency Provider Last Rate Last Dose  . 0.9 %  sodium chloride infusion   Intravenous Continuous Kimberly A Stegmayer, PA-C      . cefUROXime (ZINACEF) 1.5 g in dextrose 5 % 50 mL IVPB  1.5 g Intravenous 30 min Pre-Op Kimberly A Stegmayer, PA-C      . famotidine (PEPCID) tablet 40 mg  40 mg Oral PRN Janalyn Harder Stegmayer, PA-C      . HYDROmorphone (DILAUDID) injection 1 mg  1 mg Intravenous Once [employer], PA-C      . methylPREDNISolone sodium succinate (SOLU-MEDROL) 125 mg/2 mL injection 125 mg  125 mg Intravenous PRN Kimberly A Stegmayer, PA-C      . ondansetron (ZOFRAN) injection 4 mg  4 mg Intravenous Q6H PRN Sela Hua, PA-C        Past Medical History:  Diagnosis Date  . Anemia of chronic renal failure   . Diabetic neuropathy ([place])   . Diabetic retinopathy ([place])   . GERD (gastroesophageal reflux disease)   . Hyperparathyroidism, secondary renal ([place])   . Hypertension   . Hypothyroidism   . Insulin dependent diabetes mellitus ([place])   . Osteoarthritis    bilateral shoulders  . Primary hypertension     Past Surgical History:  Procedure Laterality Date  . ABDOMINAL HYSTERECTOMY    . BASCILIC VEIN TRANSPOSITION Left 03/04/2016   Procedure: BASCILIC VEIN TRANSPOSITION ( STAGE 1 );  Surgeon: Algernon Huxley, MD;  Location:  [facility];  Service: Vascular;  Laterality: Left;  . CHOLECYSTECTOMY    . PERIPHERAL VASCULAR CATHETERIZATION N/A 01/22/2016   Procedure: Dialysis/Perma Catheter Insertion;  Surgeon: Katha Cabal, MD;  Location: [facility];  Service: Cardiovascular;  Laterality: N/A;  . PERIPHERAL VASCULAR CATHETERIZATION Left 05/14/2016   Procedure: A/V Shuntogram/Fistulagram;  Surgeon: Algernon Huxley, MD;  Location: [place] [facility];  Service: Cardiovascular;  Laterality: Left;  . PERIPHERAL VASCULAR CATHETERIZATION N/A 05/14/2016   Procedure: A/V Shunt Intervention;  Surgeon: Algernon Huxley, MD;  Location: [facility] [facility];  Service: Cardiovascular;  Laterality: N/A;  . PERIPHERAL VASCULAR CATHETERIZATION N/A 06/25/2016   Procedure: Dialysis/Perma Catheter Removal;  Surgeon: Algernon Huxley, MD;  Location: [place] [facility];  Service: Cardiovascular;  Laterality: N/A;  . THYROIDECTOMY      Social History Social History  Substance Use Topics  . Smoking status: Never Smoker  . Smokeless tobacco: Never Used  . Alcohol use No  No IV drug use  Family History Family History  Problem Relation Age of Onset  . Renal Disease Brother     with renal transplant  . Diabetes Mellitus II Father   . Diabetes Mellitus II Mother   . Diabetes Mellitus II Brother     No Known Allergies   REVIEW OF SYSTEMS (Negative unless checked)  Constitutional: [] Weight loss  [] Fever  [] Chills  Cardiac: [] Chest pain   [] Chest pressure   [] Palpitations   [] Shortness of breath when laying flat   [] Shortness of breath at rest   [] Shortness of breath with exertion. Vascular:  [] Pain in legs with walking   [] Pain in legs at rest   [] Pain in legs when laying flat   [] Claudication   [] Pain in feet when walking  [] Pain in feet at rest  [] Pain in feet when laying flat   [] History of DVT   [] Phlebitis   [] Swelling in legs   [] Varicose veins   [] Non-healing ulcers Pulmonary:   [] Uses home oxygen   [] Productive cough    [] Hemoptysis   [] Wheeze  [] COPD   [] Asthma Neurologic:  [] Dizziness  [] Blackouts   [] Seizures   [] History of stroke   [] History of TIA  [] Aphasia   [] Temporary blindness   [] Dysphagia   [] Weakness or numbness in arms   [] Weakness or numbness in legs Musculoskeletal:  [] Arthritis   [] Joint swelling   [] Joint pain   [] Low back pain Hematologic:  [] Easy bruising  [] Easy bleeding   [] Hypercoagulable state   [x] Anemic  [] Hepatitis Gastrointestinal:  [] Blood in stool   [] Vomiting blood  [] Gastroesophageal reflux/heartburn   [] Difficulty swallowing. Genitourinary:  [x] Chronic kidney disease   [] Difficult urination  [] Frequent urination  [] Burning with urination   [] Blood in urine Skin:  [] Rashes   [] Ulcers   [] Wounds Psychological:  [] History of anxiety   []  History of major depression.  Physical Examination  Vitals:   08/03/16 1211  BP: (!) 152/71  Pulse: 66  Resp: 20  Temp: 97.8 F (36.6 C)  TempSrc: Oral  SpO2: 97%  Weight: 95.3 kg (210 lb)  Height: 5\' 5"  (1.651 m)   Body mass index is 34.95 kg/m. Gen: WD/WN, NAD Head: [place]/AT, No temporalis wasting. Prominent temp pulse not noted. Ear/Nose/Throat: Hearing grossly intact, nares w/o erythema or drainage, oropharynx w/o Erythema/Exudate,  Eyes: PERRLA, EOMI.  Neck: Supple, no nuchal rigidity.  No JVD.  Pulmonary:  Good air movement,  no use of accessory muscles.  Cardiac: RRR, normal S1, S2 Vascular: Thrill and bruit are present in the AV fistula. Vessel Right Left  Radial Palpable Palpable                                   Gastrointestinal: soft, non-tender/non-distended. No guarding/reflex.  Musculoskeletal: M/S 5/5 throughout.  Extremities without ischemic changes.  No deformity or atrophy.  Neurologic: CN 2-12 intact. Pain and light touch intact in extremities.  Symmetrical.  Speech is fluent. Motor exam as listed above. Psychiatric: Judgment intact, Mood & affect appropriate for pt's clinical situation. Dermatologic: No  rashes or ulcers noted.  No cellulitis or open wounds. Lymph : No Cervical, Axillary, or Inguinal lymphadenopathy.    CBC Lab Results  Component Value Date   WBC 6.6 05/06/2016   HGB 9.4 (L) 05/06/2016   HCT 27.6 (L) 05/06/2016   MCV 91.8 05/06/2016   PLT 277 05/06/2016    BMET    Component Value Date/Time   NA 135 05/06/2016 0815   NA 141 09/09/2014 1357   K 4.7 05/06/2016 0815   K 4.3 09/09/2014 1357   CL 99 (L) 05/06/2016 0815   CL 108 (H) 09/09/2014 1357   CO2 26 05/06/2016 0815   CO2 25 09/09/2014 1357   GLUCOSE 88 05/06/2016 0815   GLUCOSE 173 (H) 09/09/2014 1357   BUN 32 (H) 05/06/2016  0815   BUN 41 (H) 09/09/2014 1357   CREATININE 6.42 (H) 05/06/2016 0815   CREATININE 1.47 (H) 09/09/2014 1357   CALCIUM 8.3 (L) 05/06/2016 0815   CALCIUM 8.4 (L) 09/09/2014 1357   GFRNONAA 6 (L) 05/06/2016 0815   GFRNONAA 38 (L) 09/09/2014 1357   GFRAA 7 (L) 05/06/2016 0815   GFRAA 46 (L) 09/09/2014 1357   CrCl cannot be calculated (Patient's most recent lab result is older than the maximum 21 days allowed.).  COAG Lab Results  Component Value Date   INR 0.90 02/27/2016    Radiology No results found.    Assessment/Plan 1. Dysfunction of dialysis access. For further evaluation with a fistulogram today. Risks and benefits discussed. 2. End-stage renal disease. Needs better access as above. 3. Hypertension. Stable on outpatient medications. 4. Diabetes. Stable on outpatient medications.   Leotis Pain, MD  08/03/2016 2:19 PM

## 2016-08-03 NOTE — Op Note (Signed)
[facility]    OPERATIVE NOTE   PROCEDURE: 1.   Left brachiocephalic arteriovenous fistula cannulation under ultrasound guidance 2.   Left arm fistulagram including central venogram  PRE-OPERATIVE DIAGNOSIS: 1. ESRD 2. Poorly functional left brachiocephalic AVF with recent infiltration  POST-OPERATIVE DIAGNOSIS: same as above   SURGEON: Leotis Pain, MD  ANESTHESIA: local with MCS  ESTIMATED BLOOD LOSS: Minimal  FINDING(S): 1. Approximately 50-55% stenosis in the cephalic vein near the anastomosis which did not appear particularly flow-limiting. The remainder of the fistula was widely patent with only about a 15-20% stenosis in the cephalic vein near the subclavian confluence and normal central venous circulation.  SPECIMEN(S):  None  CONTRAST: 15 cc  FLUORO TIME: Less than 1 minutes  MODERATE CONSCIOUS SEDATION TIME: Approximately 15 minutes with 2 mg of Versed and 50 mcg of Fentanyl   INDICATIONS: Kendra [name] is a 65 y.o. female who presents with malfunctioning  left brachiocephalic arteriovenous fistula.  The patient is scheduled for  left arm fistulagram at the request of her dialysis center.  The patient is aware the risks include but are not limited to: bleeding, infection, thrombosis of the cannulated access, and possible anaphylactic reaction to the contrast.  The patient is aware of the risks of the procedure and elects to proceed forward.  DESCRIPTION: After full informed written consent was obtained, the patient was brought back to the angiography suite and placed supine upon the angiography table.  The patient was connected to monitoring equipment. Moderate conscious sedation was administered with a face to face encounter with the patient throughout the procedure with my supervision of the RN administering medicines and monitoring the patient's vital signs and mental status throughout from the start of the procedure until the patient was taken  to the recovery room. The  left arm was prepped and draped in the standard fashion for a percutaneous access intervention.  Under ultrasound guidance, the  arterial access site of the left brachiocephalic arteriovenous fistula was cannulated with a micropuncture needle under direct ultrasound guidance and a permanent image was performed.  The microwire was advanced into the fistula and the needle was exchanged for the a microsheath.  I then upsized to a 6 Fr Sheath and imaging was performed.  Hand injections were completed to image the access including the central venous system including evaluating the arterial to venous anastomosis with compression of the fistula. This demonstrated approximately 50-55% stenosis in the cephalic vein near the anastomosis which did not appear particularly flow-limiting. The remainder of the fistula was widely patent with only about a 15-20% stenosis in the cephalic vein near the subclavian confluence and normal central venous circulation..  Based on the images, this patient will not need intervention at this time. The wire was removed from the sheath.  A 4-0 Monocryl purse-string suture was sewn around the sheath.  The sheath was removed while tying down the suture.  A sterile bandage was applied to the puncture site.  COMPLICATIONS: None  CONDITION: Stable   Leotis Pain  08/03/2016 2:53 PM   This note was created with Dragon Medical transcription system. Any errors in dictation are purely unintentional.

## 2016-08-03 NOTE — Progress Notes (Signed)
VIR lab notified of patients serum potassium result and that she ate toast at 1000am.

## 2016-08-04 ENCOUNTER — Encounter: Payer: Self-pay | Admitting: Vascular Surgery

## 2016-08-07 ENCOUNTER — Encounter: Payer: Self-pay | Admitting: *Deleted

## 2016-08-07 ENCOUNTER — Ambulatory Visit
Admission: RE | Admit: 2016-08-07 | Discharge: 2016-08-07 | Disposition: A | Discharge status: Home / Self Care | Payer: Medicare Other | Source: Ambulatory Visit | Attending: Vascular Surgery | Admitting: Vascular Surgery

## 2016-08-07 ENCOUNTER — Encounter
Admission: RE | Disposition: A | Discharge status: Home / Self Care | Payer: Self-pay | Source: Ambulatory Visit | Attending: Vascular Surgery

## 2016-08-07 DIAGNOSIS — T82898A Other specified complication of vascular prosthetic devices, implants and grafts, initial encounter: Secondary | ICD-10-CM | POA: Diagnosis present

## 2016-08-07 DIAGNOSIS — E1122 Type 2 diabetes mellitus with diabetic chronic kidney disease: Secondary | ICD-10-CM | POA: Diagnosis not present

## 2016-08-07 DIAGNOSIS — Z992 Dependence on renal dialysis: Secondary | ICD-10-CM | POA: Insufficient documentation

## 2016-08-07 DIAGNOSIS — Z794 Long term (current) use of insulin: Secondary | ICD-10-CM | POA: Diagnosis not present

## 2016-08-07 DIAGNOSIS — Y832 Surgical operation with anastomosis, bypass or graft as the cause of abnormal reaction of the patient, or of later complication, without mention of misadventure at the time of the procedure: Secondary | ICD-10-CM | POA: Diagnosis not present

## 2016-08-07 DIAGNOSIS — E11319 Type 2 diabetes mellitus with unspecified diabetic retinopathy without macular edema: Secondary | ICD-10-CM | POA: Diagnosis not present

## 2016-08-07 DIAGNOSIS — N2581 Secondary hyperparathyroidism of renal origin: Secondary | ICD-10-CM | POA: Insufficient documentation

## 2016-08-07 DIAGNOSIS — Z841 Family history of disorders of kidney and ureter: Secondary | ICD-10-CM | POA: Diagnosis not present

## 2016-08-07 DIAGNOSIS — E1140 Type 2 diabetes mellitus with diabetic neuropathy, unspecified: Secondary | ICD-10-CM | POA: Diagnosis not present

## 2016-08-07 DIAGNOSIS — I120 Hypertensive chronic kidney disease with stage 5 chronic kidney disease or end stage renal disease: Secondary | ICD-10-CM | POA: Diagnosis not present

## 2016-08-07 DIAGNOSIS — Z90710 Acquired absence of both cervix and uterus: Secondary | ICD-10-CM | POA: Diagnosis not present

## 2016-08-07 DIAGNOSIS — N186 End stage renal disease: Secondary | ICD-10-CM | POA: Diagnosis not present

## 2016-08-07 DIAGNOSIS — Z9049 Acquired absence of other specified parts of digestive tract: Secondary | ICD-10-CM | POA: Diagnosis not present

## 2016-08-07 DIAGNOSIS — M19011 Primary osteoarthritis, right shoulder: Secondary | ICD-10-CM | POA: Diagnosis not present

## 2016-08-07 DIAGNOSIS — Z833 Family history of diabetes mellitus: Secondary | ICD-10-CM | POA: Diagnosis not present

## 2016-08-07 DIAGNOSIS — D631 Anemia in chronic kidney disease: Secondary | ICD-10-CM | POA: Diagnosis not present

## 2016-08-07 DIAGNOSIS — M19012 Primary osteoarthritis, left shoulder: Secondary | ICD-10-CM | POA: Diagnosis not present

## 2016-08-07 DIAGNOSIS — T82858A Stenosis of vascular prosthetic devices, implants and grafts, initial encounter: Secondary | ICD-10-CM | POA: Diagnosis not present

## 2016-08-07 DIAGNOSIS — K219 Gastro-esophageal reflux disease without esophagitis: Secondary | ICD-10-CM | POA: Diagnosis not present

## 2016-08-07 HISTORY — PX: PERIPHERAL VASCULAR CATHETERIZATION: SHX172C

## 2016-08-07 LAB — POTASSIUM (ARMC VASCULAR LAB ONLY): Potassium (ARMC vascular lab): 4.9 (ref 3.5–5.1)

## 2016-08-07 SURGERY — A/V SHUNTOGRAM/FISTULAGRAM
Anesthesia: Moderate Sedation | Laterality: Left

## 2016-08-07 MED ORDER — MIDAZOLAM HCL 2 MG/2ML IJ SOLN
INTRAMUSCULAR | Status: DC | PRN
  Administered 2016-08-07: 2 mg via INTRAVENOUS
  Administered 2016-08-07: 1 mg via INTRAVENOUS

## 2016-08-07 MED ORDER — DEXTROSE 5 % IV SOLN
INTRAVENOUS | Status: AC
  Filled 2016-08-07: qty 1.5

## 2016-08-07 MED ORDER — FENTANYL CITRATE (PF) 100 MCG/2ML IJ SOLN
INTRAMUSCULAR | Status: AC
  Filled 2016-08-07: qty 2

## 2016-08-07 MED ORDER — HEPARIN (PORCINE) IN NACL 2-0.9 UNIT/ML-% IJ SOLN
INTRAMUSCULAR | Status: AC
  Filled 2016-08-07: qty 500

## 2016-08-07 MED ORDER — FENTANYL CITRATE (PF) 100 MCG/2ML IJ SOLN
INTRAMUSCULAR | Status: DC | PRN
  Administered 2016-08-07 (×3): 50 ug via INTRAVENOUS

## 2016-08-07 MED ORDER — MIDAZOLAM HCL 2 MG/2ML IJ SOLN
INTRAMUSCULAR | Status: AC
  Filled 2016-08-07: qty 2

## 2016-08-07 MED ORDER — DEXTROSE 5 % IV SOLN
1.5000 g | Freq: Once | INTRAVENOUS | Status: AC
  Administered 2016-08-07: 1.5 g via INTRAVENOUS

## 2016-08-07 MED ORDER — HEPARIN SODIUM (PORCINE) 1000 UNIT/ML IJ SOLN
INTRAMUSCULAR | Status: DC | PRN
  Administered 2016-08-07: 3000 [IU] via INTRAVENOUS

## 2016-08-07 MED ORDER — HEPARIN SODIUM (PORCINE) 1000 UNIT/ML IJ SOLN
INTRAMUSCULAR | Status: AC
  Filled 2016-08-07: qty 1

## 2016-08-07 MED ORDER — LIDOCAINE-EPINEPHRINE (PF) 1 %-1:200000 IJ SOLN
INTRAMUSCULAR | Status: AC
  Filled 2016-08-07: qty 30

## 2016-08-07 SURGICAL SUPPLY — 15 items
BALLN LUTONIX DCB 5X60X130 (BALLOONS) ×3
BALLOON LUTONIX DCB 5X60X130 (BALLOONS) ×1 IMPLANT
CATH TORCON 5FR 0.38 (CATHETERS) ×3 IMPLANT
DEVICE PRESTO INFLATION (MISCELLANEOUS) ×3 IMPLANT
DRAPE BRACHIAL (DRAPES) ×6 IMPLANT
GUIDEWIRE ANGLED .035 180CM (WIRE) ×3 IMPLANT
NEEDLE ENTRY 21GA 7CM ECHOTIP (NEEDLE) ×3 IMPLANT
PACK ANGIOGRAPHY (CUSTOM PROCEDURE TRAY) ×3 IMPLANT
SET INTRO CAPELLA COAXIAL (SET/KITS/TRAYS/PACK) ×3 IMPLANT
SHEATH BRITE TIP 6FRX5.5 (SHEATH) ×3 IMPLANT
SUT MNCRL 4-0 (SUTURE) ×2
SUT MNCRL 4-0 27XMFL (SUTURE) ×1
SUTURE MNCRL 4-0 27XMF (SUTURE) ×1 IMPLANT
TOWEL OR 17X26 4PK STRL BLUE (TOWEL DISPOSABLE) ×3 IMPLANT
WIRE MAGIC TORQUE 260C (WIRE) ×3 IMPLANT

## 2016-08-07 NOTE — H&P (Signed)
[facility] History & Physical Update  The patient was interviewed and re-examined.  The patient's previous History and Physical has been reviewed and is unchanged.  There is no change in the plan of care. We plan to proceed with the scheduled procedure.  Hortencia Pilar, MD  08/07/2016, 2:53 PM

## 2016-08-07 NOTE — Discharge Instructions (Signed)
Fistulogram, Care After °Refer to this sheet in the next few weeks. These instructions provide you with information on caring for yourself after your procedure. Your health care provider may also give you more specific instructions. Your treatment has been planned according to current medical practices, but problems sometimes occur. Call your health care provider if you have any problems or questions after your procedure. °WHAT TO EXPECT AFTER THE PROCEDURE °After your procedure, it is typical to have the following: °· A small amount of discomfort in the area where the catheters were placed. °· A small amount of bruising around the fistula. °· Sleepiness and fatigue. °HOME CARE INSTRUCTIONS °· Rest at home for the day following your procedure. °· Do not drive or operate heavy machinery while taking pain medicine. °· Take medicines only as directed by your health care provider. °· Do not take baths, swim, or use a hot tub until your health care provider approves. You may shower 24 hours after the procedure or as directed by your health care provider. °· There are many different ways to close and cover an incision, including stitches, skin glue, and adhesive strips. Follow your health care provider's instructions on: °¨ Incision care. °¨ Bandage (dressing) changes and removal. °¨ Incision closure removal. °· Monitor your dialysis fistula carefully. °SEEK MEDICAL CARE IF: °· You have drainage, redness, swelling, or pain at your catheter site. °· You have a fever. °· You have chills. °SEEK IMMEDIATE MEDICAL CARE IF: °· You feel weak. °· You have trouble balancing. °· You have trouble moving your arms or legs. °· You have problems with your speech or vision. °· You can no longer feel a vibration or buzz when you put your fingers over your dialysis fistula. °· The limb that was used for the procedure: °¨ Swells. °¨ Is painful. °¨ Is cold. °¨ Is discolored, such as blue or pale white. °  °This information is not intended  to replace advice given to you by your health care provider. Make sure you discuss any questions you have with your health care provider. °  °Document Released: 02/19/2014 Document Reviewed: 02/19/2014 °Elsevier Interactive Patient Education ©2016 [employer] ° °

## 2016-08-07 NOTE — Op Note (Signed)
OPERATIVE NOTE   PROCEDURE: 1. Contrast injection left brachiocephalic  AV access 2. Percutaneous transluminal angioplasty left brachial cephalic fistula in the peripheral portion  PRE-OPERATIVE DIAGNOSIS: Complication of dialysis access                                                       End Stage Renal Disease  POST-OPERATIVE DIAGNOSIS: same as above   SURGEON: Katha Cabal, M.D.  ANESTHESIA: Conscious sedation was administered under my direct supervision. IV Versed plus fentanyl were utilized. Continuous ECG, pulse oximetry and blood pressure was monitored throughout the entire procedure.  Conscious sedation was for a total of 35.  ESTIMATED BLOOD LOSS: minimal  FINDING(S): Stricture of the AV graft  SPECIMEN(S):  None  CONTRAST: 20 cc  FLUOROSCOPY TIME: 1.8 minutes  INDICATIONS: Kendra [name] is a 65 y.o. female who  presents with malfunctioning left brachiocephalic AV access.  The patient is scheduled for angiography with possible intervention of the AV access.  The patient is aware the risks include but are not limited to: bleeding, infection, thrombosis of the cannulated access, and possible anaphylactic reaction to the contrast.  The patient acknowledges if the access can not be salvaged a tunneled catheter will be needed and will be placed during this procedure.  The patient is aware of the risks of the procedure and elects to proceed with the angiogram and intervention.  DESCRIPTION: After full informed written consent was obtained, the patient was brought back to the Special Procedure suite and placed supine position.  Appropriate cardiopulmonary monitors were placed.  The left arm was prepped and draped in the standard fashion.  Appropriate timeout is called. The left brachiocephalic fistula  was cannulated with a micropuncture needle in a retrograde direction.  Cannulation was performed with ultrasound guidance. Ultrasound was placed in a sterile sleeve, the AV  access was interrogated and noted to be echolucent and compressible indicating patency. Image was recorded for the permanent record. The puncture is performed under continuous ultrasound visualization.   The microwire was advanced and the needle was exchanged for  a microsheath.  The J-wire was then advanced and a 6 Fr sheath inserted.  Hand injections were completed to image the access from the arterial anastomosis through the entire access.  The central venous structures were also imaged by hand injections. The arterial portion of the graft was imaged by using a floppy Glidewire and advancing it into the proximal brachial artery through the anastomosis subsequently tracking a KMP catheter over it. With the KMP catheter positioned in the mid brachial artery hand injection contrast was used to demonstrate the anatomy of the fistula from the anastomosis more proximally.  Based on the images,  3000 units of heparin was given and a wire was negotiated through the strictures within the venous portion of the graft.  An 5 x 60 Lutonix balloon was used.  Inflation was to 14 atm for approximately 2 minutes.  The KMP catheter was then negotiated over the wire into the mid brachial artery. The wire was removed. Follow-up imaging demonstrates complete resolution of the stricture with rapid flow of contrast through the graft, the central venous anatomy is preserved.  A 4-0 Monocryl purse-string suture was sewn around the sheath.  The sheath was removed and light pressure was applied.  A sterile bandage  was applied to the puncture site.  Interpretation:  Just above the arterial anastomosis there is a area 3-4 cm in length of greater than 80% narrowing. Visualized portions of the brachial artery are widely patent. The more proximal fistula in the area that is being cannulated is widely patent. Several tributaries are noted but they do not appear to be stealing a significant amount of flow through the fistula. Central  veins are widely patent. As noted above following angioplasty the stricture at the arterial portion is well treated with less than 10% residual stenosis.  COMPLICATIONS: None  CONDITION: Kendra [name], M.D [facility] Office: [phone]  08/07/2016 5:16 PM

## 2016-08-09 IMAGING — CR DG CHEST 1V PORT
1 series · 1 of 1 positions shown · non-contrast
Comparison: None.

CLINICAL DATA: Shortness of breath with headache and nausea.
Chronic renal failure. Left-sided chest pain and

EXAM:
PORTABLE CHEST 1 VIEW

[chest ap]
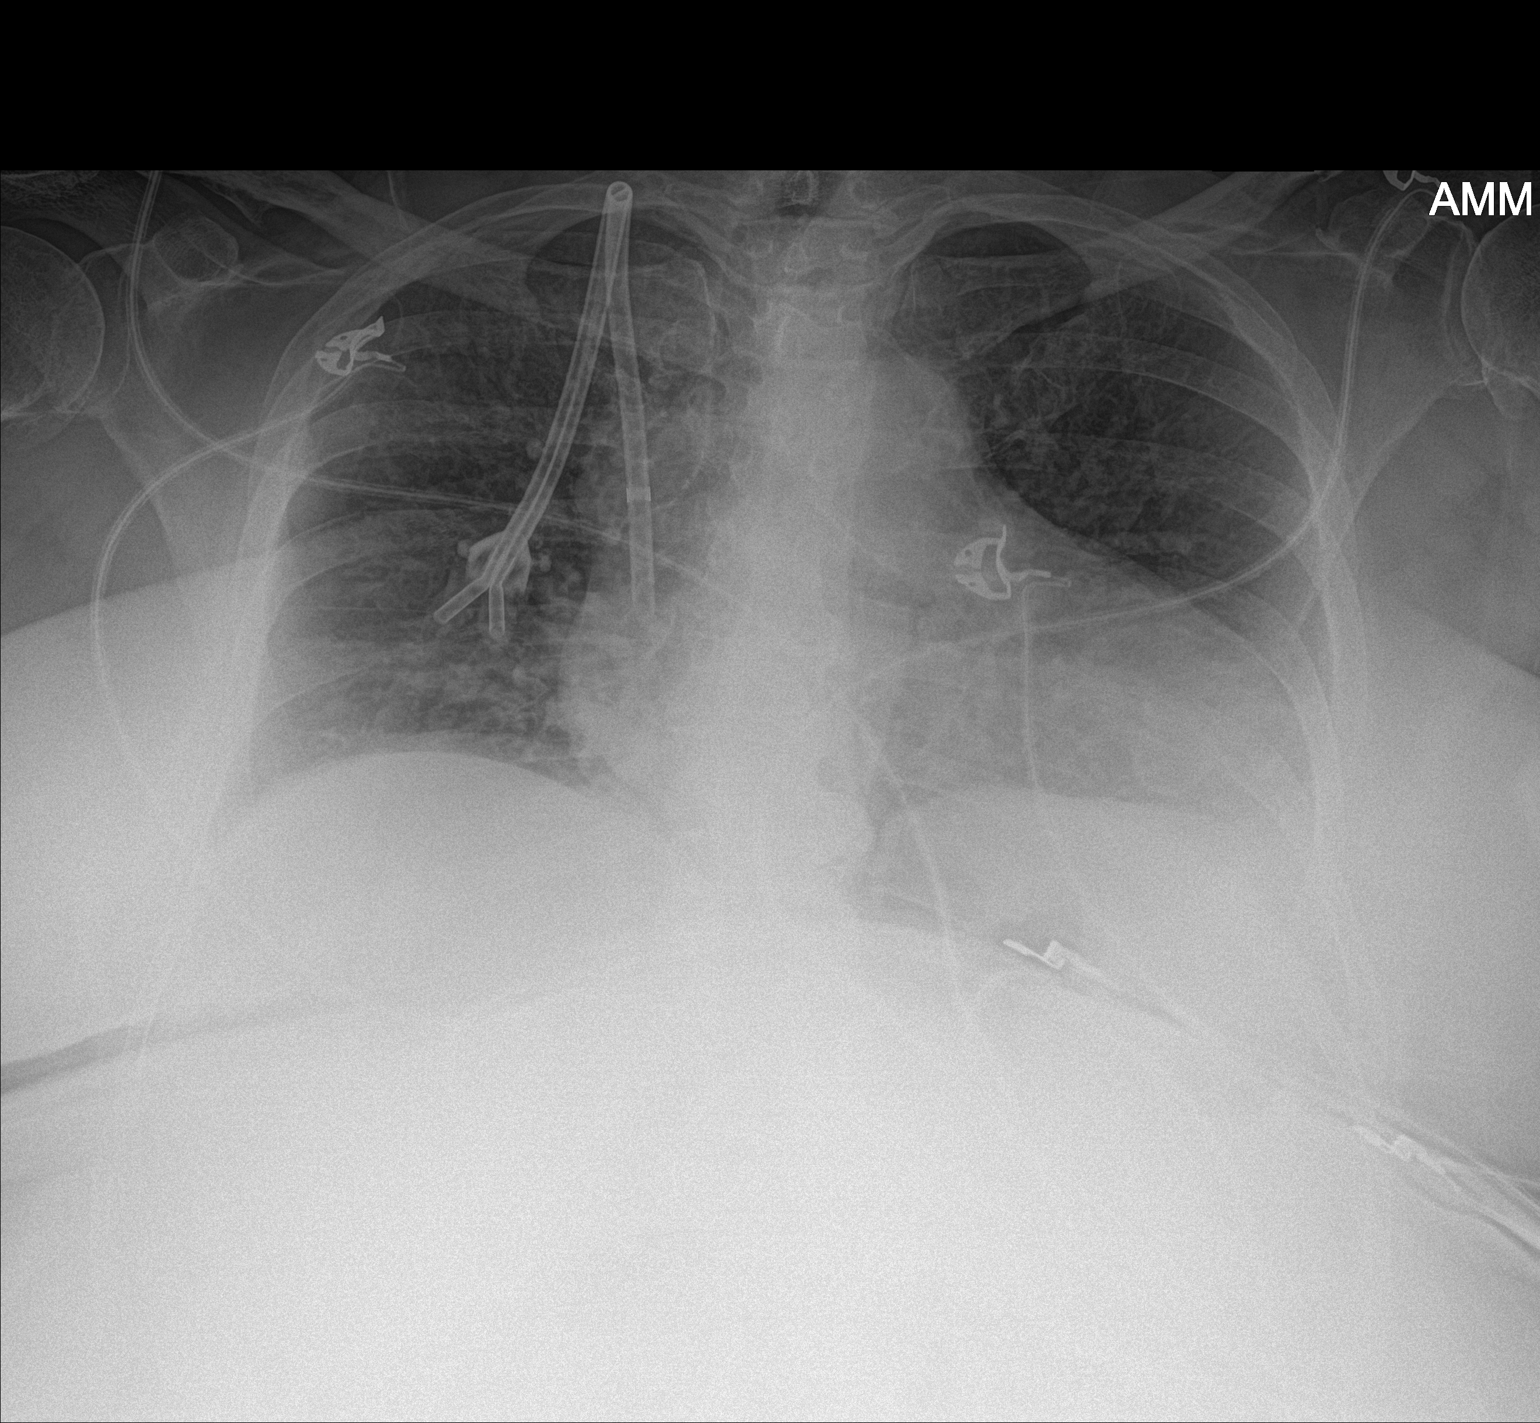

[1 of 1 positions shown; findings below may reference images not displayed]

FINDINGS: There is no edema or consolidation. Heart is enlarged with pulmonary
vascularity within normal limits. Central catheter tip is in the
superior vena cava near the cavoatrial junction. No pneumothorax.
There is atherosclerotic calcification in the aortic arch. No
blastic or lytic bone lesions are evident.
IMPRESSION: No edema or consolidation. Mild cardiomegaly. Aortic
atherosclerosis.

## 2016-08-09 IMAGING — CT CT HEAD W/O CM
3 series · 15 of 47 positions shown, 18 images · non-contrast
Comparison: Head CT 09/09/2014

CLINICAL DATA: Frontal headache following dialysis yesterday. No
trauma.

EXAM:
CT HEAD WITHOUT CONTRAST
TECHNIQUE: Contiguous axial images were obtained from the base of the skull
through the vertex without intravenous contrast.

[Series 2: head wo · axial · 0.42mm/px · z∈[+304,+430]mm · 9 of 31 slices shown, 12 images]
[im 3/31  brain]
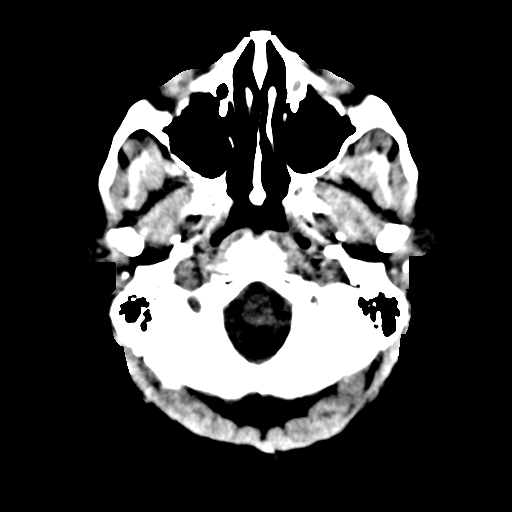
[im 3/31  bone]
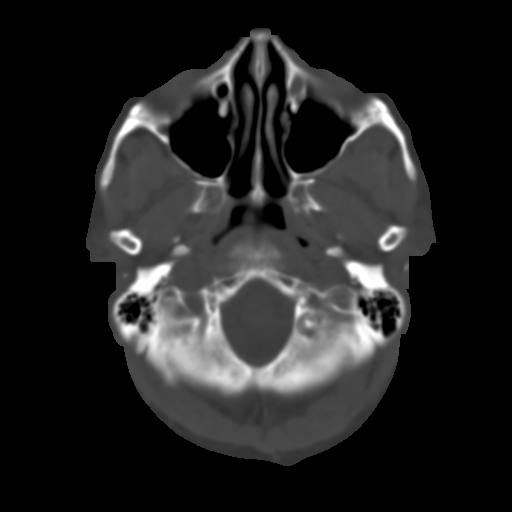
[im 6/31  brain]
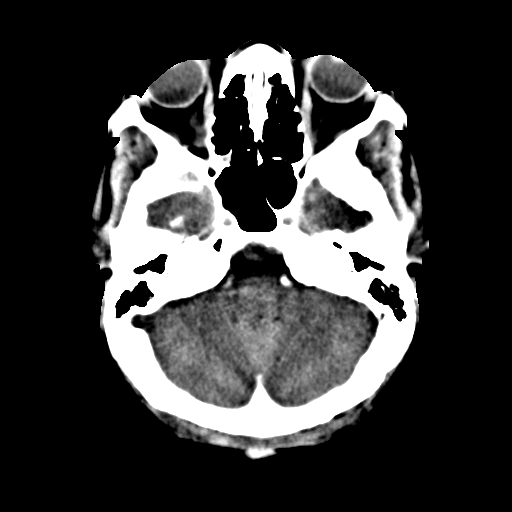
[im 9/31  brain]
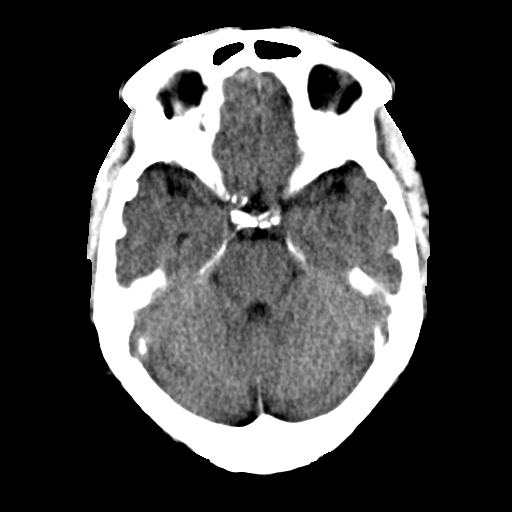
[im 12/31  brain]
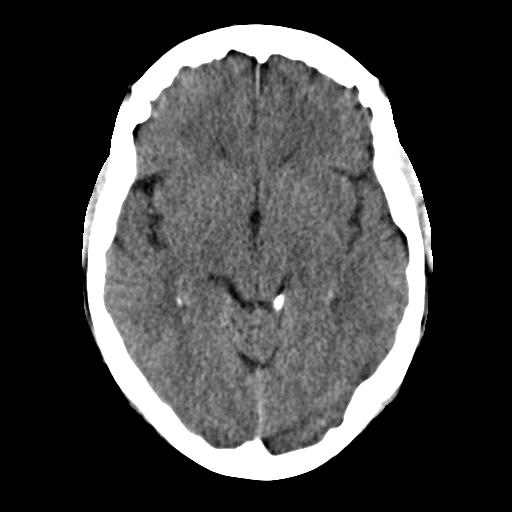
[im 16/31  brain]
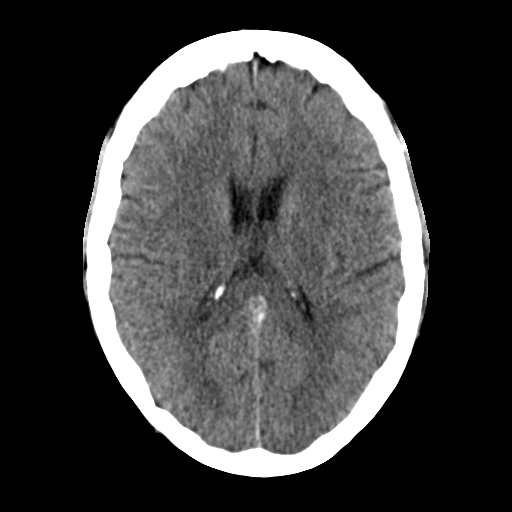
[im 16/31  bone]
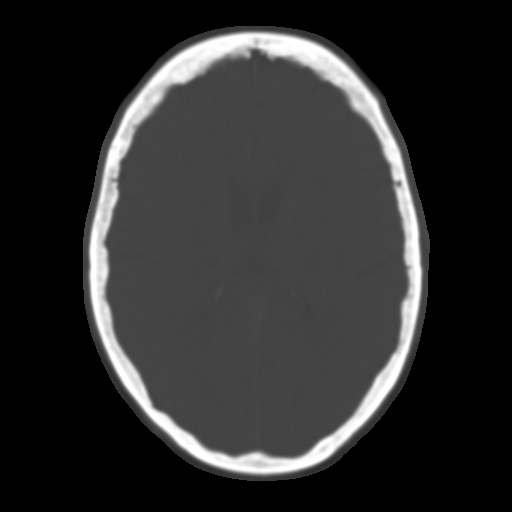
[im 19/31  brain]
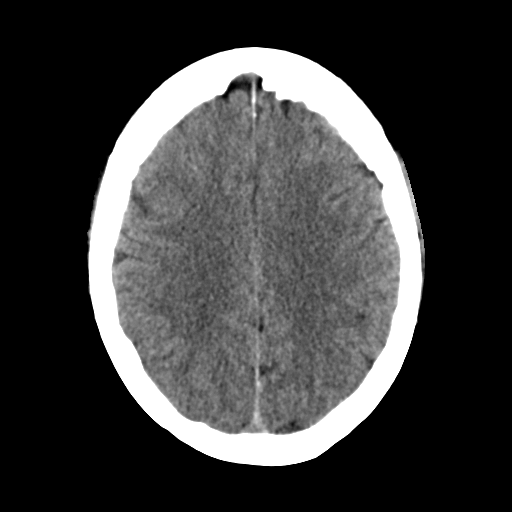
[im 22/31  brain]
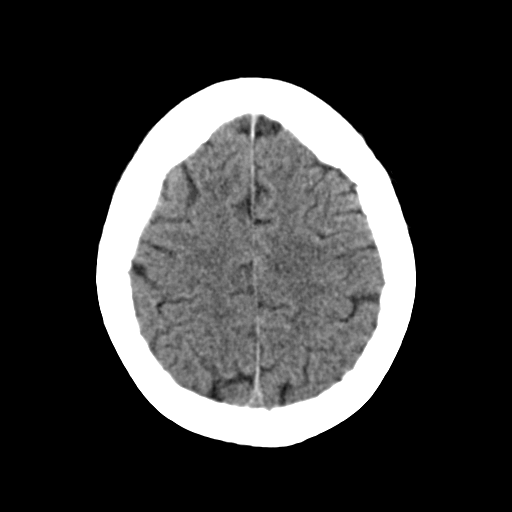
[im 25/31  brain]
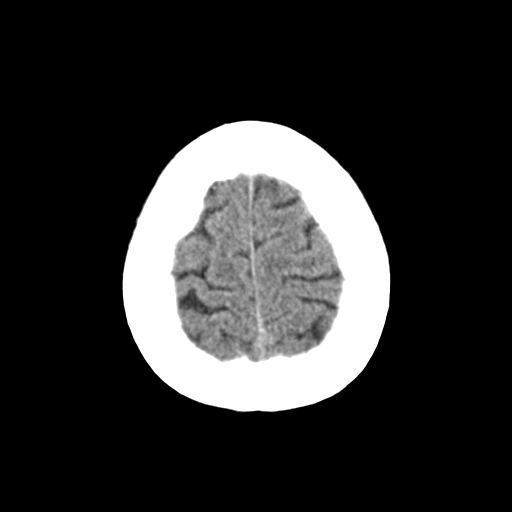
[im 28/31  brain]
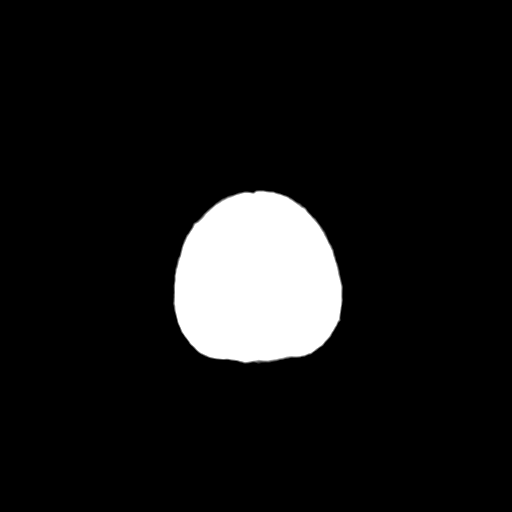
[im 28/31  bone]
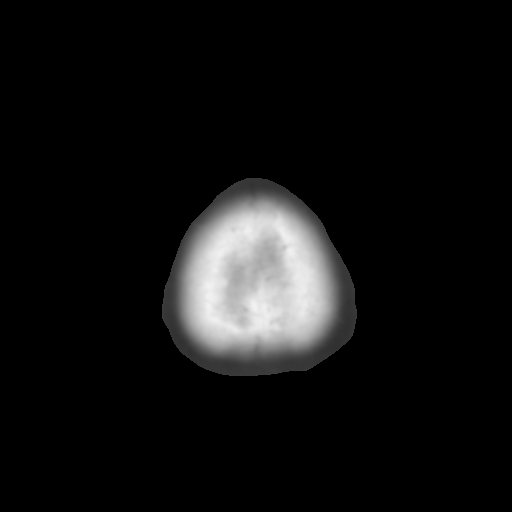

[Series 4: coronal soft · coronal · 0.29mm/px · 3 of 67 slices shown]
[im 23/67  brain]
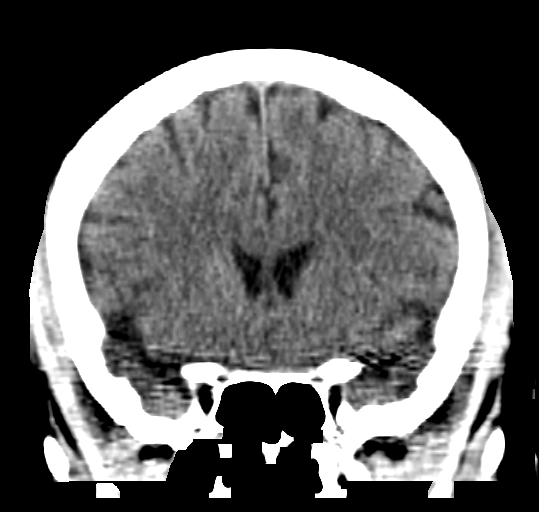
[im 30/67  brain]
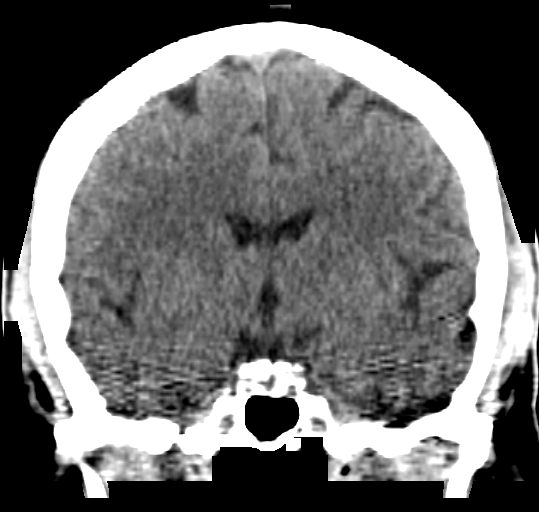
[im 37/67  brain]
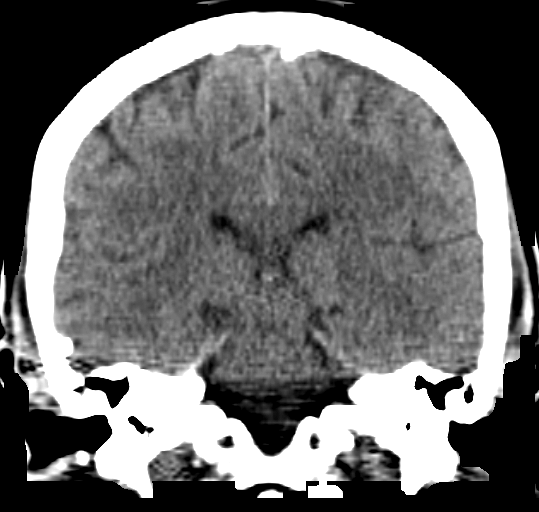

[Series 5: sagittal soft · sagittal · 0.29mm/px · 3 of 52 slices shown]
[im 18/52  brain]
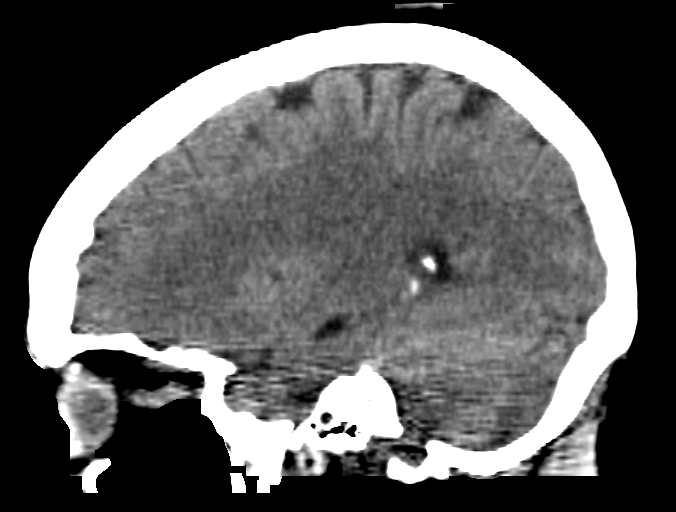
[im 26/52  brain]
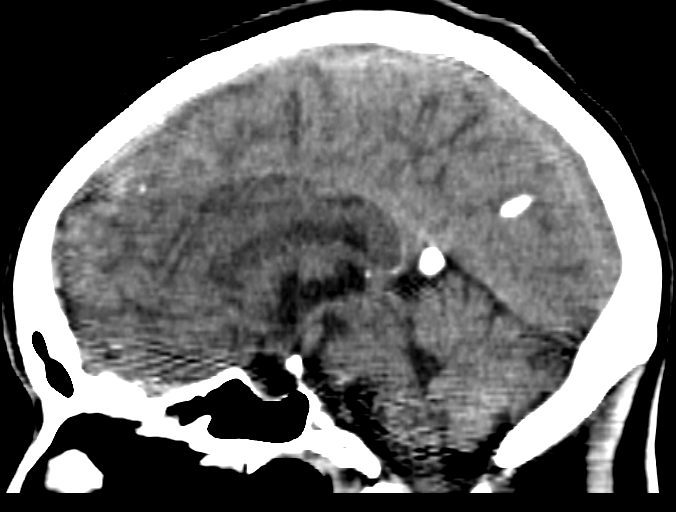
[im 35/52  brain]
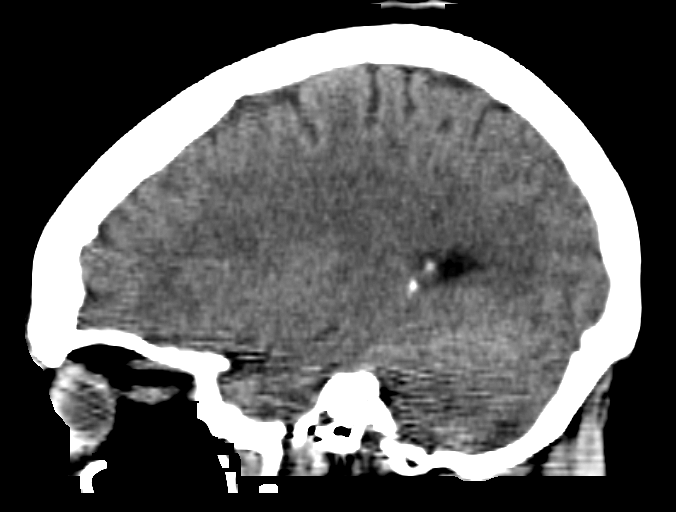

[15 of 47 positions shown; findings below may reference images not displayed]

FINDINGS: Brain parenchyma: Normal for age. The basal ganglia and insular
ribbons are preserved. No hyperdense vessel is identified. No
intraparenchymal hemorrhage or evidence of acute cortical infarct.
No mass lesion or midline shift.

Ventricles, sulci and extra-axial spaces: Normal for age. No
extra-axial collection. Calcification along the left tentorial
leaflet is unchanged.

Paranasal sinuses and mastoids: Normal.

Visualized orbits: Normal.

Skull and extracranial soft tissues: Normal.
IMPRESSION: No acute intracranial abnormality.  Normal brain.

## 2016-08-10 ENCOUNTER — Encounter: Payer: Self-pay | Admitting: Vascular Surgery

## 2016-08-18 ENCOUNTER — Encounter (INDEPENDENT_AMBULATORY_CARE_PROVIDER_SITE_OTHER): Payer: Self-pay

## 2016-08-18 ENCOUNTER — Ambulatory Visit (INDEPENDENT_AMBULATORY_CARE_PROVIDER_SITE_OTHER): Payer: Self-pay | Admitting: Vascular Surgery

## 2016-09-15 ENCOUNTER — Other Ambulatory Visit (INDEPENDENT_AMBULATORY_CARE_PROVIDER_SITE_OTHER): Payer: Self-pay | Admitting: Vascular Surgery

## 2016-09-15 DIAGNOSIS — Z992 Dependence on renal dialysis: Principal | ICD-10-CM

## 2016-09-15 DIAGNOSIS — T82590A Other mechanical complication of surgically created arteriovenous fistula, initial encounter: Secondary | ICD-10-CM

## 2016-09-15 DIAGNOSIS — N186 End stage renal disease: Secondary | ICD-10-CM

## 2016-09-17 ENCOUNTER — Ambulatory Visit (INDEPENDENT_AMBULATORY_CARE_PROVIDER_SITE_OTHER): Payer: Medicare Other

## 2016-09-17 ENCOUNTER — Encounter (INDEPENDENT_AMBULATORY_CARE_PROVIDER_SITE_OTHER): Payer: Self-pay | Admitting: Vascular Surgery

## 2016-09-17 ENCOUNTER — Ambulatory Visit (INDEPENDENT_AMBULATORY_CARE_PROVIDER_SITE_OTHER): Payer: Medicare Other | Admitting: Vascular Surgery

## 2016-09-17 ENCOUNTER — Ambulatory Visit (INDEPENDENT_AMBULATORY_CARE_PROVIDER_SITE_OTHER): Payer: Self-pay | Admitting: Vascular Surgery

## 2016-09-17 VITALS — BP 139/74 | HR 87 | Resp 16 | Ht 66.0 in | Wt 217.6 lb

## 2016-09-17 DIAGNOSIS — Z992 Dependence on renal dialysis: Secondary | ICD-10-CM

## 2016-09-17 DIAGNOSIS — N186 End stage renal disease: Secondary | ICD-10-CM

## 2016-09-17 DIAGNOSIS — T829XXD Unspecified complication of cardiac and vascular prosthetic device, implant and graft, subsequent encounter: Secondary | ICD-10-CM

## 2016-09-17 DIAGNOSIS — E118 Type 2 diabetes mellitus with unspecified complications: Secondary | ICD-10-CM | POA: Diagnosis not present

## 2016-09-17 DIAGNOSIS — T82590A Other mechanical complication of surgically created arteriovenous fistula, initial encounter: Secondary | ICD-10-CM | POA: Diagnosis not present

## 2016-09-21 ENCOUNTER — Other Ambulatory Visit (INDEPENDENT_AMBULATORY_CARE_PROVIDER_SITE_OTHER): Payer: Self-pay | Admitting: Vascular Surgery

## 2016-09-21 DIAGNOSIS — N186 End stage renal disease: Secondary | ICD-10-CM | POA: Insufficient documentation

## 2016-09-21 DIAGNOSIS — Z992 Dependence on renal dialysis: Secondary | ICD-10-CM

## 2016-09-21 DIAGNOSIS — T829XXA Unspecified complication of cardiac and vascular prosthetic device, implant and graft, initial encounter: Secondary | ICD-10-CM | POA: Insufficient documentation

## 2016-09-21 NOTE — Progress Notes (Signed)
Subjective:    Patient ID: Kendra [name], female    DOB: 07/10/1951, 65 y.o.   MRN: [id number] Chief Complaint  Patient presents with  . Follow-up   Patient presents for her first post-procedure follow up. She is s/p a left upper extremity fistulogram on 08/07/16. The patient reports an uncomplicated post-procedure course. No issues with dialysis. The patient underwent a duplex ultrasound of the AV access which was notable for a patent fistula with a new pseudoaneurysm noted measuring 2.16cm x 1.08cm x 1.97cm just proximal to the arterial anastomosis. The patient denies any fistula skin breakdown, pain, edema, pallor or ulceration of the arm / hand.        Review of Systems  Constitutional: Negative.   HENT: Negative.   Eyes: Negative.   Respiratory: Negative.   Cardiovascular: Negative.   Gastrointestinal: Negative.   Endocrine: Negative.   Genitourinary:       ESRD  Musculoskeletal: Negative.   Skin: Negative.   Allergic/Immunologic: Negative.   Neurological: Negative.   Hematological: Negative.   Psychiatric/Behavioral: Negative.        Objective:   Physical Exam  Constitutional: She is oriented to person, place, and time. She appears well-developed and well-nourished.  HENT:  Head: Normocephalic and atraumatic.  Right Ear: External ear normal.  Left Ear: External ear normal.  Eyes: Conjunctivae and EOM are normal. Pupils are equal, round, and reactive to light.  Neck: Normal range of motion.  Cardiovascular: Normal rate, regular rhythm, normal heart sounds and intact distal pulses.   Pulses:      Radial pulses are 2+ on the right side, and 2+ on the left side.       Dorsalis pedis pulses are 2+ on the right side, and 2+ on the left side.       Posterior tibial pulses are 2+ on the right side, and 2+ on the left side.  Left Upper Extremity Access: Good bruit and thrill. Able to palpate pseudoaneurysm. No skin threatening noted.   Pulmonary/Chest: Effort normal  and breath sounds normal.  Abdominal: Soft. Bowel sounds are normal.  Musculoskeletal: Normal range of motion. She exhibits no edema.  Neurological: She is alert and oriented to person, place, and time.  Skin: Skin is warm and dry.  Psychiatric: She has a normal mood and affect. Her behavior is normal. Judgment and thought content normal.   BP 139/74   Pulse 87   Resp 16   Ht 5\' 6"  (1.676 m)   Wt 217 lb 9.6 oz (98.7 kg)   BMI 35.12 kg/m   Past Medical History:  Diagnosis Date  . Anemia of chronic renal failure   . Diabetic neuropathy ([place])   . Diabetic retinopathy ([place])   . GERD (gastroesophageal reflux disease)   . Hyperparathyroidism, secondary renal ([place])   . Hypertension   . Hypothyroidism   . Insulin dependent diabetes mellitus ([place])   . Osteoarthritis    bilateral shoulders  . Primary hypertension    Social History   Social History  . Marital status: Single    Spouse name: N/A  . Number of children: N/A  . Years of education: N/A   Occupational History  . Not on file.   Social History Main Topics  . Smoking status: Never Smoker  . Smokeless tobacco: Never Used  . Alcohol use No  . Drug use: No  . Sexual activity: Not Currently   Other Topics Concern  . Not on file   Social History  Narrative   Single lives alone.  She is employed. She has no impairements   Past Surgical History:  Procedure Laterality Date  . ABDOMINAL HYSTERECTOMY    . BASCILIC VEIN TRANSPOSITION Left 03/04/2016   Procedure: BASCILIC VEIN TRANSPOSITION ( STAGE 1 );  Surgeon: Algernon Huxley, MD;  Location: [facility];  Service: Vascular;  Laterality: Left;  . CHOLECYSTECTOMY    . PERIPHERAL VASCULAR CATHETERIZATION N/A 01/22/2016   Procedure: Dialysis/Perma Catheter Insertion;  Surgeon: Katha Cabal, MD;  Location: [place] [facility];  Service: Cardiovascular;  Laterality: N/A;  . PERIPHERAL VASCULAR CATHETERIZATION Left 05/14/2016   Procedure: A/V Shuntogram/Fistulagram;  Surgeon:  Algernon Huxley, MD;  Location: [place] [facility];  Service: Cardiovascular;  Laterality: Left;  . PERIPHERAL VASCULAR CATHETERIZATION N/A 05/14/2016   Procedure: A/V Shunt Intervention;  Surgeon: Algernon Huxley, MD;  Location: [place] [facility];  Service: Cardiovascular;  Laterality: N/A;  . PERIPHERAL VASCULAR CATHETERIZATION N/A 06/25/2016   Procedure: Dialysis/Perma Catheter Removal;  Surgeon: Algernon Huxley, MD;  Location: [place] [facility];  Service: Cardiovascular;  Laterality: N/A;  . PERIPHERAL VASCULAR CATHETERIZATION Left 08/03/2016   Procedure: A/V Shuntogram/Fistulagram;  Surgeon: Algernon Huxley, MD;  Location: [place] [facility];  Service: Cardiovascular;  Laterality: Left;  . PERIPHERAL VASCULAR CATHETERIZATION N/A 08/03/2016   Procedure: A/V Shunt Intervention;  Surgeon: Algernon Huxley, MD;  Location: [place] [facility];  Service: Cardiovascular;  Laterality: N/A;  . PERIPHERAL VASCULAR CATHETERIZATION Left 08/07/2016   Procedure: A/V Shuntogram/Fistulagram;  Surgeon: Katha Cabal, MD;  Location: [place] [facility];  Service: Cardiovascular;  Laterality: Left;  . THYROIDECTOMY     Family History  Problem Relation Age of Onset  . Renal Disease Brother     with renal transplant  . Diabetes Mellitus II Father   . Diabetes Mellitus II Mother   . Diabetes Mellitus II Brother    No Known Allergies     Assessment & Plan:  Patient presents for her first post-procedure follow up. She is s/p a left upper extremity fistulogram on 08/07/16. The patient reports an uncomplicated post-procedure course. No issues with dialysis. The patient underwent a duplex ultrasound of the AV access which was notable for a patent fistula with a new pseudoaneurysm noted measuring 2.16cm x 1.08cm x 1.97cm just proximal to the arterial anastomosis. The patient denies any fistula skin breakdown, pain, edema, pallor or ulceration of the arm / hand.   1. ESRD on dialysis ([place]) - Stable New appearance and  growth of pseudoaneurysm concerning for hemorraghic event.  Recommend left upper extremity fistulogram with stent placement to seal pseudoaneurysm. Procedure, risks and benefits explained to patient.  All questions answered. Patient wishes to proceed.  The patient should continue to have duplex ultrasounds of the dialysis access every three to four months. The patient was instructed to call the office in the interim if any issues with dialysis access / doppler flow, pain, edema, pallor, fistula skin breakdown or ulceration of the arm / hand occur. The patient expressed their understanding.  2. Complication from renal dialysis device, subsequent encounter - Worsening New appearance and growth of pseudoaneurysm concerning for hemorraghic event.  Recommend left upper extremity fistulogram with stent placement to seal pseudoaneurysm. Procedure, risks and benefits explained to patient.  All questions answered. Patient wishes to proceed.  The patient should continue to have duplex ultrasounds of the dialysis access every three to four months. The patient was instructed to call the  office in the interim if any issues with dialysis access / doppler flow, pain, edema, pallor, fistula skin breakdown or ulceration of the arm / hand occur. The patient expressed their understanding.  3. Type 2 diabetes mellitus with complication, unspecified long term insulin use status ([place]) - Stable Encouraged good control as its slows the progression of atherosclerotic disease  Current Outpatient Prescriptions on File Prior to Visit  Medication Sig Dispense Refill  . acetaminophen (TYLENOL) 500 MG tablet Take 500 mg by mouth every 6 (six) hours as needed for mild pain.     . Calcium Acetate 668 (169 Ca) MG TABS Take 1 capsule by mouth 3 (three) times daily with meals.    . furosemide (LASIX) 40 MG tablet Take 80 mg by mouth daily as needed for fluid.     [name] insulin aspart (NOVOLOG FLEXPEN) 100 UNIT/ML FlexPen Inject  8-12 Units into the skin 3 (three) times daily.     . insulin glargine (LANTUS) 100 UNIT/ML injection Inject 30 Units into the skin at bedtime. 25- 30 units at bedtime depending on glucose serum levels    . Insulin Pen Needle (PEN NEEDLES 31GX5/16") 31G X 8 MM MISC Use for insulin injection TID    . promethazine (PHENERGAN) 25 MG tablet Take 25 mg by mouth every 6 (six) hours as needed for nausea or vomiting.    . sennosides-docusate sodium (SENOKOT-S) 8.6-50 MG tablet Take 1 tablet by mouth daily as needed for constipation. 1-2 tabs    . traMADol (ULTRAM) 50 MG tablet Take 50 mg by mouth every 6 (six) hours as needed for moderate pain.      No current facility-administered medications on file prior to visit.     There are no Patient Instructions on file for this visit. No Follow-up on file.   Geoff Dacanay A Reed Dady, PA-C

## 2016-09-22 ENCOUNTER — Encounter
Admission: RE | Disposition: A | Discharge status: Home / Self Care | Payer: Self-pay | Source: Ambulatory Visit | Attending: Vascular Surgery

## 2016-09-22 ENCOUNTER — Ambulatory Visit
Admission: RE | Admit: 2016-09-22 | Discharge: 2016-09-22 | Disposition: A | Discharge status: Home / Self Care | Payer: Medicare Other | Source: Ambulatory Visit | Attending: Vascular Surgery | Admitting: Vascular Surgery

## 2016-09-22 ENCOUNTER — Encounter: Payer: Self-pay | Admitting: *Deleted

## 2016-09-22 DIAGNOSIS — Z794 Long term (current) use of insulin: Secondary | ICD-10-CM | POA: Diagnosis not present

## 2016-09-22 DIAGNOSIS — K219 Gastro-esophageal reflux disease without esophagitis: Secondary | ICD-10-CM | POA: Diagnosis not present

## 2016-09-22 DIAGNOSIS — Y832 Surgical operation with anastomosis, bypass or graft as the cause of abnormal reaction of the patient, or of later complication, without mention of misadventure at the time of the procedure: Secondary | ICD-10-CM | POA: Insufficient documentation

## 2016-09-22 DIAGNOSIS — Z833 Family history of diabetes mellitus: Secondary | ICD-10-CM | POA: Diagnosis not present

## 2016-09-22 DIAGNOSIS — Z992 Dependence on renal dialysis: Secondary | ICD-10-CM | POA: Diagnosis not present

## 2016-09-22 DIAGNOSIS — E039 Hypothyroidism, unspecified: Secondary | ICD-10-CM | POA: Diagnosis not present

## 2016-09-22 DIAGNOSIS — Z90710 Acquired absence of both cervix and uterus: Secondary | ICD-10-CM | POA: Diagnosis not present

## 2016-09-22 DIAGNOSIS — T82898A Other specified complication of vascular prosthetic devices, implants and grafts, initial encounter: Secondary | ICD-10-CM | POA: Insufficient documentation

## 2016-09-22 DIAGNOSIS — Z841 Family history of disorders of kidney and ureter: Secondary | ICD-10-CM | POA: Diagnosis not present

## 2016-09-22 DIAGNOSIS — N186 End stage renal disease: Secondary | ICD-10-CM | POA: Insufficient documentation

## 2016-09-22 DIAGNOSIS — E11319 Type 2 diabetes mellitus with unspecified diabetic retinopathy without macular edema: Secondary | ICD-10-CM | POA: Diagnosis not present

## 2016-09-22 DIAGNOSIS — E1122 Type 2 diabetes mellitus with diabetic chronic kidney disease: Secondary | ICD-10-CM | POA: Diagnosis not present

## 2016-09-22 DIAGNOSIS — I120 Hypertensive chronic kidney disease with stage 5 chronic kidney disease or end stage renal disease: Secondary | ICD-10-CM | POA: Insufficient documentation

## 2016-09-22 DIAGNOSIS — D631 Anemia in chronic kidney disease: Secondary | ICD-10-CM | POA: Insufficient documentation

## 2016-09-22 DIAGNOSIS — Z9049 Acquired absence of other specified parts of digestive tract: Secondary | ICD-10-CM | POA: Diagnosis not present

## 2016-09-22 DIAGNOSIS — T82868A Thrombosis of vascular prosthetic devices, implants and grafts, initial encounter: Secondary | ICD-10-CM | POA: Diagnosis not present

## 2016-09-22 DIAGNOSIS — E1140 Type 2 diabetes mellitus with diabetic neuropathy, unspecified: Secondary | ICD-10-CM | POA: Insufficient documentation

## 2016-09-22 HISTORY — PX: PERIPHERAL VASCULAR CATHETERIZATION: SHX172C

## 2016-09-22 LAB — POTASSIUM (ARMC VASCULAR LAB ONLY): Potassium (ARMC vascular lab): 4.3 (ref 3.5–5.1)

## 2016-09-22 SURGERY — A/V FISTULAGRAM
Anesthesia: Moderate Sedation | Laterality: Left

## 2016-09-22 MED ORDER — METHYLPREDNISOLONE SODIUM SUCC 125 MG IJ SOLR: 125.0000 mg | INTRAMUSCULAR | Status: DC | PRN

## 2016-09-22 MED ORDER — HEPARIN SODIUM (PORCINE) 1000 UNIT/ML IJ SOLN
INTRAMUSCULAR | Status: DC | PRN
  Administered 2016-09-22: 3000 [IU] via INTRAVENOUS

## 2016-09-22 MED ORDER — LIDOCAINE HCL (PF) 1 % IJ SOLN
INTRAMUSCULAR | Status: AC
  Filled 2016-09-22: qty 30

## 2016-09-22 MED ORDER — HYDROMORPHONE HCL 1 MG/ML IJ SOLN: 1.0000 mg | Freq: Once | INTRAMUSCULAR | Status: DC

## 2016-09-22 MED ORDER — MIDAZOLAM HCL 5 MG/5ML IJ SOLN
INTRAMUSCULAR | Status: AC
  Filled 2016-09-22: qty 5

## 2016-09-22 MED ORDER — HEPARIN (PORCINE) IN NACL 2-0.9 UNIT/ML-% IJ SOLN
INTRAMUSCULAR | Status: AC
  Filled 2016-09-22: qty 1000

## 2016-09-22 MED ORDER — IOPAMIDOL (ISOVUE-300) INJECTION 61%
INTRAVENOUS | Status: DC | PRN
  Administered 2016-09-22: 5 mL via INTRA_ARTERIAL

## 2016-09-22 MED ORDER — FENTANYL CITRATE (PF) 100 MCG/2ML IJ SOLN
INTRAMUSCULAR | Status: AC
  Filled 2016-09-22: qty 2

## 2016-09-22 MED ORDER — DIPHENHYDRAMINE HCL 50 MG/ML IJ SOLN
INTRAMUSCULAR | Status: AC
  Filled 2016-09-22: qty 1

## 2016-09-22 MED ORDER — ONDANSETRON HCL 4 MG/2ML IJ SOLN: 4.0000 mg | Freq: Four times a day (QID) | INTRAMUSCULAR | Status: DC | PRN

## 2016-09-22 MED ORDER — FENTANYL CITRATE (PF) 100 MCG/2ML IJ SOLN
INTRAMUSCULAR | Status: DC | PRN
  Administered 2016-09-22 (×2): 50 ug via INTRAVENOUS

## 2016-09-22 MED ORDER — FAMOTIDINE 20 MG PO TABS: 40.0000 mg | ORAL_TABLET | ORAL | Status: DC | PRN

## 2016-09-22 MED ORDER — DEXTROSE 5 % IV SOLN
1.5000 g | INTRAVENOUS | Status: AC
  Administered 2016-09-22: 1.5 g via INTRAVENOUS

## 2016-09-22 MED ORDER — HEPARIN SODIUM (PORCINE) 1000 UNIT/ML IJ SOLN
INTRAMUSCULAR | Status: AC
  Filled 2016-09-22: qty 1

## 2016-09-22 MED ORDER — SODIUM CHLORIDE 0.9 % IV SOLN: INTRAVENOUS | Status: DC

## 2016-09-22 MED ORDER — MIDAZOLAM HCL 2 MG/2ML IJ SOLN
INTRAMUSCULAR | Status: DC | PRN
  Administered 2016-09-22: 2 mg via INTRAVENOUS
  Administered 2016-09-22 (×3): 1 mg via INTRAVENOUS

## 2016-09-22 SURGICAL SUPPLY — 11 items
CATH TORCON 5FR 0.38 (CATHETERS) ×3 IMPLANT
DEVICE TORQUE (MISCELLANEOUS) ×3 IMPLANT
DRAPE BRACHIAL (DRAPES) ×3 IMPLANT
GUIDEWIRE ANGLED .035 180CM (WIRE) ×3 IMPLANT
PACK ANGIOGRAPHY (CUSTOM PROCEDURE TRAY) ×3 IMPLANT
SET INTRO CAPELLA COAXIAL (SET/KITS/TRAYS/PACK) ×3 IMPLANT
SHEATH BRITE TIP 6FRX5.5 (SHEATH) ×3 IMPLANT
SHEATH BRITE TIP 7FRX5.5 (SHEATH) ×3 IMPLANT
TOWEL OR 17X26 4PK STRL BLUE (TOWEL DISPOSABLE) ×3 IMPLANT
WIRE G 018X200 V18 (WIRE) ×3 IMPLANT
WIRE MAGIC TOR.035 180C (WIRE) ×3 IMPLANT

## 2016-09-22 NOTE — Progress Notes (Signed)
Pt here today for fistulogram with possible intervention, vitals stable, alert and oriented, will cont. To monitor vitals along with etco2 during and throughout entire procedure.

## 2016-09-22 NOTE — Op Note (Signed)
OPERATIVE NOTE   PROCEDURE: 1. Contrast injection left arm AV fistula  PRE-OPERATIVE DIAGNOSIS: Complication of dialysis access                                                       End Stage Renal Disease  POST-OPERATIVE DIAGNOSIS: same as above   SURGEON: Katha Cabal, M.D.  ANESTHESIA: Conscious sedation was administered under my direct supervision. IV Versed plus fentanyl were utilized. Continuous ECG, pulse oximetry and blood pressure was monitored throughout the entire procedure.  Conscious sedation was for a total of 60 minutes.  ESTIMATED BLOOD LOSS: minimal  FINDING(S): 1. Large pseudoaneurysm near the arterial anastomosis  SPECIMEN(S):  None  CONTRAST: 5 cc  FLUOROSCOPY TIME: 1.3 minutes  INDICATIONS: Kendra [name] is a 65 y.o. female who  presents with malfunctioning left brachiocephalic AV access.  The patient is scheduled for angiography with possible intervention of the AV access.  The patient is aware the risks include but are not limited to: bleeding, infection, thrombosis of the cannulated access, and possible anaphylactic reaction to the contrast.  The patient acknowledges if the access can not be salvaged a tunneled catheter will be needed and will be placed during this procedure.  The patient is aware of the risks of the procedure and elects to proceed with the angiogram and intervention.  DESCRIPTION: After full informed written consent was obtained, the patient was brought back to the Special Procedure suite and placed supine position.  Appropriate cardiopulmonary monitors were placed.  The left arm was prepped and draped in the standard fashion.  Appropriate timeout is called. Ultrasound was placed in a sterile sleeve. Ultrasound demonstrated the fistula be echolucent and patent indicating patency. Images recorded for the permanent record. The micropuncture needle puncture is made under direct ultrasound visualization. The brachialcephalic fistula  was  cannulated with a micropuncture needle in a retrograde direction.  The microwire was advanced and the needle was exchanged for  a microsheath.  The J-wire was then advanced and a 6 Fr sheath inserted.  Hand injections were completed to image the access from the arterial anastomosis through the entire access.  The central venous structures were also imaged by hand injections.  Based on the images,  an 8 x 25 Viabahn is required however this is unavailable at this time.    A 4-0 Monocryl purse-string suture was sewn around the sheath.  The sheath was removed and light pressure was applied.  A sterile bandage was applied to the puncture site.    COMPLICATIONS: None   CONDITION: Unchanged  Katha Cabal, M.D [facility] Office: [phone]  09/22/2016 10:59 AM

## 2016-09-22 NOTE — Progress Notes (Signed)
MD in to see patient. No follow-up at this time as patient is to return after stent arrives

## 2016-09-22 NOTE — H&P (Signed)
[facility] History & Physical Update  The patient was interviewed and re-examined.  The patient's previous History and Physical has been reviewed and is unchanged.  There is no change in the plan of care. We plan to proceed with the scheduled procedure.  Hortencia Pilar, MD  09/22/2016, 8:00 AM

## 2016-09-23 ENCOUNTER — Encounter: Payer: Self-pay | Admitting: Vascular Surgery

## 2016-10-01 IMAGING — CR DG FOOT COMPLETE 3+V*R*
1 series · 3 of 3 positions shown · non-contrast
Comparison: None.

CLINICAL DATA: Right great toe pain and swelling for 1 week, no
known injury, diabetes

EXAM:
RIGHT FOOT COMPLETE - 3+ VIEW

[Series 1: dg foot complete right · 0.14mm/px · 3 of 3 slices shown]
[im 1/3]
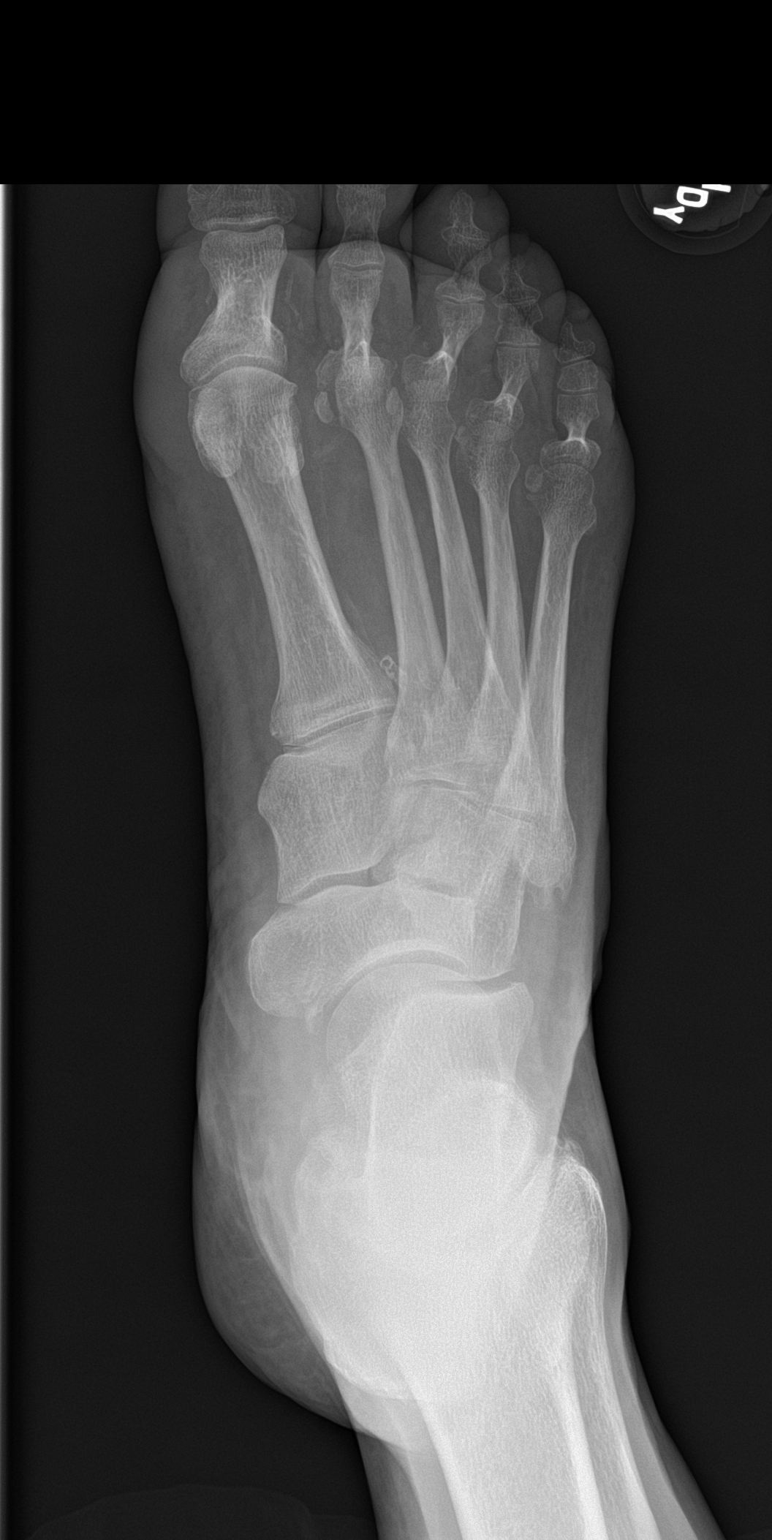
[im 2/3]
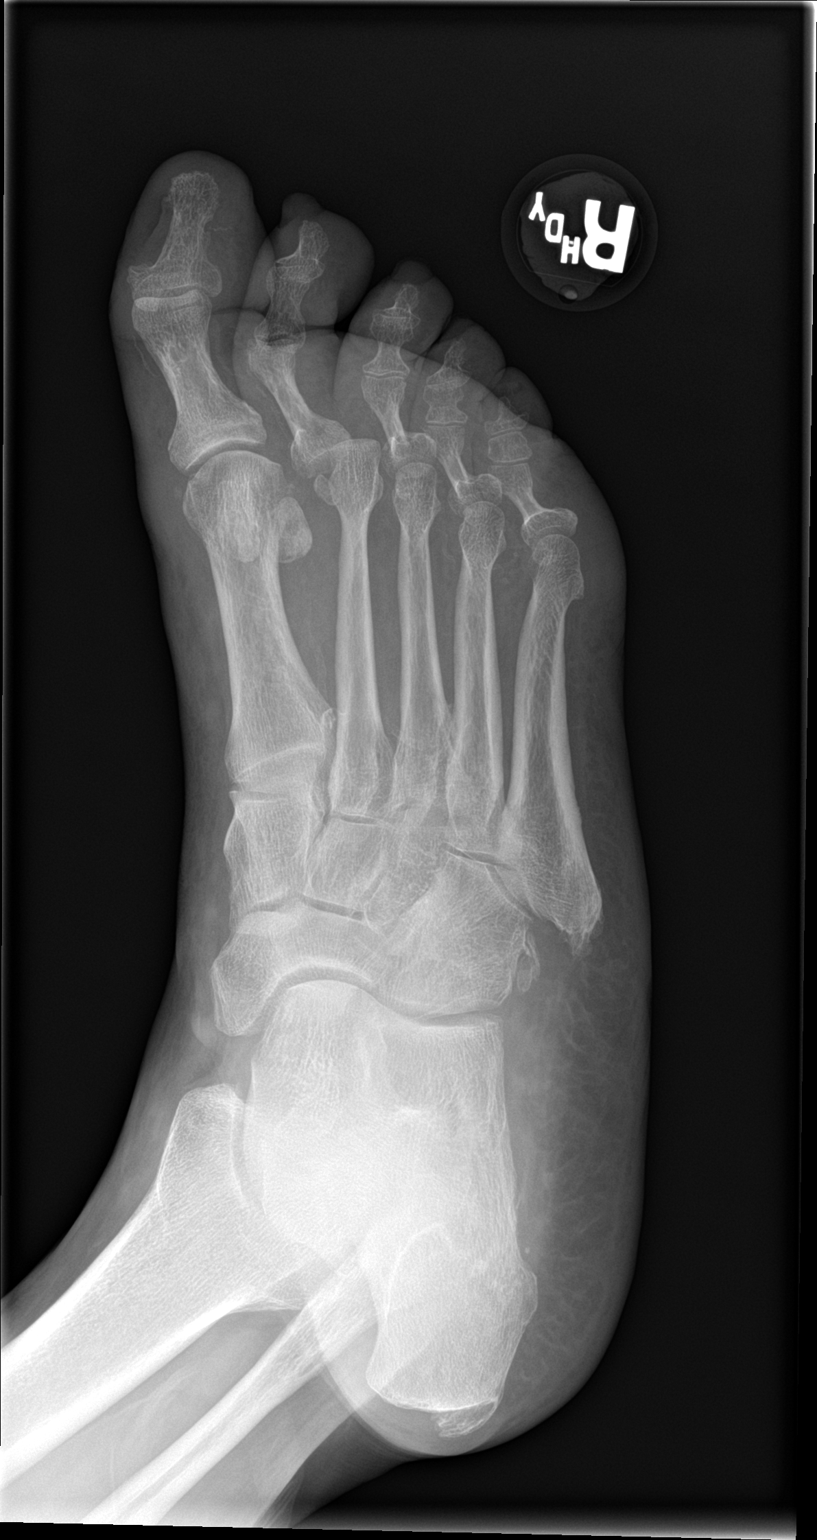
[im 3/3]
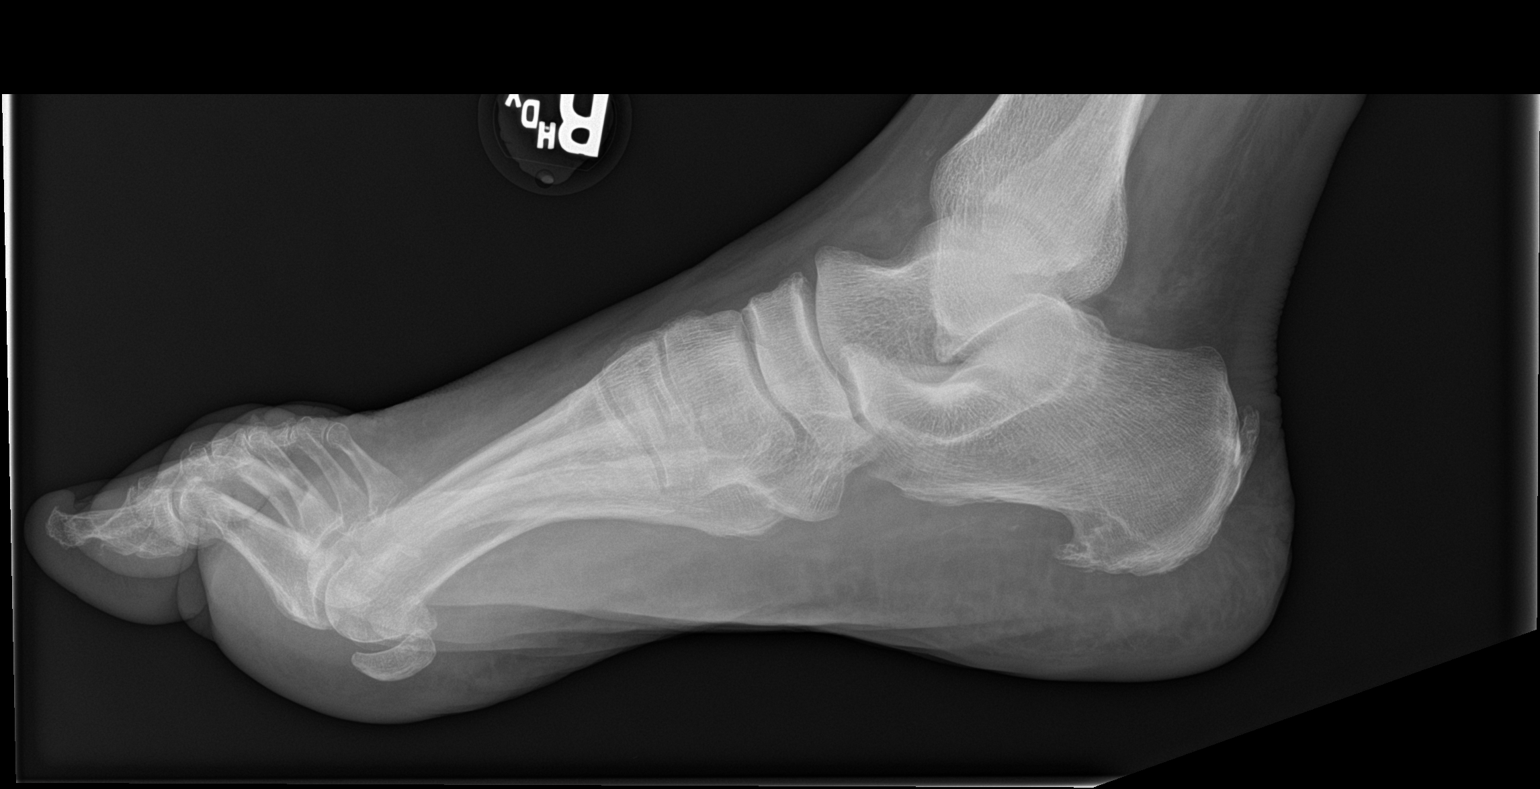

[3 of 3 positions shown; findings below may reference images not displayed]

FINDINGS: Three views of the right foot submitted. No acute fracture or
subluxation. There is soft tissue swelling great toe.
Atherosclerotic vascular calcifications are noted. No periosteal
reaction or bony erosion. Mild degenerative changes are noted first
metatarsal phalangeal joint. Plantar spur of calcaneus. Small
posterior spur of calcaneus.
IMPRESSION: No acute fracture or subluxation. Atherosclerotic vascular
calcifications are noted. Plantar and posterior spurring of
calcaneus. Mild degenerative changes first metatarsal phalangeal
joint.

## 2017-04-18 DIAGNOSIS — B029 Zoster without complications: Secondary | ICD-10-CM

## 2017-04-18 HISTORY — DX: Zoster without complications: B02.9

## 2017-05-02 ENCOUNTER — Emergency Department
Admission: EM | Admit: 2017-05-02 | Discharge: 2017-05-02 | Disposition: A | Discharge status: Home / Self Care | Payer: Medicare Other | Attending: Emergency Medicine | Admitting: Emergency Medicine

## 2017-05-02 ENCOUNTER — Emergency Department: Payer: Medicare Other

## 2017-05-02 DIAGNOSIS — E1122 Type 2 diabetes mellitus with diabetic chronic kidney disease: Secondary | ICD-10-CM | POA: Diagnosis not present

## 2017-05-02 DIAGNOSIS — E039 Hypothyroidism, unspecified: Secondary | ICD-10-CM | POA: Insufficient documentation

## 2017-05-02 DIAGNOSIS — R059 Cough, unspecified: Secondary | ICD-10-CM

## 2017-05-02 DIAGNOSIS — N390 Urinary tract infection, site not specified: Secondary | ICD-10-CM | POA: Insufficient documentation

## 2017-05-02 DIAGNOSIS — R0782 Intercostal pain: Secondary | ICD-10-CM | POA: Insufficient documentation

## 2017-05-02 DIAGNOSIS — N186 End stage renal disease: Secondary | ICD-10-CM | POA: Insufficient documentation

## 2017-05-02 DIAGNOSIS — I120 Hypertensive chronic kidney disease with stage 5 chronic kidney disease or end stage renal disease: Secondary | ICD-10-CM | POA: Diagnosis not present

## 2017-05-02 DIAGNOSIS — Z794 Long term (current) use of insulin: Secondary | ICD-10-CM | POA: Insufficient documentation

## 2017-05-02 DIAGNOSIS — L304 Erythema intertrigo: Secondary | ICD-10-CM | POA: Diagnosis not present

## 2017-05-02 DIAGNOSIS — R0781 Pleurodynia: Secondary | ICD-10-CM

## 2017-05-02 DIAGNOSIS — Z992 Dependence on renal dialysis: Secondary | ICD-10-CM | POA: Diagnosis not present

## 2017-05-02 DIAGNOSIS — R05 Cough: Secondary | ICD-10-CM

## 2017-05-02 DIAGNOSIS — R079 Chest pain, unspecified: Secondary | ICD-10-CM | POA: Diagnosis present

## 2017-05-02 LAB — URINALYSIS, COMPLETE (UACMP) WITH MICROSCOPIC
Bilirubin Urine: NEGATIVE
GLUCOSE, UA: 50 mg/dL — AB
KETONES UR: NEGATIVE mg/dL
Nitrite: NEGATIVE
PROTEIN: 100 mg/dL — AB
Specific Gravity, Urine: 1.013 (ref 1.005–1.030)
pH: 5 (ref 5.0–8.0)

## 2017-05-02 LAB — BASIC METABOLIC PANEL
Anion gap: 10 (ref 5–15)
BUN: 62 mg/dL — ABNORMAL HIGH (ref 6–20)
CHLORIDE: 99 mmol/L — AB (ref 101–111)
CO2: 29 mmol/L (ref 22–32)
CREATININE: 9.09 mg/dL — AB (ref 0.44–1.00)
Calcium: 9.2 mg/dL (ref 8.9–10.3)
GFR calc non Af Amer: 4 mL/min — ABNORMAL LOW (ref >60)
GFR, EST AFRICAN AMERICAN: 5 mL/min — AB (ref >60)
Glucose, Bld: 129 mg/dL — ABNORMAL HIGH (ref 65–99)
POTASSIUM: 4.8 mmol/L (ref 3.5–5.1)
SODIUM: 138 mmol/L (ref 135–145)

## 2017-05-02 LAB — CBC
HCT: 35.3 % (ref 35.0–47.0)
HEMOGLOBIN: 12.2 g/dL (ref 12.0–16.0)
MCH: 34.5 pg — AB (ref 26.0–34.0)
MCHC: 34.6 g/dL (ref 32.0–36.0)
MCV: 99.8 fL (ref 80.0–100.0)
PLATELETS: 250 10*3/uL (ref 150–440)
RBC: 3.54 MIL/uL — ABNORMAL LOW (ref 3.80–5.20)
RDW: 13.3 % (ref 11.5–14.5)
WBC: 6.3 10*3/uL (ref 3.6–11.0)

## 2017-05-02 MED ORDER — HYDROCOD POLST-CPM POLST ER 10-8 MG/5ML PO SUER: 5.0000 mL | Freq: Two times a day (BID) | ORAL | 0 refills | Status: DC

## 2017-05-02 MED ORDER — NYSTATIN 100000 UNIT/GM EX POWD
Freq: Once | CUTANEOUS | Status: AC
  Administered 2017-05-02: 06:00:00 via TOPICAL
  Filled 2017-05-02: qty 15

## 2017-05-02 MED ORDER — CEPHALEXIN 500 MG PO CAPS
500.0000 mg | ORAL_CAPSULE | Freq: Once | ORAL | Status: AC
  Administered 2017-05-02: 500 mg via ORAL
  Filled 2017-05-02: qty 1

## 2017-05-02 MED ORDER — HYDROCOD POLST-CPM POLST ER 10-8 MG/5ML PO SUER
5.0000 mL | Freq: Once | ORAL | Status: AC
  Administered 2017-05-02: 5 mL via ORAL
  Filled 2017-05-02: qty 5

## 2017-05-02 MED ORDER — CEPHALEXIN 500 MG PO CAPS: 500.0000 mg | ORAL_CAPSULE | Freq: Three times a day (TID) | ORAL | 0 refills | Status: DC

## 2017-05-02 NOTE — ED Triage Notes (Signed)
Patient reports she missed dialysis Wednesday due to malaise. Pt went to dialysis Thursday to make up. Pt on M/W/F schedule. Pt went to dialysis friday

## 2017-05-02 NOTE — ED Provider Notes (Signed)
[facility] Emergency Department Provider Note   ____________________________________________   First MD Initiated Contact with Patient 05/02/17 [phone]     (approximate)  I have reviewed the triage vital signs and the nursing notes.   HISTORY  Chief Complaint Chest Pain    HPI Kendra [name] is a 66 y.o. female who presents to the ED from home with a chief complaint of cough, sneezing, left breast and back pain. Patient reports nonproductive cough and sneezing since Monday. She has end-stage renal disease on dialysis Monday/Wednesday/Friday. She missed dialysis on Wednesday secondary to feeling bad and generalized malaise. When Thursday to make up her dialysis and also one Friday per her usual schedule. Complains of nonproductive cough, left rib pain, pain under her left breast and left low back pain. Patient still makes urine. Denies associated fever, chills, abdominal pain, nausea, vomiting. Denies recent travel or trauma. Nothing makes her symptoms better, cough makes her symptoms worse.   Past Medical History:  Diagnosis Date  . Anemia of chronic renal failure   . Diabetic neuropathy ([place])   . Diabetic retinopathy ([place])   . GERD (gastroesophageal reflux disease)   . Hyperparathyroidism, secondary renal ([place])   . Hypertension   . Hypothyroidism   . Insulin dependent diabetes mellitus ([place])   . Osteoarthritis    bilateral shoulders  . Primary hypertension     Patient Active Problem List   Diagnosis Date Noted  . Complication from renal dialysis device 09/21/2016  . ESRD on dialysis ([place]) 09/21/2016  . BP (high blood pressure) 07/15/2015  . Diabetes mellitus, type 2 ([place]) 07/15/2015  . Absolute anemia 07/15/2015    Past Surgical History:  Procedure Laterality Date  . ABDOMINAL HYSTERECTOMY    . BASCILIC VEIN TRANSPOSITION Left 03/04/2016   Procedure: BASCILIC VEIN TRANSPOSITION ( STAGE 1 );  Surgeon: Algernon Huxley, MD;  Location: [facility] ORS;   Service: Vascular;  Laterality: Left;  . CHOLECYSTECTOMY    . PERIPHERAL VASCULAR CATHETERIZATION N/A 01/22/2016   Procedure: Dialysis/Perma Catheter Insertion;  Surgeon: Katha Cabal, MD;  Location: [place] [facility];  Service: Cardiovascular;  Laterality: N/A;  . PERIPHERAL VASCULAR CATHETERIZATION Left 05/14/2016   Procedure: A/V Shuntogram/Fistulagram;  Surgeon: Algernon Huxley, MD;  Location: [place] [facility];  Service: Cardiovascular;  Laterality: Left;  . PERIPHERAL VASCULAR CATHETERIZATION N/A 05/14/2016   Procedure: A/V Shunt Intervention;  Surgeon: Algernon Huxley, MD;  Location: [place] [facility];  Service: Cardiovascular;  Laterality: N/A;  . PERIPHERAL VASCULAR CATHETERIZATION N/A 06/25/2016   Procedure: Dialysis/Perma Catheter Removal;  Surgeon: Algernon Huxley, MD;  Location: [place] [facility];  Service: Cardiovascular;  Laterality: N/A;  . PERIPHERAL VASCULAR CATHETERIZATION Left 08/03/2016   Procedure: A/V Shuntogram/Fistulagram;  Surgeon: Algernon Huxley, MD;  Location: [place] [facility];  Service: Cardiovascular;  Laterality: Left;  . PERIPHERAL VASCULAR CATHETERIZATION N/A 08/03/2016   Procedure: A/V Shunt Intervention;  Surgeon: Algernon Huxley, MD;  Location: [place] [facility];  Service: Cardiovascular;  Laterality: N/A;  . PERIPHERAL VASCULAR CATHETERIZATION Left 08/07/2016   Procedure: A/V Shuntogram/Fistulagram;  Surgeon: Katha Cabal, MD;  Location: [place] [facility];  Service: Cardiovascular;  Laterality: Left;  . PERIPHERAL VASCULAR CATHETERIZATION Left 09/22/2016   Procedure: A/V Fistulagram;  Surgeon: Katha Cabal, MD;  Location: [place] [facility];  Service: Cardiovascular;  Laterality: Left;  . THYROIDECTOMY      Prior to Admission medications   Medication Sig Start Date  End Date Taking? Authorizing Provider  acetaminophen (TYLENOL) 500 MG tablet Take 500 mg by mouth every 6 (six) hours as needed for mild pain.     [provider]    atorvastatin (LIPITOR) 40 MG tablet Take 30 mg by mouth daily. 09/05/16   [provider]  Calcium Acetate 668 (169 Ca) MG TABS Take 1 capsule by mouth 3 (three) times daily with meals.    [provider]  cephALEXin (KEFLEX) 500 MG capsule Take 1 capsule (500 mg total) by mouth 3 (three) times daily. 05/02/17   Paulette Blanch, MD  chlorpheniramine-HYDROcodone (TUSSIONEX PENNKINETIC ER) 10-8 MG/5ML SUER Take 5 mLs by mouth 2 (two) times daily. 05/02/17   Paulette Blanch, MD  clopidogrel (PLAVIX) 75 MG tablet Take 75 mg by mouth daily. 09/08/16   [provider]  furosemide (LASIX) 40 MG tablet Take 80 mg by mouth daily as needed for fluid.     [provider]  insulin aspart (NOVOLOG FLEXPEN) 100 UNIT/ML FlexPen Inject 8-12 Units into the skin 3 (three) times daily.  09/06/14   [provider]  insulin glargine (LANTUS) 100 UNIT/ML injection Inject 30 Units into the skin at bedtime. 25- 30 units at bedtime depending on glucose serum levels 10/15/14   [provider]  Insulin Pen Needle (PEN NEEDLES 31GX5/16") 31G X 8 MM MISC Use for insulin injection TID 07/31/14   [provider]  levothyroxine (SYNTHROID, LEVOTHROID) 150 MCG tablet Take 150 mcg by mouth daily before breakfast.    [provider]  promethazine (PHENERGAN) 25 MG tablet Take 25 mg by mouth every 6 (six) hours as needed for nausea or vomiting.    [provider]  sennosides-docusate sodium (SENOKOT-S) 8.6-50 MG tablet Take 1 tablet by mouth daily as needed for constipation. 1-2 tabs    [provider]  traMADol (ULTRAM) 50 MG tablet Take 50 mg by mouth every 6 (six) hours as needed for moderate pain.     [provider]    Allergies Patient has no known allergies.  Family History  Problem Relation Age of Onset  . Renal Disease Brother        with renal transplant  . Diabetes Mellitus II Father   . Diabetes Mellitus II Mother   .  Diabetes Mellitus II Brother     Social History Social History  Substance Use Topics  . Smoking status: Never Smoker  . Smokeless tobacco: Never Used  . Alcohol use No    Review of Systems  Constitutional: No fever/chills. Eyes: No visual changes. ENT: No sore throat. Cardiovascular: Positive for left rib and breast pain. Respiratory: Positive for nonproductive cough. Denies shortness of breath. Gastrointestinal: No abdominal pain.  No nausea, no vomiting.  No diarrhea.  No constipation. Genitourinary: Negative for dysuria. Musculoskeletal: Positive for back pain. Skin: Negative for rash. Neurological: Negative for headaches, focal weakness or numbness.   ____________________________________________   PHYSICAL EXAM:  VITAL SIGNS: ED Triage Vitals  Enc Vitals Group     BP 05/02/17 0400 (!) 147/76     Pulse Rate 05/02/17 0400 87     Resp 05/02/17 0400 16     Temp 05/02/17 0400 98.2 F (36.8 C)     Temp Source 05/02/17 0400 Oral     SpO2 05/02/17 0400 100 %     Weight 05/02/17 0400 230 lb (104.3 kg)     Height 05/02/17 0400 5\' 6"  (1.676 m)     Head Circumference --  Peak Flow --      Pain Score 05/02/17 0408 8     Pain Loc --      Pain Edu? --      Excl. in [place]? --     Constitutional: Alert and oriented. Well appearing and in no acute distress. Eyes: Conjunctivae are normal. PERRL. EOMI. Head: Atraumatic. Nose: No congestion/rhinnorhea. Mouth/Throat: Mucous membranes are moist.  Oropharynx non-erythematous. Neck: No stridor.   Cardiovascular: Normal rate, regular rhythm. Grossly normal heart sounds.  Good peripheral circulation. Respiratory: Normal respiratory effort.  No retractions. Lungs CTAB. Left lower lateral ribs tender to palpation. No splinting. No crepitus. Gastrointestinal: Soft and nontender. No distention. No abdominal bruits. No CVA tenderness. Musculoskeletal: No lower extremity tenderness nor edema.  No joint effusions. Neurologic:  Normal  speech and language. No gross focal neurologic deficits are appreciated.  Skin:  Skin is warm, dry and intact. Area beneath the left breast with mild skin breakdown, excoriated. No petechiae. Psychiatric: Mood and affect are normal. Speech and behavior are normal.  ____________________________________________   LABS (all labs ordered are listed, but only abnormal results are displayed)  Labs Reviewed  BASIC METABOLIC PANEL - Abnormal; Notable for the following:       Result Value   Chloride 99 (*)    Glucose, Bld 129 (*)    BUN 62 (*)    Creatinine, Ser 9.09 (*)    GFR calc non Af Amer 4 (*)    GFR calc Af Amer 5 (*)    All other components within normal limits  CBC - Abnormal; Notable for the following:    RBC 3.54 (*)    MCH 34.5 (*)    All other components within normal limits  URINALYSIS, COMPLETE (UACMP) WITH MICROSCOPIC - Abnormal; Notable for the following:    Color, Urine YELLOW (*)    APPearance CLOUDY (*)    Glucose, UA 50 (*)    Hgb urine dipstick MODERATE (*)    Protein, ur 100 (*)    Leukocytes, UA TRACE (*)    Bacteria, UA RARE (*)    Squamous Epithelial / LPF TOO NUMEROUS TO COUNT (*)    All other components within normal limits   ____________________________________________  EKG  ED ECG REPORT I, Issa Luster J, the attending physician, personally viewed and interpreted this ECG.   Date: 05/02/2017  EKG Time: 0416  Rate: 81  Rhythm: normal EKG, normal sinus rhythm  Axis: Normal  Intervals:none  ST&T Change: Nonspecific  ____________________________________________  RADIOLOGY  Dg Chest 2 View  Result Date: 05/02/2017 CLINICAL DATA:  66 year old female with shortness of breath and left breast pain radiating to the back. EXAM: CHEST  2 VIEW COMPARISON:  Chest radiograph dated 05/06/2016 FINDINGS: There is mild cardiomegaly. There is mild central vascular prominence with cephalization consistent with vascular congestion. No focal consolidation, pleural  effusion, or pneumothorax. There is osteopenia with degenerative changes of the spine. No acute osseous pathology. IMPRESSION: Cardiomegaly with mild vascular congestion.  No focal consolidation. Electronically Signed   By: Anner Crete M.D.   On: 05/02/2017 05:06    ____________________________________________   PROCEDURES  Procedure(s) performed: None  Procedures  Critical Care performed: No  ____________________________________________   INITIAL IMPRESSION / ASSESSMENT AND PLAN / ED COURSE  Pertinent labs & imaging results that were available during my care of the patient were reviewed by me and considered in my medical decision making (see chart for details).  66 year old female on dialysis who presents with nonproductive  cough, left-sided rib and low back pain. Will check screening lab work, urinalysis and chest x-ray. Nystatin powder ordered for intertriginous rash. Tussionex ordered for cough.  Clinical Course as of May 02 656  Sun May 02, 2017  0656 Updated patient of laboratory and urinalysis results. Will place on Keflex for UTI, Tussionex as needed for cough, nystatin powder for intertriginous rash and patient will follow-up closely with her PCP. Strict return precautions given. Patient verbalizes understanding and agrees with plan of care.  [JS]    Clinical Course User Index [JS] Paulette Blanch, MD     ____________________________________________   FINAL CLINICAL IMPRESSION(S) / ED DIAGNOSES  Final diagnoses:  Intertrigo  Rib pain on left side  Cough  Urinary tract infection without hematuria, site unspecified      NEW MEDICATIONS STARTED DURING THIS VISIT:  New Prescriptions   CEPHALEXIN (KEFLEX) 500 MG CAPSULE    Take 1 capsule (500 mg total) by mouth 3 (three) times daily.   CHLORPHENIRAMINE-HYDROCODONE (TUSSIONEX PENNKINETIC ER) 10-8 MG/5ML SUER    Take 5 mLs by mouth 2 (two) times daily.     Note:  This document was prepared using Dragon  voice recognition software and may include unintentional dictation errors.    Paulette Blanch, MD 05/02/17 [phone]

## 2017-05-02 NOTE — ED Triage Notes (Signed)
Patient c/o left breast pain radiating to back. Pt reports concurrent symptoms of cough, SOB, nausea.

## 2017-05-02 NOTE — Discharge Instructions (Signed)
1. Take antibiotic as prescribed (Keflex 500 mg 3 times daily 7 days). 2. Take Tussionex as needed for cough. 3. Apply nystatin powder beneath left breast 3 times daily as needed. 4. Return to the ER for worsening symptoms, persistent vomiting, difficulty breathing, fever or other concerns.

## 2017-05-03 DIAGNOSIS — K5900 Constipation, unspecified: Secondary | ICD-10-CM | POA: Insufficient documentation

## 2017-05-03 DIAGNOSIS — H532 Diplopia: Secondary | ICD-10-CM | POA: Diagnosis not present

## 2017-05-03 DIAGNOSIS — B029 Zoster without complications: Secondary | ICD-10-CM | POA: Diagnosis not present

## 2017-05-03 DIAGNOSIS — N2581 Secondary hyperparathyroidism of renal origin: Secondary | ICD-10-CM | POA: Insufficient documentation

## 2017-05-03 DIAGNOSIS — I120 Hypertensive chronic kidney disease with stage 5 chronic kidney disease or end stage renal disease: Secondary | ICD-10-CM | POA: Insufficient documentation

## 2017-05-03 DIAGNOSIS — I2510 Atherosclerotic heart disease of native coronary artery without angina pectoris: Secondary | ICD-10-CM | POA: Insufficient documentation

## 2017-05-03 DIAGNOSIS — N12 Tubulo-interstitial nephritis, not specified as acute or chronic: Secondary | ICD-10-CM | POA: Diagnosis not present

## 2017-05-03 DIAGNOSIS — E1140 Type 2 diabetes mellitus with diabetic neuropathy, unspecified: Secondary | ICD-10-CM | POA: Insufficient documentation

## 2017-05-03 DIAGNOSIS — D631 Anemia in chronic kidney disease: Secondary | ICD-10-CM | POA: Diagnosis not present

## 2017-05-03 DIAGNOSIS — R112 Nausea with vomiting, unspecified: Secondary | ICD-10-CM | POA: Insufficient documentation

## 2017-05-03 DIAGNOSIS — Z992 Dependence on renal dialysis: Secondary | ICD-10-CM | POA: Insufficient documentation

## 2017-05-03 DIAGNOSIS — E1122 Type 2 diabetes mellitus with diabetic chronic kidney disease: Secondary | ICD-10-CM | POA: Insufficient documentation

## 2017-05-03 DIAGNOSIS — Z7902 Long term (current) use of antithrombotics/antiplatelets: Secondary | ICD-10-CM | POA: Diagnosis not present

## 2017-05-03 DIAGNOSIS — R531 Weakness: Secondary | ICD-10-CM | POA: Insufficient documentation

## 2017-05-03 DIAGNOSIS — G9340 Encephalopathy, unspecified: Secondary | ICD-10-CM | POA: Diagnosis not present

## 2017-05-03 DIAGNOSIS — Z79899 Other long term (current) drug therapy: Secondary | ICD-10-CM | POA: Diagnosis not present

## 2017-05-03 DIAGNOSIS — E11319 Type 2 diabetes mellitus with unspecified diabetic retinopathy without macular edema: Secondary | ICD-10-CM | POA: Insufficient documentation

## 2017-05-03 DIAGNOSIS — E890 Postprocedural hypothyroidism: Secondary | ICD-10-CM | POA: Diagnosis not present

## 2017-05-03 DIAGNOSIS — N186 End stage renal disease: Secondary | ICD-10-CM | POA: Insufficient documentation

## 2017-05-03 DIAGNOSIS — Z794 Long term (current) use of insulin: Secondary | ICD-10-CM | POA: Diagnosis not present

## 2017-05-03 NOTE — ED Triage Notes (Addendum)
Pt presents to ED via POV with c/o LEFT flank pain. Pt reports being seen here yesterday and told she had a UTI/kidney infection. Pt was given r/x for ABX and took last dose at 830pm tonight. Pt reports she completed dialysis today as scheduled and is here for pain control. Pt is A&O, in NAD, with RR even, regular, and unlabored.

## 2017-05-03 NOTE — ED Notes (Signed)
Spoke with Dr Joni Fears reagrding pt's c/o's and presenting s/x's. VORB to add-on urine culture to previously collected UA. Unable to add PO Pyridium verbal order d/t contraindication flag that was raised when attempting to enter order.

## 2017-05-04 ENCOUNTER — Observation Stay
Admission: EM | Admit: 2017-05-04 | Discharge: 2017-05-08 | Disposition: A | Discharge status: Home / Self Care | Payer: Medicare Other | Attending: Internal Medicine | Admitting: Internal Medicine

## 2017-05-04 ENCOUNTER — Encounter (INDEPENDENT_AMBULATORY_CARE_PROVIDER_SITE_OTHER): Payer: Self-pay

## 2017-05-04 ENCOUNTER — Emergency Department: Payer: Medicare Other

## 2017-05-04 DIAGNOSIS — R109 Unspecified abdominal pain: Secondary | ICD-10-CM

## 2017-05-04 DIAGNOSIS — B029 Zoster without complications: Secondary | ICD-10-CM

## 2017-05-04 DIAGNOSIS — H532 Diplopia: Secondary | ICD-10-CM

## 2017-05-04 DIAGNOSIS — I639 Cerebral infarction, unspecified: Secondary | ICD-10-CM

## 2017-05-04 DIAGNOSIS — N390 Urinary tract infection, site not specified: Secondary | ICD-10-CM

## 2017-05-04 DIAGNOSIS — R079 Chest pain, unspecified: Secondary | ICD-10-CM

## 2017-05-04 DIAGNOSIS — R112 Nausea with vomiting, unspecified: Secondary | ICD-10-CM

## 2017-05-04 DIAGNOSIS — N12 Tubulo-interstitial nephritis, not specified as acute or chronic: Secondary | ICD-10-CM | POA: Diagnosis present

## 2017-05-04 LAB — URINALYSIS, COMPLETE (UACMP) WITH MICROSCOPIC
Bilirubin Urine: NEGATIVE
Glucose, UA: 150 mg/dL — AB
Ketones, ur: NEGATIVE mg/dL
Nitrite: NEGATIVE
Protein, ur: 100 mg/dL — AB
Specific Gravity, Urine: 1.009 (ref 1.005–1.030)
pH: 7 (ref 5.0–8.0)

## 2017-05-04 LAB — LACTIC ACID, PLASMA: Lactic Acid, Venous: 1.3 mmol/L (ref 0.5–1.9)

## 2017-05-04 LAB — COMPREHENSIVE METABOLIC PANEL
ALBUMIN: 4.1 g/dL (ref 3.5–5.0)
ALK PHOS: 49 U/L (ref 38–126)
ALT: 25 U/L (ref 14–54)
AST: 40 U/L (ref 15–41)
Anion gap: 11 (ref 5–15)
BUN: 32 mg/dL — ABNORMAL HIGH (ref 6–20)
CALCIUM: 8.7 mg/dL — AB (ref 8.9–10.3)
CHLORIDE: 92 mmol/L — AB (ref 101–111)
CO2: 30 mmol/L (ref 22–32)
CREATININE: 6.47 mg/dL — AB (ref 0.44–1.00)
GFR calc Af Amer: 7 mL/min — ABNORMAL LOW (ref >60)
GFR calc non Af Amer: 6 mL/min — ABNORMAL LOW (ref >60)
GLUCOSE: 198 mg/dL — AB (ref 65–99)
Potassium: 4.5 mmol/L (ref 3.5–5.1)
SODIUM: 133 mmol/L — AB (ref 135–145)
Total Bilirubin: 1 mg/dL (ref 0.3–1.2)
Total Protein: 8 g/dL (ref 6.5–8.1)

## 2017-05-04 LAB — CBC
HCT: 35.7 % (ref 35.0–47.0)
Hemoglobin: 12.1 g/dL (ref 12.0–16.0)
MCH: 33.1 pg (ref 26.0–34.0)
MCHC: 34 g/dL (ref 32.0–36.0)
MCV: 97.5 fL (ref 80.0–100.0)
PLATELETS: 265 10*3/uL (ref 150–440)
RBC: 3.66 MIL/uL — ABNORMAL LOW (ref 3.80–5.20)
RDW: 13.1 % (ref 11.5–14.5)
WBC: 4.9 10*3/uL (ref 3.6–11.0)

## 2017-05-04 LAB — MRSA PCR SCREENING: MRSA by PCR: NEGATIVE

## 2017-05-04 LAB — TSH: TSH: 10.48 u[IU]/mL — ABNORMAL HIGH (ref 0.350–4.500)

## 2017-05-04 LAB — GLUCOSE, CAPILLARY
Glucose-Capillary: 202 mg/dL — ABNORMAL HIGH (ref 65–99)
Glucose-Capillary: 179 mg/dL — ABNORMAL HIGH (ref 65–99)
Glucose-Capillary: 111 mg/dL — ABNORMAL HIGH (ref 65–99)
Glucose-Capillary: 194 mg/dL — ABNORMAL HIGH (ref 65–99)

## 2017-05-04 LAB — TROPONIN I

## 2017-05-04 LAB — MAGNESIUM: Magnesium: 2.2 mg/dL (ref 1.7–2.4)

## 2017-05-04 LAB — T4, FREE: Free T4: 0.97 ng/dL (ref 0.61–1.12)

## 2017-05-04 MED ORDER — CLOPIDOGREL BISULFATE 75 MG PO TABS
75.0000 mg | ORAL_TABLET | Freq: Every day | ORAL | Status: DC
  Administered 2017-05-04 – 2017-05-08 (×5): 75 mg via ORAL
  Filled 2017-05-04 (×5): qty 1

## 2017-05-04 MED ORDER — OXYCODONE-ACETAMINOPHEN 5-325 MG PO TABS
1.0000 | ORAL_TABLET | Freq: Once | ORAL | Status: AC
  Administered 2017-05-04: 1 via ORAL
  Filled 2017-05-04: qty 1

## 2017-05-04 MED ORDER — CIPROFLOXACIN IN D5W 400 MG/200ML IV SOLN
400.0000 mg | Freq: Once | INTRAVENOUS | Status: AC
  Administered 2017-05-04: 400 mg via INTRAVENOUS
  Filled 2017-05-04: qty 200

## 2017-05-04 MED ORDER — HYDROCOD POLST-CPM POLST ER 10-8 MG/5ML PO SUER: 5.0000 mL | Freq: Two times a day (BID) | ORAL | Status: DC | PRN

## 2017-05-04 MED ORDER — CAPSAICIN 0.025 % EX CREA
TOPICAL_CREAM | Freq: Two times a day (BID) | CUTANEOUS | Status: DC
  Administered 2017-05-04 – 2017-05-06 (×5): via TOPICAL
  Filled 2017-05-04: qty 60

## 2017-05-04 MED ORDER — INSULIN GLARGINE 100 UNIT/ML ~~LOC~~ SOLN
16.0000 [IU] | Freq: Every day | SUBCUTANEOUS | Status: DC
  Administered 2017-05-04 – 2017-05-07 (×4): 16 [IU] via SUBCUTANEOUS
  Filled 2017-05-04 (×5): qty 0.16

## 2017-05-04 MED ORDER — GABAPENTIN 600 MG PO TABS
300.0000 mg | ORAL_TABLET | Freq: Two times a day (BID) | ORAL | Status: DC
  Administered 2017-05-04 (×2): 300 mg via ORAL
  Filled 2017-05-04 (×2): qty 1

## 2017-05-04 MED ORDER — VALACYCLOVIR HCL 500 MG PO TABS
1000.0000 mg | ORAL_TABLET | ORAL | Status: DC
  Administered 2017-05-05 – 2017-05-07 (×3): 1000 mg via ORAL
  Filled 2017-05-04 (×4): qty 2

## 2017-05-04 MED ORDER — ONDANSETRON HCL 4 MG PO TABS: 4.0000 mg | ORAL_TABLET | Freq: Four times a day (QID) | ORAL | Status: DC | PRN

## 2017-05-04 MED ORDER — FUROSEMIDE 40 MG PO TABS: 80.0000 mg | ORAL_TABLET | Freq: Every day | ORAL | Status: DC | PRN

## 2017-05-04 MED ORDER — CIPROFLOXACIN IN D5W 400 MG/200ML IV SOLN: 400.0000 mg | INTRAVENOUS | Status: DC

## 2017-05-04 MED ORDER — CIPROFLOXACIN IN D5W 400 MG/200ML IV SOLN
400.0000 mg | INTRAVENOUS | Status: DC
  Filled 2017-05-04: qty 200

## 2017-05-04 MED ORDER — CALCIUM ACETATE 668 (169 CA) MG PO TABS: 2.0000 | ORAL_TABLET | Freq: Three times a day (TID) | ORAL | Status: DC

## 2017-05-04 MED ORDER — LEVOTHYROXINE SODIUM 50 MCG PO TABS
150.0000 ug | ORAL_TABLET | Freq: Every day | ORAL | Status: DC
  Administered 2017-05-04 – 2017-05-08 (×5): 150 ug via ORAL
  Filled 2017-05-04 (×5): qty 1

## 2017-05-04 MED ORDER — VALACYCLOVIR HCL 500 MG PO TABS
1000.0000 mg | ORAL_TABLET | Freq: Once | ORAL | Status: AC
  Administered 2017-05-04: 1000 mg via ORAL
  Filled 2017-05-04: qty 2

## 2017-05-04 MED ORDER — MORPHINE SULFATE (PF) 2 MG/ML IV SOLN
2.0000 mg | Freq: Four times a day (QID) | INTRAVENOUS | Status: DC | PRN
  Administered 2017-05-04: 2 mg via INTRAVENOUS
  Filled 2017-05-04: qty 1

## 2017-05-04 MED ORDER — DEXTROSE 5 % IV SOLN
800.0000 mg | Freq: Three times a day (TID) | INTRAVENOUS | Status: DC
  Filled 2017-05-04 (×3): qty 16

## 2017-05-04 MED ORDER — INSULIN ASPART 100 UNIT/ML ~~LOC~~ SOLN
0.0000 [IU] | Freq: Three times a day (TID) | SUBCUTANEOUS | Status: DC
  Administered 2017-05-04: 5 [IU] via SUBCUTANEOUS
  Administered 2017-05-04 – 2017-05-05 (×2): 3 [IU] via SUBCUTANEOUS
  Administered 2017-05-06 (×3): 2 [IU] via SUBCUTANEOUS
  Administered 2017-05-07: 3 [IU] via SUBCUTANEOUS
  Administered 2017-05-07: 5 [IU] via SUBCUTANEOUS
  Administered 2017-05-08: 2 [IU] via SUBCUTANEOUS
  Filled 2017-05-04 (×9): qty 1

## 2017-05-04 MED ORDER — SENNOSIDES-DOCUSATE SODIUM 8.6-50 MG PO TABS
1.0000 | ORAL_TABLET | Freq: Every day | ORAL | Status: DC | PRN
  Administered 2017-05-06: 1 via ORAL
  Filled 2017-05-04: qty 1

## 2017-05-04 MED ORDER — ACETAMINOPHEN 325 MG PO TABS
650.0000 mg | ORAL_TABLET | Freq: Four times a day (QID) | ORAL | Status: DC | PRN
  Administered 2017-05-07: 650 mg via ORAL
  Filled 2017-05-04: qty 2

## 2017-05-04 MED ORDER — HYDROCOD POLST-CPM POLST ER 10-8 MG/5ML PO SUER
5.0000 mL | Freq: Two times a day (BID) | ORAL | Status: DC
  Administered 2017-05-04: 5 mL via ORAL
  Filled 2017-05-04: qty 5

## 2017-05-04 MED ORDER — FAMOTIDINE IN NACL 20-0.9 MG/50ML-% IV SOLN
20.0000 mg | Freq: Once | INTRAVENOUS | Status: AC
  Administered 2017-05-04: 20 mg via INTRAVENOUS
  Filled 2017-05-04: qty 50

## 2017-05-04 MED ORDER — ATORVASTATIN CALCIUM 20 MG PO TABS
30.0000 mg | ORAL_TABLET | Freq: Every day | ORAL | Status: DC
  Administered 2017-05-04 – 2017-05-07 (×4): 30 mg via ORAL
  Filled 2017-05-04 (×4): qty 1

## 2017-05-04 MED ORDER — CALCIUM ACETATE (PHOS BINDER) 667 MG PO CAPS
1334.0000 mg | ORAL_CAPSULE | Freq: Three times a day (TID) | ORAL | Status: DC
  Administered 2017-05-04 – 2017-05-08 (×12): 1334 mg via ORAL
  Filled 2017-05-04 (×15): qty 2

## 2017-05-04 MED ORDER — DOCUSATE SODIUM 100 MG PO CAPS
100.0000 mg | ORAL_CAPSULE | Freq: Two times a day (BID) | ORAL | Status: DC
  Administered 2017-05-04 – 2017-05-08 (×8): 100 mg via ORAL
  Filled 2017-05-04 (×8): qty 1

## 2017-05-04 MED ORDER — HEPARIN SODIUM (PORCINE) 5000 UNIT/ML IJ SOLN
5000.0000 [IU] | Freq: Three times a day (TID) | INTRAMUSCULAR | Status: DC
  Administered 2017-05-04 – 2017-05-08 (×12): 5000 [IU] via SUBCUTANEOUS
  Filled 2017-05-04 (×12): qty 1

## 2017-05-04 MED ORDER — ACETAMINOPHEN 650 MG RE SUPP: 650.0000 mg | Freq: Four times a day (QID) | RECTAL | Status: DC | PRN

## 2017-05-04 MED ORDER — DEXTROSE 5 % IV SOLN
400.0000 mg | INTRAVENOUS | Status: DC
  Filled 2017-05-04: qty 8

## 2017-05-04 MED ORDER — ONDANSETRON HCL 4 MG/2ML IJ SOLN
4.0000 mg | Freq: Four times a day (QID) | INTRAMUSCULAR | Status: DC | PRN
  Administered 2017-05-04 – 2017-05-06 (×3): 4 mg via INTRAVENOUS
  Filled 2017-05-04 (×3): qty 2

## 2017-05-04 MED ORDER — OXYCODONE HCL 5 MG PO TABS
5.0000 mg | ORAL_TABLET | Freq: Four times a day (QID) | ORAL | Status: DC | PRN
  Administered 2017-05-04 – 2017-05-05 (×2): 5 mg via ORAL
  Filled 2017-05-04 (×2): qty 1

## 2017-05-04 NOTE — ED Provider Notes (Signed)
[facility] Emergency Department Provider Note   ____________________________________________   First MD Initiated Contact with Patient 05/04/17 0207     (approximate)  I have reviewed the triage vital signs and the nursing notes.   HISTORY  Chief Complaint Back Pain    HPI Kendra [name] is a 66 y.o. female who returns to the ED from home with a chief complaint of left flank pain, pain underneath her left breast and chest pain. Patient is a dialysis patient and received dialysis today per her usual schedule. She was seen in the [facility] 7/15 for similar; still urinates and was found to have UTI and placed on Keflex. Had evidence of intertriginous rash beneath left breast and was placed on nystatin. Reports no improvement in symptoms and is here for pain control. New symptom of left chest pain which she describes as heartburn. Denies associated diaphoresis, nausea/vomiting, palpitations or dizziness. Denies fever, chills, abdominal pain, diarrhea. Denies recent travel or trauma. Nothing makes her symptoms better or worse.   Past Medical History:  Diagnosis Date  . Anemia of chronic renal failure   . Diabetic neuropathy ([place])   . Diabetic retinopathy ([place])   . GERD (gastroesophageal reflux disease)   . Hyperparathyroidism, secondary renal ([place])   . Hypertension   . Hypothyroidism   . Insulin dependent diabetes mellitus ([place])   . Osteoarthritis    bilateral shoulders  . Primary hypertension     Patient Active Problem List   Diagnosis Date Noted  . Complication from renal dialysis device 09/21/2016  . ESRD on dialysis ([place]) 09/21/2016  . BP (high blood pressure) 07/15/2015  . Diabetes mellitus, type 2 ([place]) 07/15/2015  . Absolute anemia 07/15/2015    Past Surgical History:  Procedure Laterality Date  . ABDOMINAL HYSTERECTOMY    . BASCILIC VEIN TRANSPOSITION Left 03/04/2016   Procedure: BASCILIC VEIN TRANSPOSITION ( STAGE 1 );  Surgeon: Algernon Huxley, MD;  Location: [facility] ORS;  Service: Vascular;  Laterality: Left;  . CHOLECYSTECTOMY    . PERIPHERAL VASCULAR CATHETERIZATION N/A 01/22/2016   Procedure: Dialysis/Perma Catheter Insertion;  Surgeon: Katha Cabal, MD;  Location: [place] CV LAB;  Service: Cardiovascular;  Laterality: N/A;  . PERIPHERAL VASCULAR CATHETERIZATION Left 05/14/2016   Procedure: A/V Shuntogram/Fistulagram;  Surgeon: Algernon Huxley, MD;  Location: [place] CV LAB;  Service: Cardiovascular;  Laterality: Left;  . PERIPHERAL VASCULAR CATHETERIZATION N/A 05/14/2016   Procedure: A/V Shunt Intervention;  Surgeon: Algernon Huxley, MD;  Location: [place] CV LAB;  Service: Cardiovascular;  Laterality: N/A;  . PERIPHERAL VASCULAR CATHETERIZATION N/A 06/25/2016   Procedure: Dialysis/Perma Catheter Removal;  Surgeon: Algernon Huxley, MD;  Location: [place] CV LAB;  Service: Cardiovascular;  Laterality: N/A;  . PERIPHERAL VASCULAR CATHETERIZATION Left 08/03/2016   Procedure: A/V Shuntogram/Fistulagram;  Surgeon: Algernon Huxley, MD;  Location: [place] CV LAB;  Service: Cardiovascular;  Laterality: Left;  . PERIPHERAL VASCULAR CATHETERIZATION N/A 08/03/2016   Procedure: A/V Shunt Intervention;  Surgeon: Algernon Huxley, MD;  Location: [place] CV LAB;  Service: Cardiovascular;  Laterality: N/A;  . PERIPHERAL VASCULAR CATHETERIZATION Left 08/07/2016   Procedure: A/V Shuntogram/Fistulagram;  Surgeon: Katha Cabal, MD;  Location: [place] CV LAB;  Service: Cardiovascular;  Laterality: Left;  . PERIPHERAL VASCULAR CATHETERIZATION Left 09/22/2016   Procedure: A/V Fistulagram;  Surgeon: Katha Cabal, MD;  Location: [place] CV LAB;  Service: Cardiovascular;  Laterality: Left;  . THYROIDECTOMY  Prior to Admission medications   Medication Sig Start Date End Date Taking? Authorizing Provider  cephALEXin (KEFLEX) 500 MG capsule Take 1 capsule (500 mg total) by mouth 3 (three) times daily. 05/02/17  Yes  Paulette Blanch, MD  chlorpheniramine-HYDROcodone [facility] PENNKINETIC ER) 10-8 MG/5ML SUER Take 5 mLs by mouth 2 (two) times daily. 05/02/17  Yes Paulette Blanch, MD  clopidogrel (PLAVIX) 75 MG tablet Take 75 mg by mouth daily. 09/08/16  Yes [provider]  insulin detemir (LEVEMIR) 100 UNIT/ML injection Inject 26 Units into the skin at bedtime.   Yes [provider]  insulin lispro (HUMALOG) 100 UNIT/ML injection Inject 12-25 Units into the skin 2 (two) times daily.   Yes [provider]  acetaminophen (TYLENOL) 500 MG tablet Take 500 mg by mouth every 6 (six) hours as needed for mild pain.     [provider]  atorvastatin (LIPITOR) 40 MG tablet Take 30 mg by mouth daily. 09/05/16   [provider]  Calcium Acetate 668 (169 Ca) MG TABS Take 1 capsule by mouth 3 (three) times daily with meals.    [provider]  furosemide (LASIX) 40 MG tablet Take 80 mg by mouth daily as needed for fluid.     [provider]  Insulin Pen Needle (PEN NEEDLES 31GX5/16") 31G X 8 MM MISC Use for insulin injection TID 07/31/14   [provider]  levothyroxine (SYNTHROID, LEVOTHROID) 150 MCG tablet Take 150 mcg by mouth daily before breakfast.    [provider]  promethazine (PHENERGAN) 25 MG tablet Take 25 mg by mouth every 6 (six) hours as needed for nausea or vomiting.    [provider]  sennosides-docusate sodium (SENOKOT-S) 8.6-50 MG tablet Take 1 tablet by mouth daily as needed for constipation. 1-2 tabs    [provider]  traMADol (ULTRAM) 50 MG tablet Take 50 mg by mouth every 6 (six) hours as needed for moderate pain.     [provider]    Allergies Patient has no known allergies.  Family History  Problem Relation Age of Onset  . Renal Disease Brother        with renal transplant  . Diabetes Mellitus II Father   . Diabetes Mellitus II Mother   . Diabetes Mellitus II Brother     Social  History Social History  Substance Use Topics  . Smoking status: Never Smoker  . Smokeless tobacco: Never Used  . Alcohol use No    Review of Systems  Constitutional: No fever/chills. Eyes: No visual changes. ENT: No sore throat. Cardiovascular: Positive for chest pain. Respiratory: Denies shortness of breath. Gastrointestinal: No abdominal pain.  No nausea, no vomiting.  No diarrhea.  No constipation. Genitourinary: Negative for dysuria. Musculoskeletal: Positive for back pain. Skin: Positive for rash. Neurological: Negative for headaches, focal weakness or numbness.   ____________________________________________   PHYSICAL EXAM:  VITAL SIGNS: ED Triage Vitals   Enc Vitals Group     BP (!) 158/78     Pulse Rate 93     Resp      Temp 99.2 F (37.3 C)     Temp Source Oral     SpO2 98 %     Weight 230 lb (104.3 kg)     Height 5\' 7"  (1.702 m)     Head Circumference      Peak Flow      Pain Score 10     Pain Loc  Pain Edu?      Excl. in [place]?     Constitutional: Alert and oriented. Well appearing and in no acute distress. Eyes: Conjunctivae are normal. PERRL. EOMI. Head: Atraumatic. Nose: No congestion/rhinnorhea. Mouth/Throat: Mucous membranes are moist.  Oropharynx non-erythematous. Neck: No stridor.   Cardiovascular: Normal rate, regular rhythm. Grossly normal heart sounds.  Good peripheral circulation. Respiratory: Normal respiratory effort.  No retractions. Lungs CTAB. Gastrointestinal: Soft and nontender. No distention. No abdominal bruits. No CVA tenderness. Musculoskeletal: No lower extremity tenderness nor edema.  No joint effusions. Neurologic:  Normal speech and language. No gross focal neurologic deficits are appreciated. No gait instability. Skin:  Skin is warm, dry and intact. Area beneath the left breast now with vesicles to the lateral aspect. Psychiatric: Mood and affect are normal. Speech and behavior are  normal.  ____________________________________________   LABS (all labs ordered are listed, but only abnormal results are displayed)  Labs Reviewed  COMPREHENSIVE METABOLIC PANEL - Abnormal; Notable for the following:       Result Value   Sodium 133 (*)    Chloride 92 (*)    Glucose, Bld 198 (*)    BUN 32 (*)    Creatinine, Ser 6.47 (*)    Calcium 8.7 (*)    GFR calc non Af Amer 6 (*)    GFR calc Af Amer 7 (*)    All other components within normal limits  CBC - Abnormal; Notable for the following:    RBC 3.66 (*)    All other components within normal limits  URINE CULTURE  TROPONIN I  LACTIC ACID, PLASMA  URINALYSIS, COMPLETE (UACMP) WITH MICROSCOPIC  LACTIC ACID, PLASMA   ____________________________________________  EKG  ED ECG REPORT I, Savana Spina J, the attending physician, personally viewed and interpreted this ECG.   Date: 05/04/2017  EKG Time: 0416  Rate: 81  Rhythm: normal EKG, normal sinus rhythm  Axis: Normal  Intervals:none  ST&T Change: Nonspecific  ____________________________________________  RADIOLOGY  Ct Renal Stone Study  Result Date: 05/04/2017 CLINICAL DATA:  Left flank pain EXAM: CT ABDOMEN AND PELVIS WITHOUT CONTRAST TECHNIQUE: Multidetector CT imaging of the abdomen and pelvis was performed following the standard protocol without IV contrast. COMPARISON:  06/01/2013 ultrasound and 07/08/2015 ultrasound FINDINGS: Lower chest: Borderline cardiomegaly with coronary arteriosclerosis and aortic atherosclerosis. Small hiatal hernia. Clear lung bases apart from minimal bibasilar atelectasis. Hepatobiliary: Status post cholecystectomy. The unenhanced liver is unremarkable. Pancreas: No ductal dilatation or definite mass of the pancreas. No peripancreatic inflammation. Spleen: No splenomegaly or mass. Adrenals/Urinary Tract: Normal bilateral adrenal glands. Nonspecific perinephric fat stranding which may be seen in urinary tract infections. No obstructive  uropathy or focal mass. Nondistended appearing bladder with slight wall thickening likely due to underdistention. A cystitis is not excluded. Stomach/Bowel: Nondistended stomach. Normal small bowel rotation without obstruction. Moderate degree of fecal retention within large bowel query constipation. The appendix is not definitively identified. No findings of acute appendicitis however. Vascular/Lymphatic: Aortoiliac atherosclerosis without aneurysm. No lymphadenopathy by CT size criteria. Reproductive: Status post hysterectomy.  No adnexal mass. Other: Fat noted within both inguinal canals. Musculoskeletal: Thoracolumbar spondylosis. No lytic or blastic disease. IMPRESSION: 1. Bilateral nonspecific perinephric fat stranding which can be seen in urinary tract infections. No obstructive uropathy. Slightly thick-walled appearing bladder is likely due to underdistention but correlate to exclude a cystitis which could have a similar appearance. 2. Coronary arteriosclerosis and aortic atherosclerosis. 3. Status post cholecystectomy.  Status post hysterectomy. 4. Moderate fecal retention within large  bowel, question constipation. Electronically Signed   By: [name] Royalty M.D.   On: 05/04/2017 02:49    ____________________________________________   PROCEDURES  Procedure(s) performed: None  Procedures  Critical Care performed: No  ____________________________________________   INITIAL IMPRESSION / ASSESSMENT AND PLAN / ED COURSE  Pertinent labs & imaging results that were available during my care of the patient were reviewed by me and considered in my medical decision making (see chart for details).  66 year old female who returns to the ED for chest pain, breast pain, left flank pain and heartburn. Area beneath the left breast now with vesicles which were not there on prior examination. Chest pain/heartburn is a new symptoms compared to 2 days ago. Will check screening lab work including troponin,  lactic acid and reassess.  Clinical Course as of May 04 405  Tue May 04, 2017  0405 Updated patient of laboratory and imaging results. Discussed with hospitalist to evaluate patient in the emergency department for admission.  [JS]    Clinical Course User Index [JS] Paulette Blanch, MD     ____________________________________________   FINAL CLINICAL IMPRESSION(S) / ED DIAGNOSES  Final diagnoses:  Lower urinary tract infectious disease  Flank pain  Herpes zoster without complication  Chest pain, unspecified type      NEW MEDICATIONS STARTED DURING THIS VISIT:  New Prescriptions   No medications on file     Note:  This document was prepared using Dragon voice recognition software and may include unintentional dictation errors.    Paulette Blanch, MD 05/04/17 [phone]

## 2017-05-04 NOTE — ED Notes (Signed)
Called lab, spoke with [facility] - informed her that lavender tube is only 1/4 full, and was a slow draw. Gwen said to send specimen and she would alert RN if there was an issue.

## 2017-05-04 NOTE — Progress Notes (Signed)
Pharmacy Antibiotic Note  Kendra [name] is a 66 y.o. female admitted on 05/04/2017 with pyelonephritis/vesicles.  Pharmacy has been consulted for ciprofloxacin/acyclovir dosing.  Plan: Patient received cipro 400 mg IV x 1 in ED  Will start cipro 400 mg IV Q24H based on MWF HD, to be given following HD  Will also start acyclovir 5 mg/kg of adjusted body weight (~79KG). Acyclovir 400 mg IV q24h after HD based on MWF HD.  Height: 5\' 7"  (170.2 cm) Weight: 230 lb (104.3 kg) IBW/kg (Calculated) : 61.6  Temp (24hrs), Avg:98.6 F (37 C), Min:98 F (36.7 C), Max:99.2 F (37.3 C)   Recent Labs Lab 05/02/17 0431 05/04/17 0135 05/04/17 0222  WBC 6.3  --  4.9  CREATININE 9.09* 6.47*  --   LATICACIDVEN  --   --  1.3    Estimated Creatinine Clearance: 10.5 mL/min (A) (by C-G formula based on SCr of 6.47 mg/dL (H)).    No Known Allergies  Thank you for allowing pharmacy to be a part of this patient's care.  Thomasenia Sales, PharmD, MBA, [facility] Clinical Pharmacist [facility]    05/04/2017

## 2017-05-04 NOTE — Progress Notes (Signed)
[facility] [place] NAME: Marithza Malachi    MR#:  [id number]  DATE OF BIRTH:  1951/08/27  SUBJECTIVE:  CHIEF COMPLAINT:   Chief Complaint  Patient presents with  . Back Pain   - Came in with fatigue, malaise, new painful rash on the left side of her chest on the back. Diagnosed with shingles. -Appears comfortable  REVIEW OF SYSTEMS:  Review of Systems  Constitutional: Positive for malaise/fatigue. Negative for chills and fever.  HENT: Negative for congestion, ear discharge, hearing loss and nosebleeds.   Eyes: Negative for blurred vision and double vision.  Respiratory: Negative for cough, shortness of breath and wheezing.   Cardiovascular: Negative for chest pain, palpitations and leg swelling.  Gastrointestinal: Negative for abdominal pain, constipation, diarrhea, nausea and vomiting.  Genitourinary: Negative for dysuria.  Musculoskeletal: Positive for myalgias.  Skin: Positive for rash.  Neurological: Negative for dizziness, speech change, focal weakness, seizures and headaches.  Psychiatric/Behavioral: Negative for depression.    DRUG ALLERGIES:  No Known Allergies  VITALS:  Blood pressure (!) 147/75, pulse 95, temperature 97.6 F (36.4 C), temperature source Oral, resp. rate 16, height 5\' 7"  (1.702 m), weight 104.3 kg (230 lb), SpO2 100 %.  PHYSICAL EXAMINATION:  Physical Exam  GENERAL:  66 y.o.-year-old patient lying in the bed with no acute distress.  EYES: Pupils equal, round, reactive to light and accommodation. No scleral icterus. Extraocular muscles intact.  HEENT: Head atraumatic, normocephalic. Oropharynx and nasopharynx clear.  NECK:  Supple, no jugular venous distention. No thyroid enlargement, no tenderness.  LUNGS: Normal breath sounds bilaterally, no wheezing, rales,rhonchi or crepitation. No use of accessory muscles of respiration.  CARDIOVASCULAR: S1, S2 normal. No  rubs, or gallops. 2/6 systolic murmur  present ABDOMEN: Soft, nontender, nondistended. Bowel sounds present. No organomegaly or mass.  EXTREMITIES: No pedal edema, cyanosis, or clubbing. Left upper arm AV fistula with good thrill. NEUROLOGIC: Cranial nerves II through XII are intact. Muscle strength 5/5 in all extremities. Sensation intact. Gait not checked.  PSYCHIATRIC: The patient is alert and oriented x 3.  SKIN: Cluster of erythematous lesions on the left upper back   LABORATORY PANEL:   CBC  Recent Labs Lab 05/04/17 0222  WBC 4.9  HGB 12.1  HCT 35.7  PLT 265   ------------------------------------------------------------------------------------------------------------------  Chemistries   Recent Labs Lab 05/04/17 0135  NA 133*  K 4.5  CL 92*  CO2 30  GLUCOSE 198*  BUN 32*  CREATININE 6.47*  CALCIUM 8.7*  AST 40  ALT 25  ALKPHOS 49  BILITOT 1.0   ------------------------------------------------------------------------------------------------------------------  Cardiac Enzymes  Recent Labs Lab 05/04/17 0222  TROPONINI <0.03   ------------------------------------------------------------------------------------------------------------------  RADIOLOGY:  Ct Renal Stone Study  Result Date: 05/04/2017 CLINICAL DATA:  Left flank pain EXAM: CT ABDOMEN AND PELVIS WITHOUT CONTRAST TECHNIQUE: Multidetector CT imaging of the abdomen and pelvis was performed following the standard protocol without IV contrast. COMPARISON:  06/01/2013 ultrasound and 07/08/2015 ultrasound FINDINGS: Lower chest: Borderline cardiomegaly with coronary arteriosclerosis and aortic atherosclerosis. Small hiatal hernia. Clear lung bases apart from minimal bibasilar atelectasis. Hepatobiliary: Status post cholecystectomy. The unenhanced liver is unremarkable. Pancreas: No ductal dilatation or definite mass of the pancreas. No peripancreatic inflammation. Spleen: No splenomegaly or mass. Adrenals/Urinary Tract: Normal bilateral adrenal  glands. Nonspecific perinephric fat stranding which may be seen in urinary tract infections. No obstructive uropathy or focal mass. Nondistended appearing bladder with slight wall thickening likely due to underdistention. A cystitis  is not excluded. Stomach/Bowel: Nondistended stomach. Normal small bowel rotation without obstruction. Moderate degree of fecal retention within large bowel query constipation. The appendix is not definitively identified. No findings of acute appendicitis however. Vascular/Lymphatic: Aortoiliac atherosclerosis without aneurysm. No lymphadenopathy by CT size criteria. Reproductive: Status post hysterectomy.  No adnexal mass. Other: Fat noted within both inguinal canals. Musculoskeletal: Thoracolumbar spondylosis. No lytic or blastic disease. IMPRESSION: 1. Bilateral nonspecific perinephric fat stranding which can be seen in urinary tract infections. No obstructive uropathy. Slightly thick-walled appearing bladder is likely due to underdistention but correlate to exclude a cystitis which could have a similar appearance. 2. Coronary arteriosclerosis and aortic atherosclerosis. 3. Status post cholecystectomy.  Status post hysterectomy. 4. Moderate fecal retention within large bowel, question constipation. Electronically Signed   By: [name] Royalty M.D.   On: 05/04/2017 02:49    EKG:   Orders placed or performed during the hospital encounter of 05/04/17  . ED EKG  . ED EKG  . EKG 12-Lead  . EKG 12-Lead    ASSESSMENT AND PLAN:   66 year old female with past medical history significant for end-stage renal disease with Monday Wednesday Friday hemodialysis, diabetic retinopathy, neuropathy, hypertension, diabetes, osteoarthritis presents to hospital secondary to left-sided chest pain with new onset rash.  #1 acute cystitis-sent for cultures -Continue Cipro, renally dosed  #2 herpes zoster-started on IV acyclovir -Changed to oral Valtrex-renally dosed -Significant pain, pain meds  added. Capsaicin cream, neurontin as well Monitor - airborne isolation  #3 end-stage renal disease-on Monday Wednesday Friday hemodialysis-appreciate nephrology consult. -Last dialysis yesterday, to be dialysis tomorrow per schedule  #4 diabetes mellitus-continue Lantus and sliding scale insulin.  #5 DVT prophylaxis-on subcutaneous heparin  Patient is independent and has been ambulatory in the room    All the records are reviewed and case discussed with Care Management/Social Workerr. Management plans discussed with the patient, family and they are in agreement.  CODE STATUS:  Full Code  TOTAL TIME TAKING CARE OF THIS PATIENT: 37 minutes.   POSSIBLE D/C IN 1-2 DAYS, DEPENDING ON CLINICAL CONDITION.   Gladstone Lighter M.D on 05/04/2017 at 2:19 PM  Between 7am to 6pm - Pager - [phone]  After 6pm go to [email or web address] - password [email or web address] [place] [facility]  Office  [phone]  CC: Primary care physician; [name], MD

## 2017-05-04 NOTE — ED Notes (Signed)
Patient c/o left-sided flank pain and headache. Pt was diagnosed with UTI yesterday.  Patient reports she is here for pain control. Patient AO X4. Patient is a dialysis patient, MWF, and did go to dialysis today.

## 2017-05-04 NOTE — ED Notes (Signed)
Unsuccessful attempt at peripheral IV insertion; blood drawn

## 2017-05-04 NOTE — Progress Notes (Signed)
[facility]  ROUNDING NOTE   Subjective:  Patient well-known to [place] as we follow her for outpatient hemodialysis. She had hemodialysis yesterday. She is now admitted for pyelonephritis and also appears to have herpes zoster infection.    Objective:  Vital signs in last 24 hours:  Temp:  [97.6 F (36.4 C)-99.2 F (37.3 C)] 97.6 F (36.4 C) (07/17 1241) Pulse Rate:  [88-103] 95 (07/17 1241) Resp:  [16-28] 16 (07/17 1241) BP: (120-172)/(61-106) 147/75 (07/17 1241) SpO2:  [95 %-100 %] 100 % (07/17 1241) Weight:  [104.3 kg (230 lb)] 104.3 kg (230 lb) (07/16 2221)  Weight change:  Filed Weights   05/03/17 2221  Weight: 104.3 kg (230 lb)    Intake/Output: No intake/output data recorded.   Intake/Output this shift:  Total I/O In: 250 [P.O.:250] Out: 100 [Urine:100]  Physical Exam: General: No acute distress  Head: Normocephalic, atraumatic. Moist oral mucosal membranes  Eyes: Anicteric  Neck: Supple, trachea midline  Lungs:  Clear to auscultation, normal effort  Heart: S1S2 no rubs  Abdomen:  Soft, nontender, bowel sounds present  Extremities: Trace peripheral edema.  Neurologic: Awake, alert, following commands  Skin: Herpes zoster left lateral chest  Access: LUE AVF    Basic Metabolic Panel:  Recent Labs Lab 05/02/17 0431 05/04/17 0135 05/04/17 0222  NA 138 133*  --   K 4.8 4.5  --   CL 99* 92*  --   CO2 29 30  --   GLUCOSE 129* 198*  --   BUN 62* 32*  --   CREATININE 9.09* 6.47*  --   CALCIUM 9.2 8.7*  --   MG  --   --  2.2    Liver Function Tests:  Recent Labs Lab 05/04/17 0135  AST 40  ALT 25  ALKPHOS 49  BILITOT 1.0  PROT 8.0  ALBUMIN 4.1   No results for input(s): LIPASE, AMYLASE in the last 168 hours. No results for input(s): AMMONIA in the last 168 hours.  CBC:  Recent Labs Lab 05/02/17 0431 05/04/17 0222  WBC 6.3 4.9  HGB 12.2 12.1  HCT 35.3 35.7  MCV 99.8 97.5  PLT 250 265    Cardiac Enzymes:  Recent  Labs Lab 05/04/17 0222  TROPONINI <0.03    BNP: Invalid input(s): POCBNP  CBG:  Recent Labs Lab 05/04/17 0817 05/04/17 1158  GLUCAP 202* 179*    Microbiology: Results for orders placed or performed during the hospital encounter of 05/04/17  MRSA PCR Screening     Status: None   Collection Time: 05/04/17  8:44 AM  Result Value Ref Range Status   MRSA by PCR NEGATIVE NEGATIVE Final    Comment:        The GeneXpert MRSA Assay (FDA approved for NASAL specimens only), is one component of a comprehensive MRSA colonization surveillance program. It is not intended to diagnose MRSA infection nor to guide or monitor treatment for MRSA infections.     Coagulation Studies: No results for input(s): LABPROT, INR in the last 72 hours.  Urinalysis:  Recent Labs  05/02/17 0431 05/04/17 0838  COLORURINE YELLOW* YELLOW*  LABSPEC 1.013 1.009  PHURINE 5.0 7.0  GLUCOSEU 50* 150*  HGBUR MODERATE* SMALL*  BILIRUBINUR NEGATIVE NEGATIVE  KETONESUR NEGATIVE NEGATIVE  PROTEINUR 100* 100*  NITRITE NEGATIVE NEGATIVE  LEUKOCYTESUR TRACE* SMALL*      Imaging: Ct Renal Stone Study  Result Date: 05/04/2017 CLINICAL DATA:  Left flank pain EXAM: CT ABDOMEN AND PELVIS WITHOUT CONTRAST TECHNIQUE:  Multidetector CT imaging of the abdomen and pelvis was performed following the standard protocol without IV contrast. COMPARISON:  06/01/2013 ultrasound and 07/08/2015 ultrasound FINDINGS: Lower chest: Borderline cardiomegaly with coronary arteriosclerosis and aortic atherosclerosis. Small hiatal hernia. Clear lung bases apart from minimal bibasilar atelectasis. Hepatobiliary: Status post cholecystectomy. The unenhanced liver is unremarkable. Pancreas: No ductal dilatation or definite mass of the pancreas. No peripancreatic inflammation. Spleen: No splenomegaly or mass. Adrenals/Urinary Tract: Normal bilateral adrenal glands. Nonspecific perinephric fat stranding which may be seen in urinary tract  infections. No obstructive uropathy or focal mass. Nondistended appearing bladder with slight wall thickening likely due to underdistention. A cystitis is not excluded. Stomach/Bowel: Nondistended stomach. Normal small bowel rotation without obstruction. Moderate degree of fecal retention within large bowel query constipation. The appendix is not definitively identified. No findings of acute appendicitis however. Vascular/Lymphatic: Aortoiliac atherosclerosis without aneurysm. No lymphadenopathy by CT size criteria. Reproductive: Status post hysterectomy.  No adnexal mass. Other: Fat noted within both inguinal canals. Musculoskeletal: Thoracolumbar spondylosis. No lytic or blastic disease. IMPRESSION: 1. Bilateral nonspecific perinephric fat stranding which can be seen in urinary tract infections. No obstructive uropathy. Slightly thick-walled appearing bladder is likely due to underdistention but correlate to exclude a cystitis which could have a similar appearance. 2. Coronary arteriosclerosis and aortic atherosclerosis. 3. Status post cholecystectomy.  Status post hysterectomy. 4. Moderate fecal retention within large bowel, question constipation. Electronically Signed   By: [name] Royalty M.D.   On: 05/04/2017 02:49     Medications:   . [START ON 05/05/2017] ciprofloxacin     . atorvastatin  30 mg Oral Daily  . calcium acetate  1,334 mg Oral TID WC  . capsaicin   Topical BID  . clopidogrel  75 mg Oral Daily  . docusate sodium  100 mg Oral BID  . gabapentin  300 mg Oral BID  . heparin  5,000 Units Subcutaneous Q8H  . insulin aspart  0-15 Units Subcutaneous TID WC  . insulin glargine  16 Units Subcutaneous QHS  . levothyroxine  150 mcg Oral QAC breakfast  . [START ON 05/05/2017] valACYclovir  1,000 mg Oral Q24H   acetaminophen **OR** acetaminophen, chlorpheniramine-HYDROcodone, furosemide, morphine injection, ondansetron **OR** ondansetron (ZOFRAN) IV, oxyCODONE, senna-docusate  Assessment/ Plan:   66 y.o. female with past medical history of end-stage renal disease on hemodialysis MWF, anemia chronic kidney disease, secondary hyperparathyroidism, hypertension, hypothyroidism, diabetes mellitus type 2, osteoarthritis, diabetic retinopathy who was admitted for pyelonephritis and also was found to have herpes zoster infection.  [facility] [place]/MWF-2  1.  End-stage renal disease on hemodialysis MWF:  Patient had hemodialysis yesterday.  No acute indication for dialysis today.  We will plan for dialysis again tomorrow.  2.  Anemia chronic kidney disease.  Hemoglobin currently 12.1.  No indication for Procrit at the moment.  3.  Secondary hyperparathyroidism. We will check intact PTH as well as phosphorus with dialysis tomorrow.  Continue PhosLo 2 tablets by mouth 3 times a day with meals.  3.  Diabetes mellitus type 2 with chronic kidney disease.  Continue the patient on long and short acting insulin as per hospitalist.   LOS: 0 Donnell Wion 7/17/20184:01 PM

## 2017-05-04 NOTE — Progress Notes (Signed)
Pharmacy Antibiotic Note  Kendra [name] is a 66 y.o. female admitted on 05/04/2017 with pyelonephritis/vesicles.  Pharmacy has been consulted for ciprofloxacin/acyclovir dosing.  Plan: Patient received cipro 400 mg IV x 1 in ED  Will start cipro 400 mg IV every MWF after dialysis sessions. Will also start acyclovir 10 mg/kg of adjusted body weight (800 mg IV q8h). Acyclovir 800 mg IV q8h per CrCl 10 - 25 ml/min  Height: 5\' 7"  (170.2 cm) Weight: 230 lb (104.3 kg) IBW/kg (Calculated) : 61.6  Temp (24hrs), Avg:99.2 F (37.3 C), Min:99.2 F (37.3 C), Max:99.2 F (37.3 C)   Recent Labs Lab 05/02/17 0431 05/04/17 0135 05/04/17 0222  WBC 6.3  --  4.9  CREATININE 9.09* 6.47*  --   LATICACIDVEN  --   --  1.3    Estimated Creatinine Clearance: 10.5 mL/min (A) (by C-G formula based on SCr of 6.47 mg/dL (H)).    No Known Allergies  Thank you for allowing pharmacy to be a part of this patient's care.  Tobie Lords, PharmD, BCPS Clinical Pharmacist 05/04/2017

## 2017-05-04 NOTE — H&P (Addendum)
Kendra [name] is an 66 y.o. female.   Chief Complaint: Back pain HPI: The patient with past medical history of diabetes, hypertension, hypothyroidism as well as end-stage renal disease on dialysis presents emergency department complaining of back pain. The patient has been feeling unwell for 5 days. She denies fever but admits to occasional nausea. Her flanks of heard bilaterally and she also complains of back pain is specifically radiates to her left breast. She denies fevers but admits to weakness. CT of the abdomen in the emergency department revealed perinephric stranding bilaterally consistent with pyelonephritis. She was given a dose of ciprofloxacin prior to the emergency [place] staff called the hospitalist service for further management.  Past Medical History:  Diagnosis Date  . Anemia of chronic renal failure   . Diabetic neuropathy ([place])   . Diabetic retinopathy ([place])   . GERD (gastroesophageal reflux disease)   . Hyperparathyroidism, secondary renal ([place])   . Hypertension   . Hypothyroidism   . Insulin dependent diabetes mellitus ([place])   . Osteoarthritis    bilateral shoulders  . Primary hypertension     Past Surgical History:  Procedure Laterality Date  . ABDOMINAL HYSTERECTOMY    . BASCILIC VEIN TRANSPOSITION Left 03/04/2016   Procedure: BASCILIC VEIN TRANSPOSITION ( STAGE 1 );  Surgeon: Algernon Huxley, MD;  Location: [facility] ORS;  Service: Vascular;  Laterality: Left;  . CHOLECYSTECTOMY    . PERIPHERAL VASCULAR CATHETERIZATION N/A 01/22/2016   Procedure: Dialysis/Perma Catheter Insertion;  Surgeon: Katha Cabal, MD;  Location: [place] [facility];  Service: Cardiovascular;  Laterality: N/A;  . PERIPHERAL VASCULAR CATHETERIZATION Left 05/14/2016   Procedure: A/V Shuntogram/Fistulagram;  Surgeon: Algernon Huxley, MD;  Location: [place] [facility];  Service: Cardiovascular;  Laterality: Left;  . PERIPHERAL VASCULAR CATHETERIZATION N/A 05/14/2016   Procedure: A/V Shunt  Intervention;  Surgeon: Algernon Huxley, MD;  Location: [place] [facility];  Service: Cardiovascular;  Laterality: N/A;  . PERIPHERAL VASCULAR CATHETERIZATION N/A 06/25/2016   Procedure: Dialysis/Perma Catheter Removal;  Surgeon: Algernon Huxley, MD;  Location: [place] [facility];  Service: Cardiovascular;  Laterality: N/A;  . PERIPHERAL VASCULAR CATHETERIZATION Left 08/03/2016   Procedure: A/V Shuntogram/Fistulagram;  Surgeon: Algernon Huxley, MD;  Location: [place] [facility];  Service: Cardiovascular;  Laterality: Left;  . PERIPHERAL VASCULAR CATHETERIZATION N/A 08/03/2016   Procedure: A/V Shunt Intervention;  Surgeon: Algernon Huxley, MD;  Location: [place] [facility];  Service: Cardiovascular;  Laterality: N/A;  . PERIPHERAL VASCULAR CATHETERIZATION Left 08/07/2016   Procedure: A/V Shuntogram/Fistulagram;  Surgeon: Katha Cabal, MD;  Location: [place] [facility];  Service: Cardiovascular;  Laterality: Left;  . PERIPHERAL VASCULAR CATHETERIZATION Left 09/22/2016   Procedure: A/V Fistulagram;  Surgeon: Katha Cabal, MD;  Location: [place] [facility];  Service: Cardiovascular;  Laterality: Left;  . THYROIDECTOMY      Family History  Problem Relation Age of Onset  . Renal Disease Brother        with renal transplant  . Diabetes Mellitus II Father   . Diabetes Mellitus II Mother   . Diabetes Mellitus II Brother    Social History:  reports that she has never smoked. She has never used smokeless tobacco. She reports that she does not drink alcohol or use drugs.  Allergies: No Known Allergies  Medications Prior to Admission  Medication Sig Dispense Refill  . acetaminophen (TYLENOL) 500 MG tablet Take 500 mg by mouth every 6 (six) hours as needed  for mild pain.     . Calcium Acetate 668 (169 Ca) MG TABS Take 2 capsules by mouth 3 (three) times daily with meals.     . cephALEXin (KEFLEX) 500 MG capsule Take 1 capsule (500 mg total) by mouth 3 (three) times daily. 21 capsule 0  .  chlorpheniramine-HYDROcodone (TUSSIONEX PENNKINETIC ER) 10-8 MG/5ML SUER Take 5 mLs by mouth 2 (two) times daily. 70 mL 0  . clopidogrel (PLAVIX) 75 MG tablet Take 75 mg by mouth daily.    . furosemide (LASIX) 40 MG tablet Take 80 mg by mouth daily as needed for fluid.     [name] insulin detemir (LEVEMIR) 100 UNIT/ML injection Inject 26 Units into the skin at bedtime.    . insulin lispro (HUMALOG) 100 UNIT/ML injection Inject 12-15 Units into the skin 2 (two) times daily. 12 in the morning and 15 in the evening    . levothyroxine (SYNTHROID, LEVOTHROID) 150 MCG tablet Take 150 mcg by mouth daily before breakfast.    . atorvastatin (LIPITOR) 40 MG tablet Take 30 mg by mouth daily.    . Insulin Pen Needle (PEN NEEDLES 31GX5/16") 31G X 8 MM MISC Use for insulin injection TID    . sennosides-docusate sodium (SENOKOT-S) 8.6-50 MG tablet Take 1 tablet by mouth daily as needed for constipation. 1-2 tabs      Results for orders placed or performed during the hospital encounter of 05/04/17 (from the past 48 hour(s))  Comprehensive metabolic panel     Status: Abnormal   Collection Time: 05/04/17  1:35 AM  Result Value Ref Range   Sodium 133 (L) 135 - 145 mmol/L   Potassium 4.5 3.5 - 5.1 mmol/L    Comment: HEMOLYSIS AT THIS LEVEL MAY AFFECT RESULT   Chloride 92 (L) 101 - 111 mmol/L   CO2 30 22 - 32 mmol/L   Glucose, Bld 198 (H) 65 - 99 mg/dL   BUN 32 (H) 6 - 20 mg/dL   Creatinine, Ser 6.47 (H) 0.44 - 1.00 mg/dL   Calcium 8.7 (L) 8.9 - 10.3 mg/dL   Total Protein 8.0 6.5 - 8.1 g/dL   Albumin 4.1 3.5 - 5.0 g/dL   AST 40 15 - 41 U/L    Comment: HEMOLYSIS AT THIS LEVEL MAY AFFECT RESULT   ALT 25 14 - 54 U/L   Alkaline Phosphatase 49 38 - 126 U/L   Total Bilirubin 1.0 0.3 - 1.2 mg/dL    Comment: HEMOLYSIS AT THIS LEVEL MAY AFFECT RESULT   GFR calc non Af Amer 6 (L) >60 mL/min   GFR calc Af Amer 7 (L) >60 mL/min    Comment: (NOTE) The eGFR has been calculated using the CKD EPI equation. This calculation  has not been validated in all clinical situations. eGFR's persistently <60 mL/min signify possible Chronic Kidney Disease.    Anion gap 11 5 - 15  CBC     Status: Abnormal   Collection Time: 05/04/17  2:22 AM  Result Value Ref Range   WBC 4.9 3.6 - 11.0 K/uL   RBC 3.66 (L) 3.80 - 5.20 MIL/uL   Hemoglobin 12.1 12.0 - 16.0 g/dL   HCT 35.7 35.0 - 47.0 %   MCV 97.5 80.0 - 100.0 fL   MCH 33.1 26.0 - 34.0 pg   MCHC 34.0 32.0 - 36.0 g/dL   RDW 13.1 11.5 - 14.5 %   Platelets 265 150 - 440 K/uL  Troponin I     Status: None   Collection  Time: 05/04/17  2:22 AM  Result Value Ref Range   Troponin I <0.03 <0.03 ng/mL  Lactic acid, plasma     Status: None   Collection Time: 05/04/17  2:22 AM  Result Value Ref Range   Lactic Acid, Venous 1.3 0.5 - 1.9 mmol/L  TSH     Status: Abnormal   Collection Time: 05/04/17  2:22 AM  Result Value Ref Range   TSH 10.480 (H) 0.350 - 4.500 uIU/mL    Comment: Performed by a 3rd Generation assay with a functional sensitivity of <=0.01 uIU/mL.   Ct Renal Stone Study  Result Date: 05/04/2017 CLINICAL DATA:  Left flank pain EXAM: CT ABDOMEN AND PELVIS WITHOUT CONTRAST TECHNIQUE: Multidetector CT imaging of the abdomen and pelvis was performed following the standard protocol without IV contrast. COMPARISON:  06/01/2013 ultrasound and 07/08/2015 ultrasound FINDINGS: Lower chest: Borderline cardiomegaly with coronary arteriosclerosis and aortic atherosclerosis. Small hiatal hernia. Clear lung bases apart from minimal bibasilar atelectasis. Hepatobiliary: Status post cholecystectomy. The unenhanced liver is unremarkable. Pancreas: No ductal dilatation or definite mass of the pancreas. No peripancreatic inflammation. Spleen: No splenomegaly or mass. Adrenals/Urinary Tract: Normal bilateral adrenal glands. Nonspecific perinephric fat stranding which may be seen in urinary tract infections. No obstructive uropathy or focal mass. Nondistended appearing bladder with slight wall  thickening likely due to underdistention. A cystitis is not excluded. Stomach/Bowel: Nondistended stomach. Normal small bowel rotation without obstruction. Moderate degree of fecal retention within large bowel query constipation. The appendix is not definitively identified. No findings of acute appendicitis however. Vascular/Lymphatic: Aortoiliac atherosclerosis without aneurysm. No lymphadenopathy by CT size criteria. Reproductive: Status post hysterectomy.  No adnexal mass. Other: Fat noted within both inguinal canals. Musculoskeletal: Thoracolumbar spondylosis. No lytic or blastic disease. IMPRESSION: 1. Bilateral nonspecific perinephric fat stranding which can be seen in urinary tract infections. No obstructive uropathy. Slightly thick-walled appearing bladder is likely due to underdistention but correlate to exclude a cystitis which could have a similar appearance. 2. Coronary arteriosclerosis and aortic atherosclerosis. 3. Status post cholecystectomy.  Status post hysterectomy. 4. Moderate fecal retention within large bowel, question constipation. Electronically Signed   By: [name] Royalty M.D.   On: 05/04/2017 02:49    Review of Systems  Constitutional: Negative for chills and fever.  HENT: Negative for sore throat and tinnitus.   Eyes: Negative for blurred vision and redness.  Respiratory: Negative for cough and shortness of breath.   Cardiovascular: Negative for chest pain, palpitations, orthopnea and PND.  Gastrointestinal: Positive for heartburn. Negative for abdominal pain, diarrhea, nausea and vomiting.  Genitourinary: Positive for flank pain. Negative for dysuria, frequency and urgency.  Musculoskeletal: Positive for back pain (radiating to her left breast). Negative for joint pain and myalgias.  Skin: Negative for rash.       No lesions  Neurological: Negative for speech change, focal weakness and weakness.  Endo/Heme/Allergies: Does not bruise/bleed easily.       No temperature  intolerance  Psychiatric/Behavioral: Negative for depression and suicidal ideas.    Blood pressure (!) 164/61, pulse (!) 103, temperature 98 F (36.7 C), temperature source Oral, resp. rate 18, height '5\' 7"'$  (1.702 m), weight 104.3 kg (230 lb), SpO2 100 %. Physical Exam  Vitals reviewed. Constitutional: She is oriented to person, place, and time. She appears well-developed and well-nourished. No distress.  HENT:  Head: Normocephalic and atraumatic.  Mouth/Throat: Oropharynx is clear and moist.  Eyes: Pupils are equal, round, and reactive to light. Conjunctivae and EOM are normal.  Neck: Normal range of motion. Neck supple. No tracheal deviation present. No thyromegaly present.  Cardiovascular: Normal rate, regular rhythm and normal heart sounds.  Exam reveals no gallop and no friction rub.   No murmur heard. Respiratory: Effort normal and breath sounds normal.  GI: Soft. Bowel sounds are normal. She exhibits no distension. There is no tenderness.  Genitourinary:  Genitourinary Comments: Deferred  Musculoskeletal: Normal range of motion. She exhibits no edema.  Lymphadenopathy:    She has no cervical adenopathy.  Neurological: She is alert and oriented to person, place, and time. No cranial nerve deficit. She exhibits normal muscle tone.  Skin: Skin is warm and dry. Rash (Vesicles lateral aspect of left breast around to posterior chest) noted.  Psychiatric: She has a normal mood and affect. Her behavior is normal. Judgment and thought content normal.     Assessment/Plan This is a 66 year old female admitted for pyelonephritis. 1. Pyelonephritis: Continue IV ciprofloxacin. The patient is consistently taking by mouth intake we will transition her to oral antibiotics. She does not meet criteria for sepsis. 2. Shingles: Acyclovir. No evidence of dissemination 3. ESRD: On hemodialysis Monday was a Friday. Result nephrology for continuation of dialysis 4. Diabetes mellitus type 2: Continue  basal insulin adjusted for hospital diet. Sliding suicidal hospitalized as well. 5. Coronary artery disease: Stable. Continue Plavix 6. Hypothyroidism: Continue Synthroid 7. DVT prophylaxis: Heparin 8. GI prophylaxis: None The patient is a full code. Time spent on admission orders and patient care possibly 45 minutes   Harrie Foreman, MD 05/04/2017, 7:53 AM

## 2017-05-04 NOTE — Care Management (Signed)
Chronic HD patient.  Amanda Morris HD liaison notified of admission.  

## 2017-05-05 DIAGNOSIS — N12 Tubulo-interstitial nephritis, not specified as acute or chronic: Secondary | ICD-10-CM | POA: Diagnosis not present

## 2017-05-05 LAB — URINE CULTURE

## 2017-05-05 LAB — HEMOGLOBIN A1C
Hgb A1c MFr Bld: 6.5 % — ABNORMAL HIGH (ref 4.8–5.6)
Mean Plasma Glucose: 140 mg/dL

## 2017-05-05 LAB — GLUCOSE, CAPILLARY
Glucose-Capillary: 101 mg/dL — ABNORMAL HIGH (ref 65–99)
Glucose-Capillary: 154 mg/dL — ABNORMAL HIGH (ref 65–99)
Glucose-Capillary: 267 mg/dL — ABNORMAL HIGH (ref 65–99)

## 2017-05-05 LAB — BASIC METABOLIC PANEL
Anion gap: 10 (ref 5–15)
BUN: 46 mg/dL — ABNORMAL HIGH (ref 6–20)
CALCIUM: 8.4 mg/dL — AB (ref 8.9–10.3)
CO2: 30 mmol/L (ref 22–32)
CREATININE: 8.36 mg/dL — AB (ref 0.44–1.00)
Chloride: 93 mmol/L — ABNORMAL LOW (ref 101–111)
GFR calc non Af Amer: 4 mL/min — ABNORMAL LOW (ref >60)
GFR, EST AFRICAN AMERICAN: 5 mL/min — AB (ref >60)
Glucose, Bld: 128 mg/dL — ABNORMAL HIGH (ref 65–99)
Potassium: 4.6 mmol/L (ref 3.5–5.1)
SODIUM: 133 mmol/L — AB (ref 135–145)

## 2017-05-05 MED ORDER — CIPROFLOXACIN HCL 500 MG PO TABS
500.0000 mg | ORAL_TABLET | ORAL | Status: DC
  Administered 2017-05-05 – 2017-05-07 (×3): 500 mg via ORAL
  Filled 2017-05-05 (×3): qty 1

## 2017-05-05 MED ORDER — OXYCODONE HCL 5 MG PO TABS
5.0000 mg | ORAL_TABLET | Freq: Four times a day (QID) | ORAL | Status: DC | PRN
  Administered 2017-05-06 (×2): 5 mg via ORAL
  Filled 2017-05-05 (×2): qty 1

## 2017-05-05 NOTE — Progress Notes (Signed)
Post hd assessment 

## 2017-05-05 NOTE — Progress Notes (Signed)
Pre hd assessment  

## 2017-05-05 NOTE — Care Management CC44 (Signed)
Condition Code 44 Documentation Completed  Patient Details  Name: JARROD BODKINS MRN: [id number] Date of Birth: 12-16-1950   Condition Code 44 given:  Yes Patient signature on Condition Code 44 notice:  Yes (Patient lethargic.  Telephone reviewed with patient's sister Ms. Love) Documentation of 2 MD's agreement:    Code 44 added to claim:       Beverly Sessions, RN 05/05/2017, 3:34 PM

## 2017-05-05 NOTE — Progress Notes (Signed)
End of hd, goal met, tx ended, pt alert, no c/o, stable, 2L removed.

## 2017-05-05 NOTE — Progress Notes (Signed)
[facility] [place] NAME: Kendra [name]    MR#:  [id number]  DATE OF BIRTH:  01-12-1951  SUBJECTIVE:  CHIEF COMPLAINT:   Chief Complaint  Patient presents with  . Back Pain   - Feels very tired and sleepy today. Last pain medication as this morning. For dialysis this afternoon. -Still complains of pain at the shingles site  REVIEW OF SYSTEMS:  Review of Systems  Constitutional: Positive for malaise/fatigue. Negative for chills and fever.  HENT: Negative for congestion, ear discharge, hearing loss and nosebleeds.   Eyes: Negative for blurred vision and double vision.  Respiratory: Negative for cough, shortness of breath and wheezing.   Cardiovascular: Negative for chest pain, palpitations and leg swelling.  Gastrointestinal: Negative for abdominal pain, constipation, diarrhea, nausea and vomiting.  Genitourinary: Negative for dysuria.  Musculoskeletal: Positive for myalgias.  Skin: Positive for rash.  Neurological: Negative for dizziness, speech change, focal weakness, seizures and headaches.  Psychiatric/Behavioral: Negative for depression.    DRUG ALLERGIES:  No Known Allergies  VITALS:  Blood pressure 127/64, pulse 90, temperature 99.7 F (37.6 C), temperature source Oral, resp. rate 18, height 5\' 7"  (1.702 m), weight 105.4 kg (232 lb 6.4 oz), SpO2 93 %.  PHYSICAL EXAMINATION:  Physical Exam  GENERAL:  66 y.o.-year-old patient lying in the bed with no acute distress.  EYES: Pupils equal, round, reactive to light and accommodation. No scleral icterus. Extraocular muscles intact.  HEENT: Head atraumatic, normocephalic. Oropharynx and nasopharynx clear.  NECK:  Supple, no jugular venous distention. No thyroid enlargement, no tenderness.  LUNGS: Normal breath sounds bilaterally, no wheezing, rales,rhonchi or crepitation. No use of accessory muscles of respiration.  CARDIOVASCULAR: S1, S2 normal. No  rubs, or gallops. 2/6  systolic murmur present ABDOMEN: Soft, nontender, nondistended. Bowel sounds present. No organomegaly or mass.  EXTREMITIES: No pedal edema, cyanosis, or clubbing. Left upper arm AV fistula with good thrill. NEUROLOGIC: Cranial nerves II through XII are intact. Muscle strength 5/5 in all extremities. Sensation intact. Gait not checked.  PSYCHIATRIC: The patient is alert and oriented x 3.  SKIN: Cluster of erythematous lesions on the left upper back-more vesicular in appearance today. No scabbing yet   LABORATORY PANEL:   CBC  Recent Labs Lab 05/04/17 0222  WBC 4.9  HGB 12.1  HCT 35.7  PLT 265   ------------------------------------------------------------------------------------------------------------------  Chemistries   Recent Labs Lab 05/04/17 0135 05/04/17 0222 05/05/17 0343  NA 133*  --  133*  K 4.5  --  4.6  CL 92*  --  93*  CO2 30  --  30  GLUCOSE 198*  --  128*  BUN 32*  --  46*  CREATININE 6.47*  --  8.36*  CALCIUM 8.7*  --  8.4*  MG  --  2.2  --   AST 40  --   --   ALT 25  --   --   ALKPHOS 49  --   --   BILITOT 1.0  --   --    ------------------------------------------------------------------------------------------------------------------  Cardiac Enzymes  Recent Labs Lab 05/04/17 0222  TROPONINI <0.03   ------------------------------------------------------------------------------------------------------------------  RADIOLOGY:  Ct Renal Stone Study  Result Date: 05/04/2017 CLINICAL DATA:  Left flank pain EXAM: CT ABDOMEN AND PELVIS WITHOUT CONTRAST TECHNIQUE: Multidetector CT imaging of the abdomen and pelvis was performed following the standard protocol without IV contrast. COMPARISON:  06/01/2013 ultrasound and 07/08/2015 ultrasound FINDINGS: Lower chest: Borderline cardiomegaly with coronary arteriosclerosis and  aortic atherosclerosis. Small hiatal hernia. Clear lung bases apart from minimal bibasilar atelectasis. Hepatobiliary: Status post  cholecystectomy. The unenhanced liver is unremarkable. Pancreas: No ductal dilatation or definite mass of the pancreas. No peripancreatic inflammation. Spleen: No splenomegaly or mass. Adrenals/Urinary Tract: Normal bilateral adrenal glands. Nonspecific perinephric fat stranding which may be seen in urinary tract infections. No obstructive uropathy or focal mass. Nondistended appearing bladder with slight wall thickening likely due to underdistention. A cystitis is not excluded. Stomach/Bowel: Nondistended stomach. Normal small bowel rotation without obstruction. Moderate degree of fecal retention within large bowel query constipation. The appendix is not definitively identified. No findings of acute appendicitis however. Vascular/Lymphatic: Aortoiliac atherosclerosis without aneurysm. No lymphadenopathy by CT size criteria. Reproductive: Status post hysterectomy.  No adnexal mass. Other: Fat noted within both inguinal canals. Musculoskeletal: Thoracolumbar spondylosis. No lytic or blastic disease. IMPRESSION: 1. Bilateral nonspecific perinephric fat stranding which can be seen in urinary tract infections. No obstructive uropathy. Slightly thick-walled appearing bladder is likely due to underdistention but correlate to exclude a cystitis which could have a similar appearance. 2. Coronary arteriosclerosis and aortic atherosclerosis. 3. Status post cholecystectomy.  Status post hysterectomy. 4. Moderate fecal retention within large bowel, question constipation. Electronically Signed   By: [name] Royalty M.D.   On: 05/04/2017 02:49    EKG:   Orders placed or performed during the hospital encounter of 05/04/17  . ED EKG  . ED EKG  . EKG 12-Lead  . EKG 12-Lead    ASSESSMENT AND PLAN:   66 year old female with past medical history significant for end-stage renal disease with Monday Wednesday Friday hemodialysis, diabetic retinopathy, neuropathy, hypertension, diabetes, osteoarthritis presents to hospital  secondary to left-sided chest pain with new onset rash.  #1 acute cystitis-sent for cultures -Continue Cipro, renally dosed-Step after 5 days  #2 herpes zoster-was on IV acyclovir -Changed to oral Valtrex-renally dosed -Significant pain, pain meds added. Capsaicin cream Monitor - airborne isolation  #3 end-stage renal disease-on Monday Wednesday Friday hemodialysis-appreciate nephrology consult. -Dialysis ordered today per schedule  #4 diabetes mellitus-continue Lantus and sliding scale insulin.  #5 confusion-acute encephalopathy. Could be from pain medications. Discontinue morphine, however patient has not used it. Discontinue Neurontin. Monitor on oxycodone.  #6 DVT prophylaxis-on subcutaneous heparin  Patient is independent and has been ambulatory in the room    All the records are reviewed and case discussed with Care Management/Social Workerr. Management plans discussed with the patient, family and they are in agreement.  CODE STATUS:  Full Code  TOTAL TIME TAKING CARE OF THIS PATIENT: 37 minutes.   POSSIBLE D/C TOMORROW, DEPENDING ON CLINICAL CONDITION.   Gladstone Lighter M.D on 05/05/2017 at 2:18 PM  Between 7am to 6pm - Pager - [phone]  After 6pm go to [email or web address] - password [email or web address] [facility]  Office  [phone]  CC: Primary care physician; [name], MD

## 2017-05-05 NOTE — Care Management CC44 (Signed)
Condition Code 44 Documentation Completed  Patient Details  Name: Kendra [name] MRN: [id number] Date of Birth: 09/14/1951   Condition Code 44 given:    Patient signature on Condition Code 44 notice:    Documentation of 2 MD's agreement:   yes Code 44 added to claim:   yes    Beau Fanny, RN 05/05/2017, 1:52 PM

## 2017-05-05 NOTE — Progress Notes (Signed)
Post hd vitals 

## 2017-05-05 NOTE — Progress Notes (Signed)
Hd start 

## 2017-05-05 NOTE — Care Management Obs Status (Signed)
[facility] NOTIFICATION   Patient Details  Name: TYAN LASURE MRN: [id number] Date of Birth: 03/20/1951   Medicare Observation Status Notification Given:  Yes    Beverly Sessions, RN 05/05/2017, 3:34 PM

## 2017-05-05 NOTE — Progress Notes (Signed)
[facility]  ROUNDING NOTE   Subjective:  Patient seen at bedside. She will be due for hemodialysis later today. Eating breakfast at the moment.  Objective:  Vital signs in last 24 hours:  Temp:  [97.6 F (36.4 C)-98.5 F (36.9 C)] 98.5 F (36.9 C) (07/18 0823) Pulse Rate:  [84-100] 100 (07/18 0823) Resp:  [16-21] 18 (07/18 0823) BP: (145-154)/(72-82) 149/72 (07/18 0823) SpO2:  [93 %-100 %] 93 % (07/18 0823) Weight:  [105.4 kg (232 lb 6.4 oz)] 105.4 kg (232 lb 6.4 oz) (07/18 0500)  Weight change: 1.089 kg (2 lb 6.4 oz) Filed Weights   05/03/17 2221 05/05/17 0500  Weight: 104.3 kg (230 lb) 105.4 kg (232 lb 6.4 oz)    Intake/Output: I/O last 3 completed shifts: In: 250 [P.O.:250] Out: 100 [Urine:100]   Intake/Output this shift:  No intake/output data recorded.  Physical Exam: General: No acute distress  Head: Normocephalic, atraumatic. Moist oral mucosal membranes  Eyes: Anicteric  Neck: Supple, trachea midline  Lungs:  Clear to auscultation, normal effort  Heart: S1S2 no rubs  Abdomen:  Soft, nontender, bowel sounds present  Extremities: Trace peripheral edema.  Neurologic: Awake, alert, following commands  Skin: Herpes zoster left lateral chest  Access: LUE AVF    Basic Metabolic Panel:  Recent Labs Lab 05/02/17 0431 05/04/17 0135 05/04/17 0222 05/05/17 0343  NA 138 133*  --  133*  K 4.8 4.5  --  4.6  CL 99* 92*  --  93*  CO2 29 30  --  30  GLUCOSE 129* 198*  --  128*  BUN 62* 32*  --  46*  CREATININE 9.09* 6.47*  --  8.36*  CALCIUM 9.2 8.7*  --  8.4*  MG  --   --  2.2  --     Liver Function Tests:  Recent Labs Lab 05/04/17 0135  AST 40  ALT 25  ALKPHOS 49  BILITOT 1.0  PROT 8.0  ALBUMIN 4.1   No results for input(s): LIPASE, AMYLASE in the last 168 hours. No results for input(s): AMMONIA in the last 168 hours.  CBC:  Recent Labs Lab 05/02/17 0431 05/04/17 0222  WBC 6.3 4.9  HGB 12.2 12.1  HCT 35.3 35.7  MCV 99.8  97.5  PLT 250 265    Cardiac Enzymes:  Recent Labs Lab 05/04/17 0222  TROPONINI <0.03    BNP: Invalid input(s): POCBNP  CBG:  Recent Labs Lab 05/04/17 0817 05/04/17 1158 05/04/17 1652 05/04/17 2125 05/05/17 0747  GLUCAP 202* 179* 111* 194* 101*    Microbiology: Results for orders placed or performed during the hospital encounter of 05/04/17  Urine culture     Status: Abnormal   Collection Time: 05/02/17  4:31 AM  Result Value Ref Range Status   Specimen Description URINE, RANDOM  Final   Special Requests NONE  Final   Culture MULTIPLE SPECIES PRESENT, SUGGEST RECOLLECTION (A)  Final   Report Status 05/05/2017 FINAL  Final  MRSA PCR Screening     Status: None   Collection Time: 05/04/17  8:44 AM  Result Value Ref Range Status   MRSA by PCR NEGATIVE NEGATIVE Final    Comment:        The GeneXpert MRSA Assay (FDA approved for NASAL specimens only), is one component of a comprehensive MRSA colonization surveillance program. It is not intended to diagnose MRSA infection nor to guide or monitor treatment for MRSA infections.     Coagulation Studies: No results for input(s):  LABPROT, INR in the last 72 hours.  Urinalysis:  Recent Labs  05/04/17 0838  COLORURINE YELLOW*  LABSPEC 1.009  PHURINE 7.0  GLUCOSEU 150*  HGBUR SMALL*  BILIRUBINUR NEGATIVE  KETONESUR NEGATIVE  PROTEINUR 100*  NITRITE NEGATIVE  LEUKOCYTESUR SMALL*      Imaging: Ct Renal Stone Study  Result Date: 05/04/2017 CLINICAL DATA:  Left flank pain EXAM: CT ABDOMEN AND PELVIS WITHOUT CONTRAST TECHNIQUE: Multidetector CT imaging of the abdomen and pelvis was performed following the standard protocol without IV contrast. COMPARISON:  06/01/2013 ultrasound and 07/08/2015 ultrasound FINDINGS: Lower chest: Borderline cardiomegaly with coronary arteriosclerosis and aortic atherosclerosis. Small hiatal hernia. Clear lung bases apart from minimal bibasilar atelectasis. Hepatobiliary: Status  post cholecystectomy. The unenhanced liver is unremarkable. Pancreas: No ductal dilatation or definite mass of the pancreas. No peripancreatic inflammation. Spleen: No splenomegaly or mass. Adrenals/Urinary Tract: Normal bilateral adrenal glands. Nonspecific perinephric fat stranding which may be seen in urinary tract infections. No obstructive uropathy or focal mass. Nondistended appearing bladder with slight wall thickening likely due to underdistention. A cystitis is not excluded. Stomach/Bowel: Nondistended stomach. Normal small bowel rotation without obstruction. Moderate degree of fecal retention within large bowel query constipation. The appendix is not definitively identified. No findings of acute appendicitis however. Vascular/Lymphatic: Aortoiliac atherosclerosis without aneurysm. No lymphadenopathy by CT size criteria. Reproductive: Status post hysterectomy.  No adnexal mass. Other: Fat noted within both inguinal canals. Musculoskeletal: Thoracolumbar spondylosis. No lytic or blastic disease. IMPRESSION: 1. Bilateral nonspecific perinephric fat stranding which can be seen in urinary tract infections. No obstructive uropathy. Slightly thick-walled appearing bladder is likely due to underdistention but correlate to exclude a cystitis which could have a similar appearance. 2. Coronary arteriosclerosis and aortic atherosclerosis. 3. Status post cholecystectomy.  Status post hysterectomy. 4. Moderate fecal retention within large bowel, question constipation. Electronically Signed   By: [name] Royalty M.D.   On: 05/04/2017 02:49     Medications:   . ciprofloxacin     . atorvastatin  30 mg Oral Daily  . calcium acetate  1,334 mg Oral TID WC  . capsaicin   Topical BID  . clopidogrel  75 mg Oral Daily  . docusate sodium  100 mg Oral BID  . gabapentin  300 mg Oral BID  . heparin  5,000 Units Subcutaneous Q8H  . insulin aspart  0-15 Units Subcutaneous TID WC  . insulin glargine  16 Units Subcutaneous  QHS  . levothyroxine  150 mcg Oral QAC breakfast  . valACYclovir  1,000 mg Oral Q24H   acetaminophen **OR** acetaminophen, chlorpheniramine-HYDROcodone, furosemide, morphine injection, ondansetron **OR** ondansetron (ZOFRAN) IV, oxyCODONE, senna-docusate  Assessment/ Plan:  66 y.o. female with past medical history of end-stage renal disease on hemodialysis MWF, anemia chronic kidney disease, secondary hyperparathyroidism, hypertension, hypothyroidism, diabetes mellitus type 2, osteoarthritis, diabetic retinopathy who was admitted for pyelonephritis and also was found to have herpes zoster infection.  [facility]/MWF-2  1.  End-stage renal disease on hemodialysis MWF:  Patient due for dialysis at the bedside that she is on droplet precautions. Orders have been prepared.  2.  Anemia chronic kidney disease.  No indication for Procrit at the moment as hemoglobin was 12.1 at last check.  3.  Secondary hyperparathyroidism. Check intact PTH and phosphorus with dialysis today. Otherwise continue calcium acetate.  3.  Diabetes mellitus type 2 with chronic kidney disease.  Patient remains on long and short acting insulin. Management as per hospitalist.   LOS: 1 Erma Joubert 7/18/201811:08 AM

## 2017-05-05 NOTE — Progress Notes (Signed)
Pre hd info 

## 2017-05-06 ENCOUNTER — Observation Stay: Payer: Medicare Other

## 2017-05-06 DIAGNOSIS — N12 Tubulo-interstitial nephritis, not specified as acute or chronic: Secondary | ICD-10-CM | POA: Diagnosis not present

## 2017-05-06 LAB — GLUCOSE, CAPILLARY
Glucose-Capillary: 125 mg/dL — ABNORMAL HIGH (ref 65–99)
Glucose-Capillary: 156 mg/dL — ABNORMAL HIGH (ref 65–99)
Glucose-Capillary: 140 mg/dL — ABNORMAL HIGH (ref 65–99)
Glucose-Capillary: 194 mg/dL — ABNORMAL HIGH (ref 65–99)

## 2017-05-06 LAB — PARATHYROID HORMONE, INTACT (NO CA): PTH: 247 pg/mL — AB (ref 15–65)

## 2017-05-06 LAB — PHOSPHORUS: PHOSPHORUS: 3.4 mg/dL (ref 2.5–4.6)

## 2017-05-06 LAB — HEPATITIS B SURFACE ANTIGEN: HEP B S AG: NEGATIVE

## 2017-05-06 MED ORDER — SODIUM CHLORIDE 0.9 % IV BOLUS (SEPSIS): 500.0000 mL | Freq: Once | INTRAVENOUS | Status: DC

## 2017-05-06 MED ORDER — LACTULOSE 10 GM/15ML PO SOLN
20.0000 g | Freq: Two times a day (BID) | ORAL | Status: DC | PRN
  Administered 2017-05-06 – 2017-05-07 (×2): 20 g via ORAL
  Filled 2017-05-06 (×2): qty 30

## 2017-05-06 MED ORDER — POLYETHYLENE GLYCOL 3350 17 G PO PACK
17.0000 g | PACK | Freq: Every day | ORAL | Status: DC
  Administered 2017-05-06 – 2017-05-08 (×3): 17 g via ORAL
  Filled 2017-05-06 (×3): qty 1

## 2017-05-06 NOTE — Progress Notes (Signed)
[facility] at [place] NAME: Kendra [name]    MR#:  [id number]  DATE OF BIRTH:  [birth year]  SUBJECTIVE:  CHIEF COMPLAINT:   Chief Complaint  Patient presents with  . Back Pain   - More alert today. Complaints of constipation. -Had an episode of vomiting this afternoon. -Capsaicin cream causing burning on the rash  REVIEW OF SYSTEMS:  Review of Systems  Constitutional: Positive for malaise/fatigue. Negative for chills and fever.  HENT: Negative for congestion, ear discharge, hearing loss and nosebleeds.   Eyes: Negative for blurred vision and double vision.  Respiratory: Negative for cough, shortness of breath and wheezing.   Cardiovascular: Negative for chest pain, palpitations and leg swelling.  Gastrointestinal: Positive for constipation, nausea and vomiting. Negative for abdominal pain and diarrhea.  Genitourinary: Negative for dysuria.  Musculoskeletal: Positive for myalgias.  Skin: Positive for rash.  Neurological: Negative for dizziness, speech change, focal weakness, seizures and headaches.  Psychiatric/Behavioral: Negative for depression.    DRUG ALLERGIES:  No Known Allergies  VITALS:  Blood pressure 120/67, pulse 96, temperature 98.4 F (36.9 C), temperature source Oral, resp. rate 16, height 5\' 7"  (1.702 m), weight 105.1 kg (231 lb 12.8 oz), SpO2 99 %.  PHYSICAL EXAMINATION:  Physical Exam  GENERAL:  66 y.o.-year-old patient lying in the bed with no acute distress.  EYES: Pupils equal, round, reactive to light and accommodation. No scleral icterus. Extraocular muscles intact.  HEENT: Head atraumatic, normocephalic. Oropharynx and nasopharynx clear.  NECK:  Supple, no jugular venous distention. No thyroid enlargement, no tenderness.  LUNGS: Normal breath sounds bilaterally, no wheezing, rales,rhonchi or crepitation. No use of accessory muscles of respiration.  CARDIOVASCULAR: S1, S2 normal. No  rubs, or gallops. 2/6  systolic murmur present ABDOMEN: Soft, nontender, nondistended. Bowel sounds present. No organomegaly or mass.  EXTREMITIES: No pedal edema, cyanosis, or clubbing. Left upper arm AV fistula with good thrill. NEUROLOGIC: Cranial nerves II through XII are intact. Muscle strength 5/5 in all extremities. Sensation intact. Gait not checked.  PSYCHIATRIC: The patient is alert and oriented x 3.  SKIN: Cluster of erythematous lesions on the left upper back-which were vesicular in appearance yesterday are starting to scab off.   LABORATORY PANEL:   CBC  Recent Labs Lab 05/04/17 0222  WBC 4.9  HGB 12.1  HCT 35.7  PLT 265   ------------------------------------------------------------------------------------------------------------------  Chemistries   Recent Labs Lab 05/04/17 0135 05/04/17 0222 05/05/17 0343  NA 133*  --  133*  K 4.5  --  4.6  CL 92*  --  93*  CO2 30  --  30  GLUCOSE 198*  --  128*  BUN 32*  --  46*  CREATININE 6.47*  --  8.36*  CALCIUM 8.7*  --  8.4*  MG  --  2.2  --   AST 40  --   --   ALT 25  --   --   ALKPHOS 49  --   --   BILITOT 1.0  --   --    ------------------------------------------------------------------------------------------------------------------  Cardiac Enzymes  Recent Labs Lab 05/04/17 0222  TROPONINI <0.03   ------------------------------------------------------------------------------------------------------------------  RADIOLOGY:  Dg Abd 1 View  Result Date: 05/06/2017 CLINICAL DATA:  Nausea and vomiting. EXAM: ABDOMEN - 1 VIEW COMPARISON:  CT 05/04/2017 . FINDINGS: Surgical clips right upper quadrant . Soft tissue structures are unremarkable. No bowel distention. No free air. Degenerative changes thoracic and lumbar spine. Lumbar spine scoliosis. IMPRESSION: No  acute abnormality. Electronically Signed   By: Marcello Moores  Register   On: 05/06/2017 14:05    EKG:   Orders placed or performed during the hospital encounter of 05/04/17    . ED EKG  . ED EKG  . EKG 12-Lead  . EKG 12-Lead    ASSESSMENT AND PLAN:   66 year old female with past medical history significant for end-stage renal disease with Monday Wednesday Friday hemodialysis, diabetic retinopathy, neuropathy, hypertension, diabetes, osteoarthritis presents to hospital secondary to left-sided chest pain with new onset rash.  #1 acute cystitis-urine cultures with mixed organisms -Continue Cipro, renally dosed-Step after 5 days  #2 herpes zoster-was on IV acyclovir -Changed to oral Valtrex-renally dosed -Significant pain, pain meds added. Could not tolerate the capsaicin cream-so discontinue it Monitor -Lesions starting to scab - airborne isolation  #3 end-stage renal disease-on Monday Wednesday Friday hemodialysis-appreciate nephrology consult. -Dialysis tomorrow per schedule  #4 diabetes mellitus-continue Lantus and sliding scale insulin.  #5 confusion-acute encephalopathy. Could be from pain medications. Discontinue morphine, however patient has not used it. Discontinue Neurontin. Monitor on oxycodone. -Much improved after discontinuing gabapentin  #6 constipation-medications added. Could've caused the nausea and vomiting. Abdominal x-ray with no obstruction  #7 DVT prophylaxis-on subcutaneous heparin   Physical therapy consulted today due to weakness    All the records are reviewed and case discussed with Care Management/Social Workerr. Management plans discussed with the patient, family and they are in agreement.  CODE STATUS:  Full Code  TOTAL TIME TAKING CARE OF THIS PATIENT: 34 minutes.   POSSIBLE D/C TOMORROW, DEPENDING ON CLINICAL CONDITION.   Gladstone Lighter M.D on 05/06/2017 at 4:15 PM  Between 7am to 6pm - Pager - [phone]  After 6pm go to [email or web address] - password [email or web address] [facility]  Office  [phone]  CC: Primary care physician; [name], MD

## 2017-05-06 NOTE — Progress Notes (Signed)
[facility]  ROUNDING NOTE   Subjective:  Patient still having pain from shingles.  she underwent hemodialysis yesterday.   Objective:  Vital signs in last 24 hours:  Temp:  [98.5 F (36.9 C)-99.5 F (37.5 C)] 98.5 F (36.9 C) (07/19 0847) Pulse Rate:  [79-110] 84 (07/19 0847) Resp:  [14-20] 20 (07/19 0847) BP: (84-162)/(35-86) 131/70 (07/19 0847) SpO2:  [92 %-98 %] 93 % (07/19 0847) Weight:  [105.1 kg (231 lb 12.8 oz)-105.6 kg (232 lb 12.9 oz)] 105.1 kg (231 lb 12.8 oz) (07/19 0500)  Weight change: 0.184 kg (6.5 oz) Filed Weights   07/Kendra/Kendra 0500 07/Kendra/Kendra 1455 07/19/Kendra 0500  Weight: 105.4 kg (232 lb 6.4 oz) 105.6 kg (232 lb 12.9 oz) 105.1 kg (231 lb 12.8 oz)    Intake/Output: I/O last 3 completed shifts: In: 8 [P.O.:60] Out: 2100 [Urine:100; Other:2000]   Intake/Output this shift:  No intake/output data recorded.  Physical Exam: General: No acute distress  Head: Normocephalic, atraumatic. Moist oral mucosal membranes  Eyes: Anicteric  Neck: Supple, trachea midline  Lungs:  Clear to auscultation, normal effort  Heart: S1S2 no rubs  Abdomen:  Soft, nontender, bowel sounds present  Extremities: Trace peripheral edema.  Neurologic: Awake, alert, following commands  Skin: Herpes zoster left lateral chest  Access: LUE AVF    Basic Metabolic Panel:  Recent Labs Lab 07/15/Kendra 0431 07/17/Kendra 0135 07/17/Kendra 0222 07/Kendra/Kendra 0343 07/19/Kendra 0008  NA 138 133*  --  133*  --   K 4.8 4.5  --  4.6  --   CL 99* 92*  --  93*  --   CO2 29 30  --  30  --   GLUCOSE 129* 198*  --  128*  --   BUN 62* 32*  --  46*  --   CREATININE 9.09* 6.47*  --  8.36*  --   CALCIUM 9.2 8.7*  --  8.4*  --   MG  --   --  2.2  --   --   PHOS  --   --   --   --  3.4    Liver Function Tests:  Recent Labs Lab 07/17/Kendra 0135  AST 40  ALT 25  ALKPHOS 49  BILITOT 1.0  PROT 8.0  ALBUMIN 4.1   No results for input(s): LIPASE, AMYLASE in the last 168 hours. No results for  input(s): AMMONIA in the last 168 hours.  CBC:  Recent Labs Lab 07/15/Kendra 0431 07/17/Kendra 0222  WBC 6.3 4.9  HGB 12.2 12.1  HCT 35.3 35.7  MCV 99.8 97.5  PLT 250 265    Cardiac Enzymes:  Recent Labs Lab 07/17/Kendra 0222  TROPONINI <0.03    BNP: Invalid input(s): POCBNP  CBG:  Recent Labs Lab 07/Kendra/Kendra 0747 07/Kendra/Kendra 1130 07/Kendra/Kendra 2136 07/19/Kendra 0746 07/19/Kendra 1142  GLUCAP 101* 154* 267* 125* 156*    Microbiology: Results for orders placed or performed during the hospital encounter of 07/17/Kendra  Urine culture     Status: Abnormal   Collection Time: 07/15/Kendra  4:31 AM  Result Value Ref Range Status   Specimen Description URINE, RANDOM  Final   Special Requests NONE  Final   Culture MULTIPLE SPECIES PRESENT, SUGGEST RECOLLECTION (A)  Final   Report Status 07/Kendra/2018 FINAL  Final  MRSA PCR Screening     Status: None   Collection Time: 07/17/Kendra  8:44 AM  Result Value Ref Range Status   MRSA by PCR NEGATIVE NEGATIVE Final    Comment:  The GeneXpert MRSA Assay (FDA approved for NASAL specimens only), is one component of a comprehensive MRSA colonization surveillance program. It is not intended to diagnose MRSA infection nor to guide or monitor treatment for MRSA infections.     Coagulation Studies: No results for input(s): LABPROT, INR in the last 72 hours.  Urinalysis:  Recent Labs  07/17/Kendra 0838  COLORURINE YELLOW*  LABSPEC 1.009  PHURINE 7.0  GLUCOSEU 150*  HGBUR SMALL*  BILIRUBINUR NEGATIVE  KETONESUR NEGATIVE  PROTEINUR 100*  NITRITE NEGATIVE  LEUKOCYTESUR SMALL*      Imaging: No results found.   Medications:   . sodium chloride     . atorvastatin  30 mg Oral Daily  . calcium acetate  1,334 mg Oral TID WC  . capsaicin   Topical BID  . ciprofloxacin  500 mg Oral Q24H  . clopidogrel  75 mg Oral Daily  . docusate sodium  100 mg Oral BID  . heparin  5,000 Units Subcutaneous Q8H  . insulin aspart  0-15 Units Subcutaneous TID WC   . insulin glargine  16 Units Subcutaneous QHS  . levothyroxine  150 mcg Oral QAC breakfast  . valACYclovir  1,000 mg Oral Q24H   acetaminophen **OR** acetaminophen, furosemide, ondansetron **OR** ondansetron (ZOFRAN) IV, oxyCODONE, senna-docusate  Assessment/ Plan:  66 y.o. Kendra [name] with past medical history of end-stage renal disease on hemodialysis MWF, anemia chronic kidney disease, secondary hyperparathyroidism, hypertension, hypothyroidism, diabetes mellitus type 2, osteoarthritis, diabetic retinopathy who was admitted for pyelonephritis and also was found to have herpes zoster infection.  [facility]/M[facility]-2  1.  End-stage renal disease on hemodialysis MWF:  Patient completed hemodialysis yesterday. No acute indication for dialysis today. We will plan for dialysis again tomorrow.  2.  Anemia chronic kidney disease.  Continue to hold Epogen as most recent hemoglobin was 12.1.  3.  Secondary hyperparathyroidism. Phosphorus at target at 3.4. Continue calcium acetate.  3.  Diabetes mellitus type 2 with chronic kidney disease.  Patient remains on long and short acting insulin. Management as per hospitalist.   LOS: 1 Terryl Niziolek 7/19/201812:03 PM

## 2017-05-07 ENCOUNTER — Observation Stay: Payer: Medicare Other

## 2017-05-07 DIAGNOSIS — N12 Tubulo-interstitial nephritis, not specified as acute or chronic: Secondary | ICD-10-CM | POA: Diagnosis not present

## 2017-05-07 LAB — GLUCOSE, CAPILLARY
Glucose-Capillary: 80 mg/dL (ref 65–99)
Glucose-Capillary: 168 mg/dL — ABNORMAL HIGH (ref 65–99)
Glucose-Capillary: 214 mg/dL — ABNORMAL HIGH (ref 65–99)
Glucose-Capillary: 120 mg/dL — ABNORMAL HIGH (ref 65–99)

## 2017-05-07 LAB — BASIC METABOLIC PANEL
Anion gap: 15 (ref 5–15)
BUN: 52 mg/dL — ABNORMAL HIGH (ref 6–20)
CHLORIDE: 93 mmol/L — AB (ref 101–111)
CO2: 29 mmol/L (ref 22–32)
Calcium: 8.4 mg/dL — ABNORMAL LOW (ref 8.9–10.3)
Creatinine, Ser: 8.79 mg/dL — ABNORMAL HIGH (ref 0.44–1.00)
GFR calc non Af Amer: 4 mL/min — ABNORMAL LOW (ref >60)
GFR, EST AFRICAN AMERICAN: 5 mL/min — AB (ref >60)
Glucose, Bld: 86 mg/dL (ref 65–99)
POTASSIUM: 4.2 mmol/L (ref 3.5–5.1)
SODIUM: 137 mmol/L (ref 135–145)

## 2017-05-07 MED ORDER — INSULIN DETEMIR 100 UNIT/ML ~~LOC~~ SOLN: 16.0000 [IU] | Freq: Every day | SUBCUTANEOUS | 11 refills | Status: DC

## 2017-05-07 MED ORDER — OXYCODONE HCL 5 MG PO TABS: 5.0000 mg | ORAL_TABLET | Freq: Four times a day (QID) | ORAL | 0 refills | Status: DC | PRN

## 2017-05-07 MED ORDER — BISACODYL 10 MG RE SUPP
10.0000 mg | RECTAL | Status: AC
  Administered 2017-05-07: 10 mg via RECTAL
  Filled 2017-05-07: qty 1

## 2017-05-07 MED ORDER — POLYETHYLENE GLYCOL 3350 17 G PO PACK: 17.0000 g | PACK | Freq: Every day | ORAL | 0 refills | Status: AC

## 2017-05-07 MED ORDER — FLEET ENEMA 7-19 GM/118ML RE ENEM
1.0000 | ENEMA | Freq: Every day | RECTAL | Status: DC | PRN
  Administered 2017-05-07: 1 via RECTAL
  Filled 2017-05-07: qty 1

## 2017-05-07 MED ORDER — ASPIRIN EC 81 MG PO TBEC
81.0000 mg | DELAYED_RELEASE_TABLET | Freq: Every day | ORAL | Status: DC
  Administered 2017-05-07 – 2017-05-08 (×2): 81 mg via ORAL
  Filled 2017-05-07 (×2): qty 1

## 2017-05-07 MED ORDER — CIPROFLOXACIN HCL 500 MG PO TABS: 500.0000 mg | ORAL_TABLET | ORAL | 0 refills | Status: DC

## 2017-05-07 MED ORDER — VALACYCLOVIR HCL 1 G PO TABS: 1000.0000 mg | ORAL_TABLET | ORAL | 0 refills | Status: DC

## 2017-05-07 NOTE — Evaluation (Signed)
Physical Therapy Evaluation Patient Details Name: Kendra [name] MRN: [id number] DOB: 1950/11/26 Today's Date: 05/07/2017   History of Present Illness  Pt is a 66 year old female with past medical history significant for end-stage renal disease with Monday Wednesday Friday hemodialysis, diabetic retinopathy, neuropathy, hypertension, diabetes, osteoarthritis presents to hospital secondary to left-sided chest pain with new onset rash.  Assessment includes: acute cystitis, herpes zoster, ESRD, DM, acute encephalopathy, and constipation.    Clinical Impression  Pt presents with minor deficits in strength, transfers, gait, and balance and more moderate deficits in activity tolerance.  Pt Ind with all bed mobility tasks and SBA with transfers.  Pt able to amb approximately 60' in room with RW and SBA prior to requiring seated rest break secondary to fatigue.  No DOE or other symptoms noted during session.  Pt will benefit from HHPT services upon discharge to address above deficits for decreased caregiver assistance and return to PLOF.      Follow Up Recommendations Home health PT    Equipment Recommendations  None recommended by PT    Recommendations for Other Services       Precautions / Restrictions Precautions Precautions: Fall Restrictions Weight Bearing Restrictions: No      Mobility  Bed Mobility Overal bed mobility: Independent                Transfers Overall transfer level: Needs assistance Equipment used: Rolling walker (2 wheeled) Transfers: Sit to/from Stand Sit to Stand: Supervision         General transfer comment: Good functional strength with standing from EOB  Ambulation/Gait Ambulation/Gait assistance: Supervision Ambulation Distance (Feet): 60 Feet Assistive device: Rolling walker (2 wheeled) Gait Pattern/deviations: Step-through pattern;Decreased step length - right;Decreased step length - left   Gait velocity interpretation: Below normal  speed for age/gender General Gait Details: Amb in room only secondary to airborn precautions; pt steady with RW without LOB but did fatigue quicker than baseline for pt  Stairs            Wheelchair Mobility    Modified Rankin (Stroke Patients Only)       Balance Overall balance assessment: Needs assistance Sitting-balance support: No upper extremity supported Sitting balance-Leahy Scale: Normal     Standing balance support: No upper extremity supported Standing balance-Leahy Scale: Good                               Pertinent Vitals/Pain Pain Assessment: 0-10 Pain Score: 6  Pain Location: back and chest from shingles Pain Descriptors / Indicators: Burning;Aching Pain Intervention(s): Premedicated before session;Monitored during session    Home Living Family/patient expects to be discharged to:: Private residence Living Arrangements: Alone Available Help at Discharge: Friend(s);Available PRN/intermittently Type of Home: Apartment Home Access: Level entry     Home Layout: One level Home Equipment: Walker - 2 wheels      Prior Function Level of Independence: Independent         Comments: Ind Amb without AD community distances, no fall history, Ind with all ADLs     Hand Dominance   Dominant Hand: Right    Extremity/Trunk Assessment   Upper Extremity Assessment Upper Extremity Assessment: Overall WFL for tasks assessed    Lower Extremity Assessment Lower Extremity Assessment: Overall WFL for tasks assessed       Communication   Communication: No difficulties  Cognition Arousal/Alertness: Awake/alert Behavior During Therapy: WFL for tasks assessed/performed Overall Cognitive Status:  Within Functional Limits for tasks assessed                                        General Comments      Exercises Total Joint Exercises Ankle Circles/Pumps: AROM;Both;10 reps Quad Sets: Strengthening;Both;10 reps Gluteal Sets:  Strengthening;Both;10 reps Hip ABduction/ADduction: AROM;Both;10 reps Straight Leg Raises: AROM;Both;10 reps Long Arc Quad: AROM;Both;10 reps Marching in Standing: AROM;Both;10 reps   Assessment/Plan    PT Assessment Patient needs continued PT services  PT Problem List Decreased activity tolerance;Decreased strength;Decreased balance       PT Treatment Interventions Gait training;Neuromuscular re-education;Balance training;Therapeutic exercise;Therapeutic activities;Patient/family education    PT Goals (Current goals can be found in the Care Plan section)  Acute Rehab PT Goals Patient Stated Goal: To get stronger PT Goal Formulation: With patient Time For Goal Achievement: 05/20/17 Potential to Achieve Goals: Good    Frequency Min 2X/week   Barriers to discharge        Co-evaluation               AM-PAC PT "6 Clicks" Daily Activity  Outcome Measure Difficulty turning over in bed (including adjusting bedclothes, sheets and blankets)?: None Difficulty moving from lying on back to sitting on the side of the bed? : None Difficulty sitting down on and standing up from a chair with arms (e.g., wheelchair, bedside commode, etc,.)?: None Help needed moving to and from a bed to chair (including a wheelchair)?: A Little Help needed walking in hospital room?: A Little Help needed climbing 3-5 steps with a railing? : A Lot 6 Click Score: 20    End of Session Equipment Utilized During Treatment: Gait belt Activity Tolerance: Patient limited by fatigue Patient left: in bed;with bed alarm set;with call bell/phone within reach   PT Visit Diagnosis: Muscle weakness (generalized) (M62.81)    Time: 6378-5885 PT Time Calculation (min) (ACUTE ONLY): 26 min   Charges:   PT Evaluation $PT Eval Low Complexity: 1 Procedure PT Treatments $Therapeutic Exercise: 8-22 mins   PT G Codes:   PT G-Codes **NOT FOR INPATIENT CLASS** Functional Assessment Tool Used: AM-PAC 6 Clicks Basic  Mobility Functional Limitation: Mobility: Walking and moving around Mobility: Walking and Moving Around Current Status ([id number]): At least 20 percent but less than 40 percent impaired, limited or restricted Mobility: Walking and Moving Around Goal Status [phone]): 0 percent impaired, limited or restricted    D. Scott Dasha Kawabata PT, DPT 05/07/17, 11:39 AM

## 2017-05-07 NOTE — Progress Notes (Signed)
[facility]  ROUNDING NOTE   Subjective:  Patient seen at bedside. She reports that her pain from her shingles is improved.  she is due for hemodialysis today.   Objective:  Vital signs in last 24 hours:  Temp:  [98.1 F (36.7 C)-98.4 F (36.9 C)] 98.1 F (36.7 C) (07/20 0510) Pulse Rate:  [76-96] 76 (07/20 0510) Resp:  [16-20] 18 (07/20 0510) BP: (119-127)/(59-68) 119/59 (07/20 0510) SpO2:  [94 %-99 %] 94 % (07/20 0510) Weight:  [106 kg (233 lb 11.2 oz)] 106 kg (233 lb 11.2 oz) (07/20 0510)  Weight change: 0.406 kg (14.3 oz) Filed Weights   05/05/17 1455 05/06/17 0500 05/07/17 0510  Weight: 105.6 kg (232 lb 12.9 oz) 105.1 kg (231 lb 12.8 oz) 106 kg (233 lb 11.2 oz)    Intake/Output: I/O last 3 completed shifts: In: 57 [P.O.:60] Out: 100 [Urine:100]   Intake/Output this shift:  No intake/output data recorded.  Physical Exam: General: No acute distress  Head: Normocephalic, atraumatic. Moist oral mucosal membranes  Eyes: Anicteric  Neck: Supple, trachea midline  Lungs:  Clear to auscultation, normal effort  Heart: S1S2 no rubs  Abdomen:  Soft, nontender, bowel sounds present  Extremities: Trace peripheral edema.  Neurologic: Awake, alert, following commands  Skin: Herpes zoster left lateral chest  Access: LUE AVF    Basic Metabolic Panel:  Recent Labs Lab 05/02/17 0431 05/04/17 0135 05/04/17 0222 05/05/17 0343 05/06/17 0008 05/07/17 0411  NA 138 133*  --  133*  --  137  K 4.8 4.5  --  4.6  --  4.2  CL 99* 92*  --  93*  --  93*  CO2 29 30  --  30  --  29  GLUCOSE 129* 198*  --  128*  --  86  BUN 62* 32*  --  46*  --  52*  CREATININE 9.09* 6.47*  --  8.36*  --  8.79*  CALCIUM 9.2 8.7*  --  8.4*  --  8.4*  MG  --   --  2.2  --   --   --   PHOS  --   --   --   --  3.4  --     Liver Function Tests:  Recent Labs Lab 05/04/17 0135  AST 40  ALT 25  ALKPHOS 49  BILITOT 1.0  PROT 8.0  ALBUMIN 4.1   No results for input(s): LIPASE,  AMYLASE in the last 168 hours. No results for input(s): AMMONIA in the last 168 hours.  CBC:  Recent Labs Lab 05/02/17 0431 05/04/17 0222  WBC 6.3 4.9  HGB 12.2 12.1  HCT 35.3 35.7  MCV 99.8 97.5  PLT 250 265    Cardiac Enzymes:  Recent Labs Lab 05/04/17 0222  TROPONINI <0.03    BNP: Invalid input(s): POCBNP  CBG:  Recent Labs Lab 05/06/17 0746 05/06/17 1142 05/06/17 1655 05/06/17 2043 05/07/17 0743  GLUCAP 125* 156* 140* 194* 56    Microbiology: Results for orders placed or performed during the hospital encounter of 05/04/17  Urine culture     Status: Abnormal   Collection Time: 05/02/17  4:31 AM  Result Value Ref Range Status   Specimen Description URINE, RANDOM  Final   Special Requests NONE  Final   Culture MULTIPLE SPECIES PRESENT, SUGGEST RECOLLECTION (A)  Final   Report Status 05/05/2017 FINAL  Final  MRSA PCR Screening     Status: None   Collection Time: 05/04/17  8:44  AM  Result Value Ref Range Status   MRSA by PCR NEGATIVE NEGATIVE Final    Comment:        The GeneXpert MRSA Assay (FDA approved for NASAL specimens only), is one component of a comprehensive MRSA colonization surveillance program. It is not intended to diagnose MRSA infection nor to guide or monitor treatment for MRSA infections.     Coagulation Studies: No results for input(s): LABPROT, INR in the last 72 hours.  Urinalysis: No results for input(s): COLORURINE, LABSPEC, PHURINE, GLUCOSEU, HGBUR, BILIRUBINUR, KETONESUR, PROTEINUR, UROBILINOGEN, NITRITE, LEUKOCYTESUR in the last 72 hours.  Invalid input(s): APPERANCEUR    Imaging: Dg Abd 1 View  Result Date: 05/06/2017 CLINICAL DATA:  Nausea and vomiting. EXAM: ABDOMEN - 1 VIEW COMPARISON:  CT 05/04/2017 . FINDINGS: Surgical clips right upper quadrant . Soft tissue structures are unremarkable. No bowel distention. No free air. Degenerative changes thoracic and lumbar spine. Lumbar spine scoliosis. IMPRESSION: No  acute abnormality. Electronically Signed   By: Marcello Moores  Register   On: 05/06/2017 14:05     Medications:   . sodium chloride     . atorvastatin  30 mg Oral Daily  . bisacodyl  10 mg Rectal STAT  . calcium acetate  1,334 mg Oral TID WC  . ciprofloxacin  500 mg Oral Q24H  . clopidogrel  75 mg Oral Daily  . docusate sodium  100 mg Oral BID  . heparin  5,000 Units Subcutaneous Q8H  . insulin aspart  0-15 Units Subcutaneous TID WC  . insulin glargine  16 Units Subcutaneous QHS  . levothyroxine  150 mcg Oral QAC breakfast  . polyethylene glycol  17 g Oral Daily  . valACYclovir  1,000 mg Oral Q24H   acetaminophen **OR** acetaminophen, furosemide, lactulose, ondansetron **OR** ondansetron (ZOFRAN) IV, oxyCODONE, senna-docusate, sodium phosphate  Assessment/ Plan:  66 y.o. female with past medical history of end-stage renal disease on hemodialysis MWF, anemia chronic kidney disease, secondary hyperparathyroidism, hypertension, hypothyroidism, diabetes mellitus type 2, osteoarthritis, diabetic retinopathy who was admitted for pyelonephritis and also was found to have herpes zoster infection.  [facility]-2  1.  End-stage renal disease on hemodialysis MWF:  Dialysis as per her usual schedule today. Orders have been prepared.  2.  Anemia chronic kidney disease.  Epogen has been on hold this hospitalization given hemoglobin of 12.1 most recently.  3.  Secondary hyperparathyroidism. Continue calcium acetate and follow-up serum phosphorus today.  4.  Diabetes mellitus type 2 with chronic kidney disease.  Patient remains on long and short acting insulin. Management as per hospitalist.  5. Herpes zoster infection. Patient on valacyclovir 1 g by mouth every 24 hours.   LOS: 1 Mellissa Conley 7/20/201811:34 AM

## 2017-05-07 NOTE — Care Management (Signed)
Patient admitted for acute cystitis.  Patient lives at home alone at the [place] independent living.  Sister lives locally for support.  Patient is a chronic HD patient on MWF.  PT has assessed patient and recommended home health PT.  Patient has a RW in the home for ambulation.  Patient was offered home health agency preference. Patient states that she does not have an agency preference. Heads up referral

## 2017-05-07 NOTE — Progress Notes (Signed)
[place] [facility] [place] NAME: Kendra [name]    MR#:  [id number]  DATE OF BIRTH:  07/15/1951  SUBJECTIVE:  CHIEF COMPLAINT:   Chief Complaint  Patient presents with  . Back Pain   - Still has constipation. No nausea or vomiting today. -For dialysis today. -Complains of diplopia of the left eye  REVIEW OF SYSTEMS:  Review of Systems  Constitutional: Positive for malaise/fatigue. Negative for chills and fever.  HENT: Negative for congestion, ear discharge, hearing loss and nosebleeds.   Eyes: Positive for double vision. Negative for blurred vision.  Respiratory: Negative for cough, shortness of breath and wheezing.   Cardiovascular: Negative for chest pain, palpitations and leg swelling.  Gastrointestinal: Positive for constipation. Negative for abdominal pain, diarrhea, nausea and vomiting.  Genitourinary: Negative for dysuria.  Musculoskeletal: Positive for myalgias.  Skin: Positive for rash.  Neurological: Negative for dizziness, speech change, focal weakness, seizures and headaches.  Psychiatric/Behavioral: Negative for depression.    DRUG ALLERGIES:  No Known Allergies  VITALS:  Blood pressure (!) 141/64, pulse 100, temperature 97.9 F (36.6 C), temperature source Oral, resp. rate 20, height 5\' 7"  (1.702 m), weight 106 kg (233 lb 11.2 oz), SpO2 96 %.  PHYSICAL EXAMINATION:  Physical Exam  GENERAL:  66 y.o.-year-old patient lying in the bed with no acute distress.  EYES: Pupils equal, round, reactive to light and accommodation. No scleral icterus. Extraocular muscles intact.  HEENT: Head atraumatic, normocephalic. Oropharynx and nasopharynx clear.  NECK:  Supple, no jugular venous distention. No thyroid enlargement, no tenderness.  LUNGS: Normal breath sounds bilaterally, no wheezing, rales,rhonchi or crepitation. No use of accessory muscles of respiration.  CARDIOVASCULAR: S1, S2 normal. No  rubs, or gallops. 2/6 systolic  murmur present ABDOMEN: Soft, nontender, nondistended. Bowel sounds present. No organomegaly or mass.  EXTREMITIES: No pedal edema, cyanosis, or clubbing. Left upper arm AV fistula with good thrill. NEUROLOGIC: Cranial nerves II through XII are intact. Muscle strength 5/5 in all extremities. Sensation intact. Gait not checked.  PSYCHIATRIC: The patient is alert and oriented x 3.  SKIN: Cluster of erythematous lesions on the left upper back- are starting to scab off.   LABORATORY PANEL:   CBC  Recent Labs Lab 05/04/17 0222  WBC 4.9  HGB 12.1  HCT 35.7  PLT 265   ------------------------------------------------------------------------------------------------------------------  Chemistries   Recent Labs Lab 05/04/17 0135 05/04/17 0222  05/07/17 0411  NA 133*  --   < > 137  K 4.5  --   < > 4.2  CL 92*  --   < > 93*  CO2 30  --   < > 29  GLUCOSE 198*  --   < > 86  BUN 32*  --   < > 52*  CREATININE 6.47*  --   < > 8.79*  CALCIUM 8.7*  --   < > 8.4*  MG  --  2.2  --   --   AST 40  --   --   --   ALT 25  --   --   --   ALKPHOS 49  --   --   --   BILITOT 1.0  --   --   --   < > = values in this interval not displayed. ------------------------------------------------------------------------------------------------------------------  Cardiac Enzymes  Recent Labs Lab 05/04/17 0222  TROPONINI <0.03   ------------------------------------------------------------------------------------------------------------------  RADIOLOGY:  Dg Abd 1 View  Result Date: 05/06/2017 CLINICAL DATA:  Nausea and vomiting. EXAM: ABDOMEN - 1 VIEW COMPARISON:  CT 05/04/2017 . FINDINGS: Surgical clips right upper quadrant . Soft tissue structures are unremarkable. No bowel distention. No free air. Degenerative changes thoracic and lumbar spine. Lumbar spine scoliosis. IMPRESSION: No acute abnormality. Electronically Signed   By: Marcello Moores  Register   On: 05/06/2017 14:05    EKG:   Orders placed  or performed during the hospital encounter of 05/04/17  . ED EKG  . ED EKG  . EKG 12-Lead  . EKG 12-Lead    ASSESSMENT AND PLAN:   66 year old female with past medical history significant for end-stage renal disease with Monday Wednesday Friday hemodialysis, diabetic retinopathy, neuropathy, hypertension, diabetes, osteoarthritis presents to hospital secondary to left-sided chest pain with new onset rash.  #1 acute cystitis-urine cultures with mixed organisms -Continue Cipro, renally dosed-Step after 5 days  #2 herpes zoster-was on IV acyclovir -Changed to oral Valtrex-renally dosed -Significant pain, pain meds added. Could not tolerate the capsaicin cream-so discontinue it Monitor -Lesions starting to scab - airborne isolation  #3 end-stage renal disease-on Monday Wednesday Friday hemodialysis-appreciate nephrology consult. -Dialysis today per schedule  #4 diabetes mellitus-continue Lantus and sliding scale insulin.  #5 Diplopia- new onset, already on plavix, add aspirin. On statin - MRI brain ordered- neuro consult if positive  #6 DVT prophylaxis-on subcutaneous heparin   Physical therapy consulted Discharge if MRI negative   All the records are reviewed and case discussed with Care Management/Social Workerr. Management plans discussed with the patient, family and they are in agreement.  CODE STATUS:  Full Code  TOTAL TIME TAKING CARE OF THIS PATIENT: 37 minutes.   POSSIBLE D/C TOMORROW, DEPENDING ON CLINICAL CONDITION.   Miranda Garber M.D on 05/07/2017 at 2:31 PM  Between 7am to 6pm - Pager - [phone]  After 6pm go to [email or web address] - password [email or web address] [facility]  Office  [phone]  CC: Primary care physician; [name], MD

## 2017-05-07 NOTE — Progress Notes (Signed)
Although patient was educated about buttocks skin breakdown related to bedpan usage, patient refused to get off the bedpan at appropriate time due to dialysis connection.

## 2017-05-07 NOTE — Progress Notes (Signed)
Pharmacy Antibiotic Note  Kendra [name] is a 66 y.o. female admitted on 05/04/2017 with pyelonephritis/vesicles.  Pharmacy has been consulted for ciprofloxacin dosing.  Plan: Continue Cipro 500mg  daily, stop date 7/22 per MD.    Height: 5\' 7"  (170.2 cm) Weight: 233 lb 11.2 oz (106 kg) IBW/kg (Calculated) : 61.6  Temp (24hrs), Avg:98.1 F (36.7 C), Min:97.9 F (36.6 C), Max:98.3 F (36.8 C)   Recent Labs Lab 05/02/17 0431 05/04/17 0135 05/04/17 0222 05/05/17 0343 05/07/17 0411  WBC 6.3  --  4.9  --   --   CREATININE 9.09* 6.47*  --  8.36* 8.79*  LATICACIDVEN  --   --  1.3  --   --     Estimated Creatinine Clearance: 7.8 mL/min (A) (by C-G formula based on SCr of 8.79 mg/dL (H)).    No Known Allergies  Thank you for allowing pharmacy to be a part of this patient's care.  Pernell Dupre, PharmD, BCPS Clinical Pharmacist 05/07/2017 1:44 PM

## 2017-05-08 DIAGNOSIS — N12 Tubulo-interstitial nephritis, not specified as acute or chronic: Secondary | ICD-10-CM | POA: Diagnosis not present

## 2017-05-08 LAB — CBC
HCT: 29.7 % — ABNORMAL LOW (ref 35.0–47.0)
Hemoglobin: 10.3 g/dL — ABNORMAL LOW (ref 12.0–16.0)
MCH: 33.9 pg (ref 26.0–34.0)
MCHC: 34.6 g/dL (ref 32.0–36.0)
MCV: 98 fL (ref 80.0–100.0)
PLATELETS: 198 10*3/uL (ref 150–440)
RBC: 3.03 MIL/uL — ABNORMAL LOW (ref 3.80–5.20)
RDW: 12.9 % (ref 11.5–14.5)
WBC: 5.7 10*3/uL (ref 3.6–11.0)

## 2017-05-08 LAB — GLUCOSE, CAPILLARY
Glucose-Capillary: 76 mg/dL (ref 65–99)
Glucose-Capillary: 137 mg/dL — ABNORMAL HIGH (ref 65–99)

## 2017-05-08 MED ORDER — OXYCODONE HCL 5 MG PO TABS: 5.0000 mg | ORAL_TABLET | Freq: Four times a day (QID) | ORAL | 0 refills | Status: DC | PRN

## 2017-05-08 NOTE — Discharge Instructions (Signed)
Follow all MD discharge instructions. Take all medications as prescribed. Keep all follow up appointments. If your symptoms return, call your doctor. If you experience any new symptoms that are of concern to you or that are bothersome to you, call your doctor. For all questions and/or concerns, call your doctor. ° ° If you have a medical emergency, call 911 ° ° ° °

## 2017-05-08 NOTE — Progress Notes (Signed)
[facility]  ROUNDING NOTE   Subjective:  Shingles appears to be improving. Pain decreased. Patient had hemodialysis yesterday.   Objective:  Vital signs in last 24 hours:  Temp:  [97.9 F (36.6 C)-98.6 F (37 C)] 98.6 F (37 C) (07/21 0438) Pulse Rate:  [78-104] 78 (07/21 0438) Resp:  [20] 20 (07/21 0438) BP: (112-157)/(54-83) 112/54 (07/21 0438) SpO2:  [96 %-100 %] 97 % (07/21 0438) Weight:  [108.8 kg (239 lb 13.8 oz)] 108.8 kg (239 lb 13.8 oz) (07/20 1745)  Weight change: 2.794 kg (6 lb 2.6 oz) Filed Weights   05/06/17 0500 05/07/17 0510 05/07/17 1745  Weight: 105.1 kg (231 lb 12.8 oz) 106 kg (233 lb 11.2 oz) 108.8 kg (239 lb 13.8 oz)    Intake/Output: No intake/output data recorded.   Intake/Output this shift:  No intake/output data recorded.  Physical Exam: General: No acute distress  Head: Normocephalic, atraumatic. Moist oral mucosal membranes  Eyes: Anicteric  Neck: Supple, trachea midline  Lungs:  Clear to auscultation, normal effort  Heart: S1S2 no rubs  Abdomen:  Soft, nontender, bowel sounds present  Extremities: Trace peripheral edema.  Neurologic: Awake, alert, following commands  Skin: Herpes zoster left lateral chest  Access: LUE AVF    Basic Metabolic Panel:  Recent Labs Lab 05/02/17 0431 05/04/17 0135 05/04/17 0222 05/05/17 0343 05/06/17 0008 05/07/17 0411  NA 138 133*  --  133*  --  137  K 4.8 4.5  --  4.6  --  4.2  CL 99* 92*  --  93*  --  93*  CO2 29 30  --  30  --  29  GLUCOSE 129* 198*  --  128*  --  86  BUN 62* 32*  --  46*  --  52*  CREATININE 9.09* 6.47*  --  8.36*  --  8.79*  CALCIUM 9.2 8.7*  --  8.4*  --  8.4*  MG  --   --  2.2  --   --   --   PHOS  --   --   --   --  3.4  --     Liver Function Tests:  Recent Labs Lab 05/04/17 0135  AST 40  ALT 25  ALKPHOS 49  BILITOT 1.0  PROT 8.0  ALBUMIN 4.1   No results for input(s): LIPASE, AMYLASE in the last 168 hours. No results for input(s): AMMONIA  in the last 168 hours.  CBC:  Recent Labs Lab 05/02/17 0431 05/04/17 0222 05/08/17 0550  WBC 6.3 4.9 5.7  HGB 12.2 12.1 10.3*  HCT 35.3 35.7 29.7*  MCV 99.8 97.5 98.0  PLT 250 265 198    Cardiac Enzymes:  Recent Labs Lab 05/04/17 0222  TROPONINI <0.03    BNP: Invalid input(s): POCBNP  CBG:  Recent Labs Lab 05/07/17 0743 05/07/17 1135 05/07/17 1722 05/07/17 2123 05/08/17 0804  GLUCAP 80 168* 214* 120* 65    Microbiology: Results for orders placed or performed during the hospital encounter of 05/04/17  Urine culture     Status: Abnormal   Collection Time: 05/02/17  4:31 AM  Result Value Ref Range Status   Specimen Description URINE, RANDOM  Final   Special Requests NONE  Final   Culture MULTIPLE SPECIES PRESENT, SUGGEST RECOLLECTION (A)  Final   Report Status 05/05/2017 FINAL  Final  MRSA PCR Screening     Status: None   Collection Time: 05/04/17  8:44 AM  Result Value Ref Range Status  MRSA by PCR NEGATIVE NEGATIVE Final    Comment:        The GeneXpert MRSA Assay (FDA approved for NASAL specimens only), is one component of a comprehensive MRSA colonization surveillance program. It is not intended to diagnose MRSA infection nor to guide or monitor treatment for MRSA infections.     Coagulation Studies: No results for input(s): LABPROT, INR in the last 72 hours.  Urinalysis: No results for input(s): COLORURINE, LABSPEC, PHURINE, GLUCOSEU, HGBUR, BILIRUBINUR, KETONESUR, PROTEINUR, UROBILINOGEN, NITRITE, LEUKOCYTESUR in the last 72 hours.  Invalid input(s): APPERANCEUR    Imaging: Dg Abd 1 View  Result Date: 05/06/2017 CLINICAL DATA:  Nausea and vomiting. EXAM: ABDOMEN - 1 VIEW COMPARISON:  CT 05/04/2017 . FINDINGS: Surgical clips right upper quadrant . Soft tissue structures are unremarkable. No bowel distention. No free air. Degenerative changes thoracic and lumbar spine. Lumbar spine scoliosis. IMPRESSION: No acute abnormality.  Electronically Signed   By: Marcello Moores  Register   On: 05/06/2017 14:05   Mr Brain Wo Contrast  Result Date: 05/07/2017 CLINICAL DATA:  Initial evaluation for acute diplopia, evaluate for stroke. History of end-stage renal disease. EXAM: MRI HEAD WITHOUT CONTRAST TECHNIQUE: Multiplanar, multiecho pulse sequences of the brain and surrounding structures were obtained without intravenous contrast. COMPARISON:  Prior CT from 05/06/2016 as well as previous brain MRI from 08/28/2008. FINDINGS: Brain: Mild age-related cerebral atrophy present. Patchy T2/FLAIR hyperintensity within the periventricular and deep white matter both cerebral hemispheres most consistent with chronic small vessel ischemic disease, mild for age. No abnormal foci of restricted diffusion to suggest acute or subacute ischemia. Gray-white matter differentiation maintained. No encephalomalacia to suggest chronic infarction. No evidence for acute intracranial hemorrhage. Single punctate focus is susceptibility artifact noted within the right parietal lobe (series 9, image 20), consistent with a small chronic microhemorrhage, of doubtful significance in isolation. No other evidence for chronic hemorrhage. No mass lesion, midline shift or mass effect. No hydrocephalus. No extra-axial fluid collection. Major dural sinuses are grossly patent. Pituitary gland suprasellar region within normal limits. Vascular: Major intracranial vascular flow voids are well maintained. Skull and upper cervical spine: Craniocervical junction within normal limits. Visualized upper cervical spine unremarkable. Signal intensity within the visualized bone marrow is diffusely decreased on T1 weighted sequence, most like related to chronic anemia due to end-stage renal disease. No scalp soft tissue abnormality. Sinuses/Orbits: Globes and orbital soft tissues within normal limits. Small retention cyst noted within the right maxillary sinus. Paranasal sinuses are otherwise clear. No  mastoid effusion. Inner ear structures normal. IMPRESSION: 1. No acute intracranial infarct or other process identified. 2. Mild chronic small vessel ischemic disease for patient age. Electronically Signed   By: Jeannine Boga M.D.   On: 05/07/2017 14:40     Medications:   . sodium chloride     . aspirin EC  81 mg Oral Daily  . atorvastatin  30 mg Oral Daily  . calcium acetate  1,334 mg Oral TID WC  . ciprofloxacin  500 mg Oral Q24H  . clopidogrel  75 mg Oral Daily  . docusate sodium  100 mg Oral BID  . heparin  5,000 Units Subcutaneous Q8H  . insulin aspart  0-15 Units Subcutaneous TID WC  . insulin glargine  16 Units Subcutaneous QHS  . levothyroxine  150 mcg Oral QAC breakfast  . polyethylene glycol  17 g Oral Daily  . valACYclovir  1,000 mg Oral Q24H   acetaminophen **OR** acetaminophen, furosemide, lactulose, ondansetron **OR** ondansetron (ZOFRAN) IV, oxyCODONE,  senna-docusate, sodium phosphate  Assessment/ Plan:  66 y.o. female with past medical history of end-stage renal disease on hemodialysis MWF, anemia chronic kidney disease, secondary hyperparathyroidism, hypertension, hypothyroidism, diabetes mellitus type 2, osteoarthritis, diabetic retinopathy who was admitted for pyelonephritis and also was found to have herpes zoster infection.  [facility] [place]/MWF-2  1.  End-stage renal disease on hemodialysis MWF:  Patient completed hemodialysis yesterday. No acute indication for dialysis today. Next scheduled dialysis for Monday.  2.  Anemia chronic kidney disease.  Hemoglobin down a bit to 10.3. Resume Epogen as an outpatient.  3.  Secondary hyperparathyroidism. Okay to discharge the patient on calcium acetate.  3.  Diabetes mellitus type 2 with chronic kidney disease.  Blood sugar management as per hospitalist.  LOS: 1 Oreta Soloway 7/21/201811:59 AM

## 2017-05-08 NOTE — Care Management Note (Signed)
Case Management Note  Patient Details  Name: Kendra [name] MRN: [id number] Date of Birth: Sep 15, 1951  Subjective/Objective:      [name] chose [facility] to be her home health provider. A request for HH-PT, RN, Aide was called to Melene Muller at [facility].               Action/Plan:   Expected Discharge Date:  05/08/17               Expected Discharge Plan:   05/08/17  In-House Referral:     Discharge planning Services     Post Acute Care Choice:   Yes Choice offered to:   Patient  DME Arranged:    DME Agency:     HH Arranged:   RN, PT, Aid [facility]:   [facility]  Status of Service:   Completed  If discussed at [place] of Stay Meetings, dates discussed:    Additional Comments:  Denisia Harpole A, RN 05/08/2017, 1:47 PM

## 2017-05-08 NOTE — Progress Notes (Signed)
Pt d/c home; d/c instructions reviewed w/ pt; pt understanding was verbalized; IV removed, catheter in tact, gauze dressing applied; all pt questions answered; pt verbalized that all pt belongings were accounted for; pt left unit via wheelchair accompanied by staff 

## 2017-05-08 NOTE — Discharge Summary (Signed)
[facility] [place] NAME: Kendra [name]    MR#:  [id number]  DATE OF BIRTH:  07/15/1951  DATE OF ADMISSION:  05/04/2017 ADMITTING PHYSICIAN: Harrie Foreman, MD  DATE OF DISCHARGE: 05/08/2017   PRIMARY CARE PHYSICIAN: [name], MD    ADMISSION DIAGNOSIS:  Lower urinary tract infectious disease [N39.0] Flank pain [R10.9] Herpes zoster without complication [X83.3] Chest pain, unspecified type [R07.9]  DISCHARGE DIAGNOSIS:  Active Problems:   Pyelonephritis   SECONDARY DIAGNOSIS:   Past Medical History:  Diagnosis Date  . Anemia of chronic renal failure   . Diabetic neuropathy ([place])   . Diabetic retinopathy ([place])   . GERD (gastroesophageal reflux disease)   . Hyperparathyroidism, secondary renal ([place])   . Hypertension   . Hypothyroidism   . Insulin dependent diabetes mellitus ([place])   . Osteoarthritis    bilateral shoulders  . Primary hypertension     HOSPITAL COURSE:   66 year old female with past medical history significant for end-stage renal disease with Monday Wednesday Friday hemodialysis, diabetic retinopathy, neuropathy, hypertension, diabetes, osteoarthritis presents to hospital secondary to left-sided chest pain with new onset rash.  #1 acute cystitis-urine cultures with mixed organisms -Continue Cipro, renally dosed-Stopped after 5 days  #2 herpes zoster-was on IV acyclovir -Changed to oral Valtrex-renally dosed -Significant pain, pain meds added. Could not tolerate the capsaicin cream-so discontinue it Monitor -Lesions starting to scab - airborne isolation  #3 end-stage renal disease-on Monday Wednesday Friday hemodialysis-appreciate nephrology consult. -Dialysis today per schedule  #4 diabetes mellitus-continue Lantus and sliding scale insulin.  #5 Diplopia- new onset, already on plavix, add aspirin. On statin - MRI brain ordered- negative, this is going on for last 1-2 months  and she is scheduled for cataract surgery next month.  #6 DVT prophylaxis-on subcutaneous heparin   DISCHARGE CONDITIONS:   Stable.  CONSULTS OBTAINED:  Treatment Team:  Anthonette Legato, MD  DRUG ALLERGIES:  No Known Allergies  DISCHARGE MEDICATIONS:   Current Discharge Medication List    START taking these medications   Details  ciprofloxacin (CIPRO) 500 MG tablet Take 1 tablet (500 mg total) by mouth daily. X 3 more days Qty: 3 tablet, Refills: 0    oxyCODONE (OXY IR/ROXICODONE) 5 MG immediate release tablet Take 1 tablet (5 mg total) by mouth every 6 (six) hours as needed for moderate pain or severe pain. Qty: 20 tablet, Refills: 0    polyethylene glycol (MIRALAX / GLYCOLAX) packet Take 17 g by mouth daily. Qty: 14 each, Refills: 0    valACYclovir (VALTREX) 1000 MG tablet Take 1 tablet (1,000 mg total) by mouth daily. X 7 more days Qty: 7 tablet, Refills: 0      CONTINUE these medications which have CHANGED   Details  insulin detemir (LEVEMIR) 100 UNIT/ML injection Inject 0.16 mLs (16 Units total) into the skin at bedtime. Qty: 10 mL, Refills: 11      CONTINUE these medications which have NOT CHANGED   Details  acetaminophen (TYLENOL) 500 MG tablet Take 500 mg by mouth every 6 (six) hours as needed for mild pain.    Associated Diagnoses: Anemia of chronic kidney failure, stage 3 (moderate)    Calcium Acetate 668 (169 Ca) MG TABS Take 2 capsules by mouth 3 (three) times daily with meals.     clopidogrel (PLAVIX) 75 MG tablet Take 75 mg by mouth daily.    furosemide (LASIX) 40 MG tablet Take 80 mg by mouth  daily as needed for fluid.     levothyroxine (SYNTHROID, LEVOTHROID) 150 MCG tablet Take 150 mcg by mouth daily before breakfast.    atorvastatin (LIPITOR) 40 MG tablet Take 30 mg by mouth daily.    Insulin Pen Needle (PEN NEEDLES 31GX5/16") 31G X 8 MM MISC Use for insulin injection TID   Associated Diagnoses: Anemia of chronic kidney failure, stage 3  (moderate)    sennosides-docusate sodium (SENOKOT-S) 8.6-50 MG tablet Take 1 tablet by mouth daily as needed for constipation. 1-2 tabs      STOP taking these medications     cephALEXin (KEFLEX) 500 MG capsule      chlorpheniramine-HYDROcodone (TUSSIONEX PENNKINETIC ER) 10-8 MG/5ML SUER      insulin lispro (HUMALOG) 100 UNIT/ML injection          DISCHARGE INSTRUCTIONS:    Follow with PMD in 1-2 weeks.  If you experience worsening of your admission symptoms, develop shortness of breath, life threatening emergency, suicidal or homicidal thoughts you must seek medical attention immediately by calling 911 or calling your MD immediately  if symptoms less severe.  You Must read complete instructions/literature along with all the possible adverse reactions/side effects for all the Medicines you take and that have been prescribed to you. Take any new Medicines after you have completely understood and accept all the possible adverse reactions/side effects.   Please note  You were cared for by a hospitalist during your hospital stay. If you have any questions about your discharge medications or the care you received while you were in the hospital after you are discharged, you can call the unit and asked to speak with the hospitalist on call if the hospitalist that took care of you is not available. Once you are discharged, your primary care physician will handle any further medical issues. Please note that NO REFILLS for any discharge medications will be authorized once you are discharged, as it is imperative that you return to your primary care physician (or establish a relationship with a primary care physician if you do not have one) for your aftercare needs so that they can reassess your need for medications and monitor your lab values.    Today   CHIEF COMPLAINT:   Chief Complaint  Patient presents with  . Back Pain    HISTORY OF PRESENT ILLNESS:  Kendra [name]  is a 66 y.o.  female with a known history of diabetes, hypertension, hypothyroidism as well as end-stage renal disease on dialysis presents emergency department complaining of back pain. The patient has been feeling unwell for 5 days. She denies fever but admits to occasional nausea. Her flanks of heard bilaterally and she also complains of back pain is specifically radiates to her left breast. She denies fevers but admits to weakness. CT of the abdomen in the emergency department revealed perinephric stranding bilaterally consistent with pyelonephritis. She was given a dose of ciprofloxacin prior to the emergency [place] staff called the hospitalist service for further management.   VITAL SIGNS:  Blood pressure (!) 112/54, pulse 78, temperature 98.6 F (37 C), temperature source Oral, resp. rate 20, height 5\' 7"  (1.702 m), weight 108.8 kg (239 lb 13.8 oz), SpO2 97 %.  I/O:   Intake/Output Summary (Last 24 hours) at 05/08/17 1240 Last data filed at 05/08/17 0800  Gross per 24 hour  Intake                0 ml  Output  0 ml  Net                0 ml    PHYSICAL EXAMINATION:   GENERAL:  66 y.o.-year-old patient lying in the bed with no acute distress.  EYES: Pupils equal, round, reactive to light and accommodation. No scleral icterus. Extraocular muscles intact.  HEENT: Head atraumatic, normocephalic. Oropharynx and nasopharynx clear.  NECK:  Supple, no jugular venous distention. No thyroid enlargement, no tenderness.  LUNGS: Normal breath sounds bilaterally, no wheezing, rales,rhonchi or crepitation. No use of accessory muscles of respiration.  CARDIOVASCULAR: S1, S2 normal. No  rubs, or gallops. 2/6 systolic murmur present ABDOMEN: Soft, nontender, nondistended. Bowel sounds present. No organomegaly or mass.  EXTREMITIES: No pedal edema, cyanosis, or clubbing. Left upper arm AV fistula with good thrill. NEUROLOGIC: Cranial nerves II through XII are intact. Muscle strength 5/5 in all  extremities. Sensation intact. Gait not checked.  PSYCHIATRIC: The patient is alert and oriented x 3.  SKIN: Cluster of erythematous lesions on the left upper back- are starting to scab off.  DATA REVIEW:   CBC  Recent Labs Lab 05/08/17 0550  WBC 5.7  HGB 10.3*  HCT 29.7*  PLT 198    Chemistries   Recent Labs Lab 05/04/17 0135 05/04/17 0222  05/07/17 0411  NA 133*  --   < > 137  K 4.5  --   < > 4.2  CL 92*  --   < > 93*  CO2 30  --   < > 29  GLUCOSE 198*  --   < > 86  BUN 32*  --   < > 52*  CREATININE 6.47*  --   < > 8.79*  CALCIUM 8.7*  --   < > 8.4*  MG  --  2.2  --   --   AST 40  --   --   --   ALT 25  --   --   --   ALKPHOS 49  --   --   --   BILITOT 1.0  --   --   --   < > = values in this interval not displayed.  Cardiac Enzymes  Recent Labs Lab 05/04/17 0222  TROPONINI <0.03    Microbiology Results  Results for orders placed or performed during the hospital encounter of 05/04/17  Urine culture     Status: Abnormal   Collection Time: 05/02/17  4:31 AM  Result Value Ref Range Status   Specimen Description URINE, RANDOM  Final   Special Requests NONE  Final   Culture MULTIPLE SPECIES PRESENT, SUGGEST RECOLLECTION (A)  Final   Report Status 05/05/2017 FINAL  Final  MRSA PCR Screening     Status: None   Collection Time: 05/04/17  8:44 AM  Result Value Ref Range Status   MRSA by PCR NEGATIVE NEGATIVE Final    Comment:        The GeneXpert MRSA Assay (FDA approved for NASAL specimens only), is one component of a comprehensive MRSA colonization surveillance program. It is not intended to diagnose MRSA infection nor to guide or monitor treatment for MRSA infections.     RADIOLOGY:  Dg Abd 1 View  Result Date: 05/06/2017 CLINICAL DATA:  Nausea and vomiting. EXAM: ABDOMEN - 1 VIEW COMPARISON:  CT 05/04/2017 . FINDINGS: Surgical clips right upper quadrant . Soft tissue structures are unremarkable. No bowel distention. No free air. Degenerative  changes thoracic and lumbar spine. Lumbar spine scoliosis. IMPRESSION:  No acute abnormality. Electronically Signed   By: Marcello Moores  Register   On: 05/06/2017 14:05   Mr Brain Wo Contrast  Result Date: 05/07/2017 CLINICAL DATA:  Initial evaluation for acute diplopia, evaluate for stroke. History of end-stage renal disease. EXAM: MRI HEAD WITHOUT CONTRAST TECHNIQUE: Multiplanar, multiecho pulse sequences of the brain and surrounding structures were obtained without intravenous contrast. COMPARISON:  Prior CT from 05/06/2016 as well as previous brain MRI from 08/28/2008. FINDINGS: Brain: Mild age-related cerebral atrophy present. Patchy T2/FLAIR hyperintensity within the periventricular and deep white matter both cerebral hemispheres most consistent with chronic small vessel ischemic disease, mild for age. No abnormal foci of restricted diffusion to suggest acute or subacute ischemia. Gray-white matter differentiation maintained. No encephalomalacia to suggest chronic infarction. No evidence for acute intracranial hemorrhage. Single punctate focus is susceptibility artifact noted within the right parietal lobe (series 9, image 20), consistent with a small chronic microhemorrhage, of doubtful significance in isolation. No other evidence for chronic hemorrhage. No mass lesion, midline shift or mass effect. No hydrocephalus. No extra-axial fluid collection. Major dural sinuses are grossly patent. Pituitary gland suprasellar region within normal limits. Vascular: Major intracranial vascular flow voids are well maintained. Skull and upper cervical spine: Craniocervical junction within normal limits. Visualized upper cervical spine unremarkable. Signal intensity within the visualized bone marrow is diffusely decreased on T1 weighted sequence, most like related to chronic anemia due to end-stage renal disease. No scalp soft tissue abnormality. Sinuses/Orbits: Globes and orbital soft tissues within normal limits. Small  retention cyst noted within the right maxillary sinus. Paranasal sinuses are otherwise clear. No mastoid effusion. Inner ear structures normal. IMPRESSION: 1. No acute intracranial infarct or other process identified. 2. Mild chronic small vessel ischemic disease for patient age. Electronically Signed   By: Jeannine Boga M.D.   On: 05/07/2017 14:40    EKG:   Orders placed or performed during the hospital encounter of 05/04/17  . ED EKG  . ED EKG  . EKG 12-Lead  . EKG 12-Lead      Management plans discussed with the patient, family and they are in agreement.  CODE STATUS:     Code Status Orders        Start     Ordered   05/04/17 0654  Full code  Continuous     05/04/17 0653    Code Status History    Date Active Date Inactive Code Status Order ID Comments User Context   08/03/2016  3:12 PM 08/03/2016  8:23 PM Full Code [id number]  Algernon Huxley, MD Inpatient   05/14/2016 12:34 PM 05/14/2016  4:31 PM Full Code [id number]  Algernon Huxley, MD Inpatient      TOTAL TIME TAKING CARE OF THIS PATIENT: 35 minutes.    Vaughan Basta M.D on 05/08/2017 at 12:40 PM  Between 7am to 6pm - Pager - [phone]  After 6pm go to [email or web address] - password [email or web address] [place] [facility]  Office  [phone]  CC: Primary care physician; [name], MD   Note: This dictation was prepared with Dragon dictation along with smaller phrase technology. Any transcriptional errors that result from this process are unintentional.

## 2017-05-21 ENCOUNTER — Emergency Department: Payer: Medicare Other

## 2017-05-21 ENCOUNTER — Encounter: Payer: Self-pay | Admitting: Emergency Medicine

## 2017-05-21 ENCOUNTER — Emergency Department
Admission: EM | Admit: 2017-05-21 | Discharge: 2017-05-21 | Disposition: A | Discharge status: Home / Self Care | Payer: Medicare Other | Attending: Student in an Organized Health Care Education/Training Program | Admitting: Student in an Organized Health Care Education/Training Program

## 2017-05-21 DIAGNOSIS — Z79899 Other long term (current) drug therapy: Secondary | ICD-10-CM | POA: Insufficient documentation

## 2017-05-21 DIAGNOSIS — I120 Hypertensive chronic kidney disease with stage 5 chronic kidney disease or end stage renal disease: Secondary | ICD-10-CM | POA: Diagnosis not present

## 2017-05-21 DIAGNOSIS — N186 End stage renal disease: Secondary | ICD-10-CM | POA: Insufficient documentation

## 2017-05-21 DIAGNOSIS — E1122 Type 2 diabetes mellitus with diabetic chronic kidney disease: Secondary | ICD-10-CM | POA: Insufficient documentation

## 2017-05-21 DIAGNOSIS — R109 Unspecified abdominal pain: Secondary | ICD-10-CM | POA: Insufficient documentation

## 2017-05-21 DIAGNOSIS — E1140 Type 2 diabetes mellitus with diabetic neuropathy, unspecified: Secondary | ICD-10-CM | POA: Diagnosis not present

## 2017-05-21 DIAGNOSIS — Z794 Long term (current) use of insulin: Secondary | ICD-10-CM | POA: Diagnosis not present

## 2017-05-21 LAB — CBC WITH DIFFERENTIAL/PLATELET
BASOS ABS: 0 10*3/uL (ref 0–0.1)
BASOS PCT: 0 %
EOS PCT: 4 %
Eosinophils Absolute: 0.2 10*3/uL (ref 0–0.7)
HCT: 31.2 % — ABNORMAL LOW (ref 35.0–47.0)
Hemoglobin: 10.5 g/dL — ABNORMAL LOW (ref 12.0–16.0)
LYMPHS PCT: 26 %
Lymphs Abs: 1.3 10*3/uL (ref 1.0–3.6)
MCH: 33.6 pg (ref 26.0–34.0)
MCHC: 33.8 g/dL (ref 32.0–36.0)
MCV: 99.2 fL (ref 80.0–100.0)
Monocytes Absolute: 0.6 10*3/uL (ref 0.2–0.9)
Monocytes Relative: 12 %
Neutro Abs: 2.9 10*3/uL (ref 1.4–6.5)
Neutrophils Relative %: 58 %
PLATELETS: 354 10*3/uL (ref 150–440)
RBC: 3.14 MIL/uL — AB (ref 3.80–5.20)
RDW: 13.1 % (ref 11.5–14.5)
WBC: 5 10*3/uL (ref 3.6–11.0)

## 2017-05-21 LAB — COMPREHENSIVE METABOLIC PANEL
ALBUMIN: 3.9 g/dL (ref 3.5–5.0)
ALT: 24 U/L (ref 14–54)
AST: 35 U/L (ref 15–41)
Alkaline Phosphatase: 50 U/L (ref 38–126)
Anion gap: 10 (ref 5–15)
BUN: 27 mg/dL — AB (ref 6–20)
CHLORIDE: 95 mmol/L — AB (ref 101–111)
CO2: 27 mmol/L (ref 22–32)
CREATININE: 8.73 mg/dL — AB (ref 0.44–1.00)
Calcium: 9.3 mg/dL (ref 8.9–10.3)
GFR calc Af Amer: 5 mL/min — ABNORMAL LOW (ref >60)
GFR calc non Af Amer: 4 mL/min — ABNORMAL LOW (ref >60)
Glucose, Bld: 126 mg/dL — ABNORMAL HIGH (ref 65–99)
Potassium: 3.5 mmol/L (ref 3.5–5.1)
SODIUM: 132 mmol/L — AB (ref 135–145)
Total Bilirubin: 0.5 mg/dL (ref 0.3–1.2)
Total Protein: 6.8 g/dL (ref 6.5–8.1)

## 2017-05-21 MED ORDER — OXYCODONE-ACETAMINOPHEN 7.5-325 MG PO TABS: 1.0000 | ORAL_TABLET | Freq: Three times a day (TID) | ORAL | 0 refills | Status: DC | PRN

## 2017-05-21 MED ORDER — OXYCODONE-ACETAMINOPHEN 5-325 MG PO TABS
2.0000 | ORAL_TABLET | Freq: Once | ORAL | Status: AC
  Administered 2017-05-21: 2 via ORAL
  Filled 2017-05-21: qty 2

## 2017-05-21 MED ORDER — MORPHINE SULFATE (PF) 4 MG/ML IV SOLN
4.0000 mg | Freq: Once | INTRAVENOUS | Status: AC
  Administered 2017-05-21: 4 mg via INTRAVENOUS
  Filled 2017-05-21: qty 1

## 2017-05-21 MED ORDER — TRAMADOL HCL 50 MG PO TABS
50.0000 mg | ORAL_TABLET | Freq: Once | ORAL | Status: AC
  Administered 2017-05-21: 50 mg via ORAL
  Filled 2017-05-21: qty 1

## 2017-05-21 NOTE — ED Provider Notes (Addendum)
Patient's in no distress, I feel the pain is likely related to postherpetic neuralgia. It is unclear if gabapentin would be beneficial for her and not especially with her being on dialysis. I will prescribe stronger pain medicine for her and advised her take a stool soft. She is also advised to have her urine checked by her doctor for infection. She was unable to provide a urine specimen for evaluation today.   Earleen Newport, MD 05/21/17 3338    Earleen Newport, MD 05/21/17 [phone]

## 2017-05-21 NOTE — ED Provider Notes (Signed)
[facility] Emergency Department Provider Note    First MD Initiated Contact with Patient 05/21/17 [phone]     (approximate)  I have reviewed the triage vital signs and the nursing notes.   HISTORY  Chief Complaint Back Pain    HPI Kendra [name] is a 66 y.o. female presents with chief complaint of persistent left flank pain intermittent over the past week. Patient was recently treated for shingles in the left flank but that has improved. Patient states she's also was recently treated for urinary tract infection beginning last week but is still having left flank pain and persistent urinary frequency despite antibiotics. Patient presents to the ER today because she's having persistent pain keeping her from being able to sleep. Denies any fevers. No nausea or vomiting. No chest pain. No shortness of breath.   Past Medical History:  Diagnosis Date  . Anemia of chronic renal failure   . Diabetic neuropathy ([place])   . Diabetic retinopathy ([place])   . GERD (gastroesophageal reflux disease)   . Hyperparathyroidism, secondary renal ([place])   . Hypertension   . Hypothyroidism   . Insulin dependent diabetes mellitus ([place])   . Osteoarthritis    bilateral shoulders  . Primary hypertension    Family History  Problem Relation Age of Onset  . Renal Disease Brother        with renal transplant  . Diabetes Mellitus II Father   . Diabetes Mellitus II Mother   . Diabetes Mellitus II Brother    Past Surgical History:  Procedure Laterality Date  . ABDOMINAL HYSTERECTOMY    . BASCILIC VEIN TRANSPOSITION Left 03/04/2016   Procedure: BASCILIC VEIN TRANSPOSITION ( STAGE 1 );  Surgeon: Algernon Huxley, MD;  Location: [facility] ORS;  Service: Vascular;  Laterality: Left;  . CHOLECYSTECTOMY    . PERIPHERAL VASCULAR CATHETERIZATION N/A 01/22/2016   Procedure: Dialysis/Perma Catheter Insertion;  Surgeon: Katha Cabal, MD;  Location: [place] [facility];  Service: Cardiovascular;   Laterality: N/A;  . PERIPHERAL VASCULAR CATHETERIZATION Left 05/14/2016   Procedure: A/V Shuntogram/Fistulagram;  Surgeon: Algernon Huxley, MD;  Location: [place] [facility];  Service: Cardiovascular;  Laterality: Left;  . PERIPHERAL VASCULAR CATHETERIZATION N/A 05/14/2016   Procedure: A/V Shunt Intervention;  Surgeon: Algernon Huxley, MD;  Location: [place] [facility];  Service: Cardiovascular;  Laterality: N/A;  . PERIPHERAL VASCULAR CATHETERIZATION N/A 06/25/2016   Procedure: Dialysis/Perma Catheter Removal;  Surgeon: Algernon Huxley, MD;  Location: [place] [facility];  Service: Cardiovascular;  Laterality: N/A;  . PERIPHERAL VASCULAR CATHETERIZATION Left 08/03/2016   Procedure: A/V Shuntogram/Fistulagram;  Surgeon: Algernon Huxley, MD;  Location: [place] [facility];  Service: Cardiovascular;  Laterality: Left;  . PERIPHERAL VASCULAR CATHETERIZATION N/A 08/03/2016   Procedure: A/V Shunt Intervention;  Surgeon: Algernon Huxley, MD;  Location: [place] [facility];  Service: Cardiovascular;  Laterality: N/A;  . PERIPHERAL VASCULAR CATHETERIZATION Left 08/07/2016   Procedure: A/V Shuntogram/Fistulagram;  Surgeon: Katha Cabal, MD;  Location: [place] [facility];  Service: Cardiovascular;  Laterality: Left;  . PERIPHERAL VASCULAR CATHETERIZATION Left 09/22/2016   Procedure: A/V Fistulagram;  Surgeon: Katha Cabal, MD;  Location: [place] [facility];  Service: Cardiovascular;  Laterality: Left;  . THYROIDECTOMY     Patient Active Problem List   Diagnosis Date Noted  . Pyelonephritis 05/04/2017  . Complication from renal dialysis device 09/21/2016  . ESRD on dialysis ([place]) 09/21/2016  . BP (high blood pressure)  07/15/2015  . Diabetes mellitus, type 2 ([place]) 07/15/2015  . Absolute anemia 07/15/2015      Prior to Admission medications   Medication Sig Start Date End Date Taking? Authorizing Provider  acetaminophen (TYLENOL) 500 MG tablet Take 500 mg by mouth every 6 (six) hours as needed for mild  pain.     [provider]  atorvastatin (LIPITOR) 40 MG tablet Take 30 mg by mouth daily. 09/05/16   [provider]  Calcium Acetate 668 (169 Ca) MG TABS Take 2 capsules by mouth 3 (three) times daily with meals.     [provider]  ciprofloxacin (CIPRO) 500 MG tablet Take 1 tablet (500 mg total) by mouth daily. X 3 more days 05/07/17   Gladstone Lighter, MD  clopidogrel (PLAVIX) 75 MG tablet Take 75 mg by mouth daily. 09/08/16   [provider]  furosemide (LASIX) 40 MG tablet Take 80 mg by mouth daily as needed for fluid.     [provider]  insulin detemir (LEVEMIR) 100 UNIT/ML injection Inject 0.16 mLs (16 Units total) into the skin at bedtime. 05/07/17   Gladstone Lighter, MD  Insulin Pen Needle (PEN NEEDLES 31GX5/16") 31G X 8 MM MISC Use for insulin injection TID 07/31/14   [provider]  levothyroxine (SYNTHROID, LEVOTHROID) 150 MCG tablet Take 150 mcg by mouth daily before breakfast.    [provider]  oxyCODONE (OXY IR/ROXICODONE) 5 MG immediate release tablet Take 1 tablet (5 mg total) by mouth every 6 (six) hours as needed for moderate pain or severe pain. 05/08/17   Vaughan Basta, MD  polyethylene glycol (MIRALAX / GLYCOLAX) packet Take 17 g by mouth daily. 05/08/17   Gladstone Lighter, MD  sennosides-docusate sodium (SENOKOT-S) 8.6-50 MG tablet Take 1 tablet by mouth daily as needed for constipation. 1-2 tabs    [provider]  valACYclovir (VALTREX) 1000 MG tablet Take 1 tablet (1,000 mg total) by mouth daily. X 7 more days 05/07/17   Gladstone Lighter, MD    Allergies Patient has no known allergies.    Social History Social History  Substance Use Topics  . Smoking status: Never Smoker  . Smokeless tobacco: Never Used  . Alcohol use No    Review of Systems Patient denies headaches, rhinorrhea, blurry vision, numbness, shortness of breath, chest pain, edema, cough, abdominal pain, nausea,  vomiting, diarrhea, dysuria, fevers, rashes or hallucinations unless otherwise stated above in HPI. ____________________________________________   PHYSICAL EXAM:  VITAL SIGNS: Vitals:   05/21/17 0552 05/21/17 0559  BP: (!) 156/76 (!) 156/76  Pulse: 84 77  Resp: 18 19  Temp: 98.6 F (37 C)     Constitutional: Alert and oriented. in no acute distress. Eyes: Conjunctivae are normal.  Head: Atraumatic. Nose: No congestion/rhinnorhea. Mouth/Throat: Mucous membranes are moist.   Neck: No stridor. Painless ROM.  Cardiovascular: Normal rate, regular rhythm. Grossly normal heart sounds.  Good peripheral circulation. Respiratory: Normal respiratory effort.  No retractions. Lungs CTAB. Gastrointestinal: Soft and nontender. No distention. No abdominal bruits. + left CVA tenderness. Musculoskeletal: No lower extremity tenderness nor edema.  No joint effusions. Neurologic:  Normal speech and language. No gross focal neurologic deficits are appreciated. No facial droop Skin:  Skin is warm, dry and intact. No rash noted.  Healing dermatomal rash consistent with healing shingles to left posterior thorax Psychiatric: Mood and affect are normal. Speech and behavior are normal.  ____________________________________________   LABS (all labs ordered are listed, but only abnormal results are displayed)  No results found for this or any previous visit (from the past 24 hour(s)). ____________________________________________  ____________________________________________  [email or web address]  I personally reviewed all radiographic images ordered to evaluate for the above acute complaints and reviewed radiology reports and findings.  These findings were personally discussed with the patient.  Please see medical record for radiology report.  ____________________________________________   PROCEDURES  Procedure(s) performed:  Procedures    Critical Care performed:  no ____________________________________________   INITIAL IMPRESSION / ASSESSMENT AND PLAN / ED COURSE  Pertinent labs & imaging results that were available during my care of the patient were reviewed by me and considered in my medical decision making (see chart for details).  DDX: stone, pyelo, coltiis, shingles, neuropathy  Kendra [name] is a 66 y.o. who presents to the ED with left flank pain as described above. Pain seems to be located more inferior than the shingles rash therefore given her recent treatment for UTI and concern for possible stone or pyelonephritis. Her abdominal exam is otherwise soft and benign. She is afebrile and hemodynamic stable. CT imaging ordered to evaluate for stone shows small nonobstructing stone in the left side. Bladder does appear edematous and likely consistent with acute cystitis. Blood work ordered to evaluate for the above differential. Urinalysis will be ordered. Patient will be given oral medication for pain. Patient will be signed out to Dr. Jimmye Norman pending results of blood work.     ____________________________________________   FINAL CLINICAL IMPRESSION(S) / ED DIAGNOSES  Final diagnoses:  Acute left flank pain      NEW MEDICATIONS STARTED DURING THIS VISIT:  New Prescriptions   No medications on file     Note:  This document was prepared using Dragon voice recognition software and may include unintentional dictation errors.    Merlyn Lot, MD 05/21/17 [phone]

## 2017-05-21 NOTE — ED Triage Notes (Addendum)
Patient ambulatory to triage with steady gait, without difficulty or distress noted; pt reports left sided back pain; tx recently for shingles (completed meds and has since resolved) and kidney infection; rx pain meds this week by PCP with no relief

## 2017-05-21 NOTE — ED Notes (Signed)
Pt aware that a urine specimen is ordered, pt states she does not produce urine on a daily basis due to dialysis.. MD is aware,. Will continue to monitor the pt.

## 2017-05-21 NOTE — ED Notes (Signed)
Pt reports she is hurting on her left side where she had shingles 3 weeks ago. Side has had pain for the last week.

## 2017-07-06 ENCOUNTER — Ambulatory Visit (INDEPENDENT_AMBULATORY_CARE_PROVIDER_SITE_OTHER): Payer: Medicare Other | Admitting: Vascular Surgery

## 2017-07-06 ENCOUNTER — Encounter (INDEPENDENT_AMBULATORY_CARE_PROVIDER_SITE_OTHER): Payer: Medicare Other

## 2017-08-02 ENCOUNTER — Other Ambulatory Visit (INDEPENDENT_AMBULATORY_CARE_PROVIDER_SITE_OTHER): Payer: Self-pay | Admitting: Vascular Surgery

## 2017-08-02 DIAGNOSIS — N185 Chronic kidney disease, stage 5: Secondary | ICD-10-CM

## 2017-08-05 ENCOUNTER — Encounter (INDEPENDENT_AMBULATORY_CARE_PROVIDER_SITE_OTHER): Payer: Self-pay | Admitting: Vascular Surgery

## 2017-08-05 ENCOUNTER — Ambulatory Visit (INDEPENDENT_AMBULATORY_CARE_PROVIDER_SITE_OTHER): Payer: Medicare Other | Admitting: Vascular Surgery

## 2017-08-05 ENCOUNTER — Encounter (INDEPENDENT_AMBULATORY_CARE_PROVIDER_SITE_OTHER): Payer: Self-pay

## 2017-08-05 ENCOUNTER — Ambulatory Visit (INDEPENDENT_AMBULATORY_CARE_PROVIDER_SITE_OTHER): Payer: Medicare Other

## 2017-08-05 VITALS — BP 172/86 | HR 75 | Resp 16 | Ht 66.0 in | Wt 220.0 lb

## 2017-08-05 DIAGNOSIS — T829XXD Unspecified complication of cardiac and vascular prosthetic device, implant and graft, subsequent encounter: Secondary | ICD-10-CM | POA: Diagnosis not present

## 2017-08-05 DIAGNOSIS — I639 Cerebral infarction, unspecified: Secondary | ICD-10-CM | POA: Diagnosis not present

## 2017-08-05 DIAGNOSIS — N186 End stage renal disease: Secondary | ICD-10-CM | POA: Diagnosis not present

## 2017-08-05 DIAGNOSIS — E118 Type 2 diabetes mellitus with unspecified complications: Secondary | ICD-10-CM

## 2017-08-05 DIAGNOSIS — Z992 Dependence on renal dialysis: Secondary | ICD-10-CM

## 2017-08-05 DIAGNOSIS — N185 Chronic kidney disease, stage 5: Secondary | ICD-10-CM

## 2017-08-05 NOTE — Progress Notes (Signed)
Subjective:    Patient ID: Kendra [name], female    DOB: 1951/10/17, 66 y.o.   MRN: [id number] Chief Complaint  Patient presents with  . Follow-up    HDA low flows   Patient presents at the request of her dialysis center for "difficulties during dialysis." The patient presents today complaining of difficult cannulation during dialysis. Patient also notes that she is unable to complete dialysis appropriately as a machine will turn off. The patient underwent a left upper extremity dialysis duplex which was notable for a patent left brachiocephalic AV fistula with narrowing and elevated velocities at the middle left upper arm celiac vein. Patent left brachial artery proximal and distal to anastomosis. Torturous and narrow perianastomotic area of the left AV fistula with elevated velocities. Dilatation of the left upper arm cephalic vein. She denies any left upper extremity. Patient denies any fever, nausea or vomiting.   Review of Systems  Constitutional: Negative.   HENT: Negative.   Eyes: Negative.   Respiratory: Negative.   Cardiovascular:       Trouble with dialysis  Gastrointestinal: Negative.   Endocrine: Negative.   Genitourinary: Negative.   Musculoskeletal: Negative.   Skin: Negative.   Allergic/Immunologic: Negative.   Neurological: Negative.   Hematological: Negative.   Psychiatric/Behavioral: Negative.       Objective:   Physical Exam  Constitutional: She is oriented to person, place, and time. She appears well-developed. No distress.  HENT:  Head: Normocephalic and atraumatic.  Eyes: Conjunctivae are normal.  Neck: Normal range of motion.  Cardiovascular: Normal rate, regular rhythm, normal heart sounds and intact distal pulses.   Pulses:      Radial pulses are 2+ on the right side, and 2+ on the left side.  Left upper extremity access: good bruit and thrill. Skin intact.  Pulmonary/Chest: Effort normal.  Musculoskeletal: Normal range of motion. She exhibits no  edema.  Neurological: She is alert and oriented to person, place, and time.  Skin: Skin is warm and dry. She is not diaphoretic.  Psychiatric: She has a normal mood and affect. Her behavior is normal. Judgment and thought content normal.  Vitals reviewed.  BP (!) 172/86 (BP Location: Right Arm)   Pulse 75   Resp 16   Ht 5\' 6"  (1.676 m)   Wt 220 lb (99.8 kg)   BMI 35.51 kg/m   Past Medical History:  Diagnosis Date  . Anemia of chronic renal failure   . Diabetic neuropathy ([place])   . Diabetic retinopathy ([place])   . GERD (gastroesophageal reflux disease)   . Hyperparathyroidism, secondary renal ([place])   . Hypertension   . Hypothyroidism   . Insulin dependent diabetes mellitus ([place])   . Osteoarthritis    bilateral shoulders  . Primary hypertension    Social History   Social History  . Marital status: Single    Spouse name: N/A  . Number of children: N/A  . Years of education: N/A   Occupational History  . Not on file.   Social History Main Topics  . Smoking status: Never Smoker  . Smokeless tobacco: Never Used  . Alcohol use No  . Drug use: No  . Sexual activity: Not Currently   Other Topics Concern  . Not on file   Social History Narrative   Single lives alone.  She is employed. She has no impairements   Past Surgical History:  Procedure Laterality Date  . ABDOMINAL HYSTERECTOMY    . BASCILIC VEIN TRANSPOSITION Left 03/04/2016  Procedure: BASCILIC VEIN TRANSPOSITION ( STAGE 1 );  Surgeon: Algernon Huxley, MD;  Location: [facility];  Service: Vascular;  Laterality: Left;  . CHOLECYSTECTOMY    . PERIPHERAL VASCULAR CATHETERIZATION N/A 01/22/2016   Procedure: Dialysis/Perma Catheter Insertion;  Surgeon: Katha Cabal, MD;  Location: [place] [facility];  Service: Cardiovascular;  Laterality: N/A;  . PERIPHERAL VASCULAR CATHETERIZATION Left 05/14/2016   Procedure: A/V Shuntogram/Fistulagram;  Surgeon: Algernon Huxley, MD;  Location: [place] [facility];  Service:  Cardiovascular;  Laterality: Left;  . PERIPHERAL VASCULAR CATHETERIZATION N/A 05/14/2016   Procedure: A/V Shunt Intervention;  Surgeon: Algernon Huxley, MD;  Location: [place] [facility];  Service: Cardiovascular;  Laterality: N/A;  . PERIPHERAL VASCULAR CATHETERIZATION N/A 06/25/2016   Procedure: Dialysis/Perma Catheter Removal;  Surgeon: Algernon Huxley, MD;  Location: [place] [facility];  Service: Cardiovascular;  Laterality: N/A;  . PERIPHERAL VASCULAR CATHETERIZATION Left 08/03/2016   Procedure: A/V Shuntogram/Fistulagram;  Surgeon: Algernon Huxley, MD;  Location: [place] [facility];  Service: Cardiovascular;  Laterality: Left;  . PERIPHERAL VASCULAR CATHETERIZATION N/A 08/03/2016   Procedure: A/V Shunt Intervention;  Surgeon: Algernon Huxley, MD;  Location: [place] [facility];  Service: Cardiovascular;  Laterality: N/A;  . PERIPHERAL VASCULAR CATHETERIZATION Left 08/07/2016   Procedure: A/V Shuntogram/Fistulagram;  Surgeon: Katha Cabal, MD;  Location: [place] [facility];  Service: Cardiovascular;  Laterality: Left;  . PERIPHERAL VASCULAR CATHETERIZATION Left 09/22/2016   Procedure: A/V Fistulagram;  Surgeon: Katha Cabal, MD;  Location: [place] [facility];  Service: Cardiovascular;  Laterality: Left;  . THYROIDECTOMY     Family History  Problem Relation Age of Onset  . Renal Disease Brother        with renal transplant  . Diabetes Mellitus II Father   . Diabetes Mellitus II Mother   . Diabetes Mellitus II Brother    No Known Allergies     Assessment & Plan:  Patient presents at the request of her dialysis center for "difficulties during dialysis." The patient presents today complaining of difficult cannulation during dialysis. Patient also notes that she is unable to complete dialysis appropriately as a machine will turn off. The patient underwent a left upper extremity dialysis duplex which was notable for a patent left brachiocephalic AV fistula with narrowing and elevated  velocities at the middle left upper arm celiac vein. Patent left brachial artery proximal and distal to anastomosis. Torturous and narrow perianastomotic area of the left AV fistula with elevated velocities. Dilatation of the left upper arm cephalic vein. She denies any left upper extremity. Patient denies any fever, nausea or vomiting.  1. Complication from renal dialysis device, subsequent encounter - Stable Patient presents today at the request of her dialysis center due to difficulties with dialysis Patient is experiencing issues with cannulation and the machine turning off. There is an area of elevated velocities approximately mid fistula. Recommend a left upper kidney fistulogram with possible intervention to assess anatomy and possibly correct area of stenosis. Procedure, risks and benefits explained to the patient All questions answered Patient wishes to proceed  2. ESRD on dialysis ([place]) - Stable As above  3. Type 2 diabetes mellitus with complication, unspecified whether long term insulin use ([facility]) - Stable Encouraged good control as its slows the progression of atherosclerotic disease   Current Outpatient Prescriptions on File Prior to Visit  Medication Sig Dispense Refill  . acetaminophen (TYLENOL) 500 MG tablet Take 500 mg by mouth every  6 (six) hours as needed for mild pain.     [name] atorvastatin (LIPITOR) 80 MG tablet Take 80 mg by mouth daily.     . Calcium Acetate 668 (169 Ca) MG TABS Take 2 capsules by mouth 3 (three) times daily with meals.     . clopidogrel (PLAVIX) 75 MG tablet Take 75 mg by mouth daily.    . furosemide (LASIX) 80 MG tablet Take 80 mg by mouth daily as needed for fluid.     [name] insulin detemir (LEVEMIR) 100 UNIT/ML injection Inject 0.16 mLs (16 Units total) into the skin at bedtime. 10 mL 11  . levothyroxine (SYNTHROID, LEVOTHROID) 150 MCG tablet Take 150 mcg by mouth daily before breakfast.    . oxyCODONE-acetaminophen (PERCOCET) 7.5-325 MG tablet Take 1  tablet by mouth every 8 (eight) hours as needed for severe pain. 20 tablet 0  . polyethylene glycol (MIRALAX / GLYCOLAX) packet Take 17 g by mouth daily. 14 each 0  . sennosides-docusate sodium (SENOKOT-S) 8.6-50 MG tablet Take 2 tablets by mouth 2 (two) times daily.     [name] oxyCODONE (OXY IR/ROXICODONE) 5 MG immediate release tablet Take 1 tablet (5 mg total) by mouth every 6 (six) hours as needed for moderate pain or severe pain. (Patient not taking: Reported on 05/21/2017) 20 tablet 0  . valACYclovir (VALTREX) 1000 MG tablet Take 1 tablet (1,000 mg total) by mouth daily. X 7 more days (Patient not taking: Reported on 05/21/2017) 7 tablet 0   No current facility-administered medications on file prior to visit.     There are no Patient Instructions on file for this visit. No Follow-up on file.   Abbeygail Igoe A Spruha Weight, PA-C

## 2017-08-13 ENCOUNTER — Other Ambulatory Visit (INDEPENDENT_AMBULATORY_CARE_PROVIDER_SITE_OTHER): Payer: Self-pay | Admitting: Vascular Surgery

## 2017-08-18 MED ORDER — CEFAZOLIN SODIUM-DEXTROSE 1-4 GM/50ML-% IV SOLN
1.0000 g | Freq: Once | INTRAVENOUS | Status: AC
  Administered 2017-08-19: 1 g via INTRAVENOUS

## 2017-08-19 ENCOUNTER — Encounter
Admission: RE | Disposition: A | Discharge status: Home / Self Care | Payer: Self-pay | Source: Ambulatory Visit | Attending: Vascular Surgery

## 2017-08-19 ENCOUNTER — Ambulatory Visit
Admission: RE | Admit: 2017-08-19 | Discharge: 2017-08-19 | Disposition: A | Discharge status: Home / Self Care | Payer: Medicare Other | Source: Ambulatory Visit | Attending: Vascular Surgery | Admitting: Vascular Surgery

## 2017-08-19 DIAGNOSIS — Z841 Family history of disorders of kidney and ureter: Secondary | ICD-10-CM | POA: Insufficient documentation

## 2017-08-19 DIAGNOSIS — Y832 Surgical operation with anastomosis, bypass or graft as the cause of abnormal reaction of the patient, or of later complication, without mention of misadventure at the time of the procedure: Secondary | ICD-10-CM | POA: Diagnosis not present

## 2017-08-19 DIAGNOSIS — Z7902 Long term (current) use of antithrombotics/antiplatelets: Secondary | ICD-10-CM | POA: Insufficient documentation

## 2017-08-19 DIAGNOSIS — Z90710 Acquired absence of both cervix and uterus: Secondary | ICD-10-CM | POA: Diagnosis not present

## 2017-08-19 DIAGNOSIS — Z9049 Acquired absence of other specified parts of digestive tract: Secondary | ICD-10-CM | POA: Insufficient documentation

## 2017-08-19 DIAGNOSIS — E1122 Type 2 diabetes mellitus with diabetic chronic kidney disease: Secondary | ICD-10-CM | POA: Insufficient documentation

## 2017-08-19 DIAGNOSIS — E1140 Type 2 diabetes mellitus with diabetic neuropathy, unspecified: Secondary | ICD-10-CM | POA: Insufficient documentation

## 2017-08-19 DIAGNOSIS — M19011 Primary osteoarthritis, right shoulder: Secondary | ICD-10-CM | POA: Insufficient documentation

## 2017-08-19 DIAGNOSIS — Z992 Dependence on renal dialysis: Secondary | ICD-10-CM | POA: Insufficient documentation

## 2017-08-19 DIAGNOSIS — N2581 Secondary hyperparathyroidism of renal origin: Secondary | ICD-10-CM | POA: Insufficient documentation

## 2017-08-19 DIAGNOSIS — E890 Postprocedural hypothyroidism: Secondary | ICD-10-CM | POA: Insufficient documentation

## 2017-08-19 DIAGNOSIS — N186 End stage renal disease: Secondary | ICD-10-CM

## 2017-08-19 DIAGNOSIS — Z794 Long term (current) use of insulin: Secondary | ICD-10-CM | POA: Diagnosis not present

## 2017-08-19 DIAGNOSIS — Z833 Family history of diabetes mellitus: Secondary | ICD-10-CM | POA: Diagnosis not present

## 2017-08-19 DIAGNOSIS — Z79899 Other long term (current) drug therapy: Secondary | ICD-10-CM | POA: Insufficient documentation

## 2017-08-19 DIAGNOSIS — Z9889 Other specified postprocedural states: Secondary | ICD-10-CM | POA: Insufficient documentation

## 2017-08-19 DIAGNOSIS — M19012 Primary osteoarthritis, left shoulder: Secondary | ICD-10-CM | POA: Insufficient documentation

## 2017-08-19 DIAGNOSIS — T82868A Thrombosis of vascular prosthetic devices, implants and grafts, initial encounter: Secondary | ICD-10-CM

## 2017-08-19 DIAGNOSIS — T82858A Stenosis of vascular prosthetic devices, implants and grafts, initial encounter: Secondary | ICD-10-CM | POA: Diagnosis not present

## 2017-08-19 DIAGNOSIS — E11319 Type 2 diabetes mellitus with unspecified diabetic retinopathy without macular edema: Secondary | ICD-10-CM | POA: Diagnosis not present

## 2017-08-19 DIAGNOSIS — I120 Hypertensive chronic kidney disease with stage 5 chronic kidney disease or end stage renal disease: Secondary | ICD-10-CM | POA: Diagnosis not present

## 2017-08-19 HISTORY — PX: A/V FISTULAGRAM: CATH118298

## 2017-08-19 HISTORY — DX: Zoster without complications: B02.9

## 2017-08-19 LAB — GLUCOSE, CAPILLARY
GLUCOSE-CAPILLARY: 135 mg/dL — AB (ref 65–99)
GLUCOSE-CAPILLARY: 159 mg/dL — AB (ref 65–99)

## 2017-08-19 LAB — POTASSIUM (ARMC VASCULAR LAB ONLY): POTASSIUM (ARMC VASCULAR LAB): 3.8 (ref 3.5–5.1)

## 2017-08-19 SURGERY — A/V FISTULAGRAM
Anesthesia: Moderate Sedation | Site: Arm Upper | Laterality: Left

## 2017-08-19 MED ORDER — HYDROMORPHONE HCL 1 MG/ML IJ SOLN: 1.0000 mg | Freq: Once | INTRAMUSCULAR | Status: DC | PRN

## 2017-08-19 MED ORDER — SODIUM CHLORIDE 0.9 % IV SOLN
INTRAVENOUS | Status: DC
  Administered 2017-08-19: 07:00:00 via INTRAVENOUS

## 2017-08-19 MED ORDER — HEPARIN SODIUM (PORCINE) 1000 UNIT/ML IJ SOLN
INTRAMUSCULAR | Status: DC | PRN
  Administered 2017-08-19: 3000 [IU] via INTRAVENOUS

## 2017-08-19 MED ORDER — MIDAZOLAM HCL 2 MG/2ML IJ SOLN
INTRAMUSCULAR | Status: DC | PRN
  Administered 2017-08-19: 2 mg via INTRAVENOUS

## 2017-08-19 MED ORDER — MIDAZOLAM HCL 5 MG/5ML IJ SOLN
INTRAMUSCULAR | Status: AC
  Filled 2017-08-19: qty 5

## 2017-08-19 MED ORDER — HEPARIN SODIUM (PORCINE) 1000 UNIT/ML IJ SOLN
INTRAMUSCULAR | Status: AC
  Filled 2017-08-19: qty 1

## 2017-08-19 MED ORDER — LIDOCAINE-EPINEPHRINE (PF) 1 %-1:200000 IJ SOLN
INTRAMUSCULAR | Status: AC
  Filled 2017-08-19: qty 30

## 2017-08-19 MED ORDER — METHYLPREDNISOLONE SODIUM SUCC 125 MG IJ SOLR: 125.0000 mg | INTRAMUSCULAR | Status: DC | PRN

## 2017-08-19 MED ORDER — IOPAMIDOL (ISOVUE-300) INJECTION 61%
INTRAVENOUS | Status: DC | PRN
  Administered 2017-08-19: 25 mL via INTRAVENOUS

## 2017-08-19 MED ORDER — FENTANYL CITRATE (PF) 100 MCG/2ML IJ SOLN
INTRAMUSCULAR | Status: AC
  Filled 2017-08-19: qty 2

## 2017-08-19 MED ORDER — FENTANYL CITRATE (PF) 100 MCG/2ML IJ SOLN
INTRAMUSCULAR | Status: DC | PRN
Start: 2017-08-19 — End: 2017-08-19
  Administered 2017-08-19: 50 ug via INTRAVENOUS

## 2017-08-19 MED ORDER — ONDANSETRON HCL 4 MG/2ML IJ SOLN: 4.0000 mg | Freq: Four times a day (QID) | INTRAMUSCULAR | Status: DC | PRN

## 2017-08-19 MED ORDER — HEPARIN (PORCINE) IN NACL 2-0.9 UNIT/ML-% IJ SOLN
INTRAMUSCULAR | Status: AC
  Filled 2017-08-19: qty 1000

## 2017-08-19 MED ORDER — FAMOTIDINE 20 MG PO TABS: 40.0000 mg | ORAL_TABLET | ORAL | Status: DC | PRN

## 2017-08-19 SURGICAL SUPPLY — 9 items
BALLN LUTONIX DCB 7X60X130 (BALLOONS) ×3
BALLOON LUTONIX DCB 7X60X130 (BALLOONS) ×1 IMPLANT
CANNULA 5F STIFF (CANNULA) ×3 IMPLANT
DEVICE PRESTO INFLATION (MISCELLANEOUS) ×3 IMPLANT
DRAPE BRACHIAL (DRAPES) ×3 IMPLANT
PACK ANGIOGRAPHY (CUSTOM PROCEDURE TRAY) ×3 IMPLANT
SHEATH BRITE TIP 6FRX5.5 (SHEATH) ×3 IMPLANT
SUT MNCRL AB 4-0 PS2 18 (SUTURE) ×3 IMPLANT
WIRE MAGIC TORQUE 260C (WIRE) ×3 IMPLANT

## 2017-08-19 NOTE — Progress Notes (Signed)
Pt sitting up in bed eating and drinking w/o difficulty at this time.  Pt family at bedside.  Discharge and follow up instructions reviewed with pt and family who verbalize understanding

## 2017-08-19 NOTE — Discharge Instructions (Signed)

## 2017-08-19 NOTE — Op Note (Signed)
[facility]    OPERATIVE NOTE   PROCEDURE: 1.   Left brachiocephalic arteriovenous fistula cannulation under ultrasound guidance 2.   Left arm fistulagram including central venogram 3.   Percutaneous transluminal angioplasty of the mid upper arm cephalic vein with 7 mm diameter by 6 cm length Lutonix drug-coated angioplasty balloon  PRE-OPERATIVE DIAGNOSIS: 1. ESRD 2. Poorly functional left brachiocephalic AVF  POST-OPERATIVE DIAGNOSIS: same as above   SURGEON: Leotis Pain, MD  ANESTHESIA: local with MCS  ESTIMATED BLOOD LOSS: 5 cc  FINDING(S): 1. 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  Mild stenosis near the arterial anastomosis that appeared to be less than 50%.  Remainder of the AV fistula and central venous circulation was widely patent.  SPECIMEN(S):  None  CONTRAST: 25 cc  FLUORO TIME: 0.4 minutes  MODERATE CONSCIOUS SEDATION TIME: Approximately 15 minutes with 2 mg of Versed and 50 Mcg of Fentanyl   INDICATIONS: Kendra [name] is a 66 y.o. female who presents with malfunctioning left brachiocephalic arteriovenous fistula.  The patient is scheduled for left arm fistulagram.  The patient is aware the risks include but are not limited to: bleeding, infection, thrombosis of the cannulated access, and possible anaphylactic reaction to the contrast.  The patient is aware of the risks of the procedure and elects to proceed forward.  DESCRIPTION: After full informed written consent was obtained, the patient was brought back to the angiography suite and placed supine upon the angiography table.  The patient was connected to monitoring equipment. Moderate conscious sedation was administered with a face to face encounter with the patient throughout the procedure with my supervision of the RN administering medicines and monitoring the patient's vital signs and mental status  throughout from the start of the procedure until the patient was taken to the recovery room. The left arm was prepped and draped in the standard fashion for a percutaneous access intervention.  Under ultrasound guidance, the left brachiocephalic arteriovenous fistula was cannulated with a micropuncture needle under direct ultrasound guidance near the arterial to venous anastomosis in an antegrade fashion and a permanent image was performed.  The microwire was advanced into the fistula and the needle was exchanged for the a microsheath.  I then upsized to a 6 Fr Sheath and imaging was performed.  Hand injections were completed to image the access including the central venous system. This demonstrated high-grade near occlusive stenosis in the mid upper arm cephalic vein at and just proximal to the venous access sites.  Mild stenosis near the arterial anastomosis that appeared to be less than 50%.  Remainder of the AV fistula and central venous circulation was widely patent.  Based on the images, this patient will need intervention to this area in the mid upper arm cephalic vein. I then gave the patient 3000 units of intravenous heparin.  I then crossed the stenosis with a Magic Tourqe wire.  Based on the imaging, a 7 mm x 6 cm Lutonix drug-coated angioplasty balloon was selected.  The balloon was centered around the mid upper arm cephalic vein stenosis and inflated to 14 ATM for 1 minute(s).  On completion imaging, a 10-15 % residual stenosis was present.     Based on the completion imaging, no further intervention is necessary.  The wire and balloon were removed from the sheath.  A 4-0 Monocryl purse-string suture was sewn around the sheath.  The sheath  was removed while tying down the suture.  A sterile bandage was applied to the puncture site.  COMPLICATIONS: None  CONDITION: Stable   Leotis Pain  08/19/2017 8:33 AM   This note was created with Dragon Medical transcription system. Any errors in  dictation are purely unintentional.

## 2017-08-19 NOTE — H&P (Signed)
[facility] History & Physical Update  The patient was interviewed and re-examined.  The patient's previous History and Physical has been reviewed and is unchanged.  There is no change in the plan of care. We plan to proceed with the scheduled procedure.  Leotis Pain, MD  08/19/2017, 8:06 AM

## 2017-08-20 ENCOUNTER — Encounter: Payer: Self-pay | Admitting: Vascular Surgery

## 2017-09-02 ENCOUNTER — Other Ambulatory Visit: Payer: Self-pay | Admitting: Internal Medicine

## 2017-09-02 DIAGNOSIS — Z1231 Encounter for screening mammogram for malignant neoplasm of breast: Secondary | ICD-10-CM

## 2017-09-28 ENCOUNTER — Encounter (INDEPENDENT_AMBULATORY_CARE_PROVIDER_SITE_OTHER): Payer: Medicare Other | Admitting: Vascular Surgery

## 2017-09-30 ENCOUNTER — Encounter: Payer: Self-pay | Admitting: *Deleted

## 2017-10-07 ENCOUNTER — Encounter: Payer: Self-pay | Admitting: Anesthesiology

## 2017-10-07 ENCOUNTER — Encounter
Admission: RE | Disposition: A | Discharge status: Home / Self Care | Payer: Self-pay | Source: Ambulatory Visit | Attending: Ophthalmology

## 2017-10-07 ENCOUNTER — Ambulatory Visit: Payer: Medicare Other | Admitting: Anesthesiology

## 2017-10-07 ENCOUNTER — Ambulatory Visit
Admission: RE | Admit: 2017-10-07 | Discharge: 2017-10-07 | Disposition: A | Discharge status: Home / Self Care | Payer: Medicare Other | Source: Ambulatory Visit | Attending: Ophthalmology | Admitting: Ophthalmology

## 2017-10-07 DIAGNOSIS — Z7902 Long term (current) use of antithrombotics/antiplatelets: Secondary | ICD-10-CM | POA: Diagnosis not present

## 2017-10-07 DIAGNOSIS — Z794 Long term (current) use of insulin: Secondary | ICD-10-CM | POA: Diagnosis not present

## 2017-10-07 DIAGNOSIS — H2511 Age-related nuclear cataract, right eye: Secondary | ICD-10-CM | POA: Insufficient documentation

## 2017-10-07 DIAGNOSIS — I10 Essential (primary) hypertension: Secondary | ICD-10-CM | POA: Diagnosis not present

## 2017-10-07 DIAGNOSIS — M19012 Primary osteoarthritis, left shoulder: Secondary | ICD-10-CM | POA: Insufficient documentation

## 2017-10-07 DIAGNOSIS — K219 Gastro-esophageal reflux disease without esophagitis: Secondary | ICD-10-CM | POA: Insufficient documentation

## 2017-10-07 DIAGNOSIS — E1140 Type 2 diabetes mellitus with diabetic neuropathy, unspecified: Secondary | ICD-10-CM | POA: Diagnosis not present

## 2017-10-07 DIAGNOSIS — E11319 Type 2 diabetes mellitus with unspecified diabetic retinopathy without macular edema: Secondary | ICD-10-CM | POA: Insufficient documentation

## 2017-10-07 DIAGNOSIS — E039 Hypothyroidism, unspecified: Secondary | ICD-10-CM | POA: Diagnosis not present

## 2017-10-07 DIAGNOSIS — N2581 Secondary hyperparathyroidism of renal origin: Secondary | ICD-10-CM | POA: Insufficient documentation

## 2017-10-07 DIAGNOSIS — Z79899 Other long term (current) drug therapy: Secondary | ICD-10-CM | POA: Diagnosis not present

## 2017-10-07 DIAGNOSIS — M1990 Unspecified osteoarthritis, unspecified site: Secondary | ICD-10-CM | POA: Diagnosis not present

## 2017-10-07 DIAGNOSIS — M19011 Primary osteoarthritis, right shoulder: Secondary | ICD-10-CM | POA: Insufficient documentation

## 2017-10-07 DIAGNOSIS — Z8619 Personal history of other infectious and parasitic diseases: Secondary | ICD-10-CM | POA: Insufficient documentation

## 2017-10-07 HISTORY — PX: CATARACT EXTRACTION W/PHACO: SHX586

## 2017-10-07 LAB — POCT I-STAT 4, (NA,K, GLUC, HGB,HCT)
GLUCOSE: 126 mg/dL — AB (ref 65–99)
HEMATOCRIT: 35 % — AB (ref 36.0–46.0)
Hemoglobin: 11.9 g/dL — ABNORMAL LOW (ref 12.0–15.0)
Potassium: 4.1 mmol/L (ref 3.5–5.1)
SODIUM: 140 mmol/L (ref 135–145)

## 2017-10-07 LAB — GLUCOSE, CAPILLARY: GLUCOSE-CAPILLARY: 121 mg/dL — AB (ref 65–99)

## 2017-10-07 SURGERY — PHACOEMULSIFICATION, CATARACT, WITH IOL INSERTION
Anesthesia: Monitor Anesthesia Care | Site: Eye | Laterality: Right | Wound class: Clean

## 2017-10-07 MED ORDER — CARBACHOL 0.01 % IO SOLN
INTRAOCULAR | Status: DC | PRN
  Administered 2017-10-07: 0.5 mL via INTRAOCULAR

## 2017-10-07 MED ORDER — LIDOCAINE HCL (PF) 4 % IJ SOLN
INTRAMUSCULAR | Status: AC
Start: 2017-10-07 — End: 2017-10-07
  Filled 2017-10-07: qty 5

## 2017-10-07 MED ORDER — SODIUM CHLORIDE 0.9 % IV SOLN
INTRAVENOUS | Status: DC
  Administered 2017-10-07: 07:00:00 via INTRAVENOUS

## 2017-10-07 MED ORDER — EPINEPHRINE PF 1 MG/ML IJ SOLN
INTRAMUSCULAR | Status: AC
  Filled 2017-10-07: qty 1

## 2017-10-07 MED ORDER — SODIUM HYALURONATE 23 MG/ML IO SOLN
INTRAOCULAR | Status: AC
  Filled 2017-10-07: qty 0.6

## 2017-10-07 MED ORDER — MOXIFLOXACIN HCL 0.5 % OP SOLN: 1.0000 [drp] | OPHTHALMIC | Status: DC | PRN

## 2017-10-07 MED ORDER — POVIDONE-IODINE 5 % OP SOLN
OPHTHALMIC | Status: AC
  Filled 2017-10-07: qty 30

## 2017-10-07 MED ORDER — ARMC OPHTHALMIC DILATING DROPS
1.0000 "application " | OPHTHALMIC | Status: AC
  Administered 2017-10-07 (×3): 1 via OPHTHALMIC

## 2017-10-07 MED ORDER — MIDAZOLAM HCL 2 MG/2ML IJ SOLN
INTRAMUSCULAR | Status: DC | PRN
  Administered 2017-10-07 (×2): 1 mg via INTRAVENOUS

## 2017-10-07 MED ORDER — SODIUM HYALURONATE 23 MG/ML IO SOLN
INTRAOCULAR | Status: DC | PRN
  Administered 2017-10-07: 0.6 mL via INTRAOCULAR

## 2017-10-07 MED ORDER — SODIUM HYALURONATE 10 MG/ML IO SOLN
INTRAOCULAR | Status: DC | PRN
  Administered 2017-10-07: 0.55 mL via INTRAOCULAR

## 2017-10-07 MED ORDER — ARMC OPHTHALMIC DILATING DROPS
OPHTHALMIC | Status: AC
  Administered 2017-10-07: 1 via OPHTHALMIC
  Filled 2017-10-07: qty 0.4

## 2017-10-07 MED ORDER — MOXIFLOXACIN HCL 0.5 % OP SOLN
OPHTHALMIC | Status: AC
  Filled 2017-10-07: qty 3

## 2017-10-07 MED ORDER — FENTANYL CITRATE (PF) 100 MCG/2ML IJ SOLN
INTRAMUSCULAR | Status: AC
  Filled 2017-10-07: qty 2

## 2017-10-07 MED ORDER — FENTANYL CITRATE (PF) 100 MCG/2ML IJ SOLN
INTRAMUSCULAR | Status: DC | PRN
  Administered 2017-10-07: 50 ug via INTRAVENOUS

## 2017-10-07 MED ORDER — MIDAZOLAM HCL 2 MG/2ML IJ SOLN
INTRAMUSCULAR | Status: AC
  Filled 2017-10-07: qty 2

## 2017-10-07 MED ORDER — POVIDONE-IODINE 5 % OP SOLN
OPHTHALMIC | Status: DC | PRN
  Administered 2017-10-07: 1 via OPHTHALMIC

## 2017-10-07 MED ORDER — LIDOCAINE HCL (PF) 4 % IJ SOLN
INTRAOCULAR | Status: DC | PRN
  Administered 2017-10-07: 4 mL via OPHTHALMIC

## 2017-10-07 MED ORDER — EPINEPHRINE PF 1 MG/ML IJ SOLN
INTRAMUSCULAR | Status: DC | PRN
  Administered 2017-10-07: 08:00:00 via OPHTHALMIC

## 2017-10-07 MED ORDER — MOXIFLOXACIN HCL 0.5 % OP SOLN
OPHTHALMIC | Status: DC | PRN
  Administered 2017-10-07: 0.2 mL via OPHTHALMIC

## 2017-10-07 SURGICAL SUPPLY — 16 items
DISSECTOR HYDRO NUCLEUS 50X22 (MISCELLANEOUS) ×3 IMPLANT
GLOVE BIO SURGEON STRL SZ8 (GLOVE) ×3 IMPLANT
GLOVE BIOGEL M 6.5 STRL (GLOVE) ×3 IMPLANT
GLOVE SURG LX 7.5 STRW (GLOVE) ×2
GLOVE SURG LX STRL 7.5 STRW (GLOVE) ×1 IMPLANT
GOWN STRL REUS W/ TWL LRG LVL3 (GOWN DISPOSABLE) ×2 IMPLANT
GOWN STRL REUS W/TWL LRG LVL3 (GOWN DISPOSABLE) ×4
LABEL CATARACT MEDS ST (LABEL) ×3 IMPLANT
LENS IOL TECNIS ITEC 23.0 (Intraocular Lens) ×3 IMPLANT
PACK CATARACT (MISCELLANEOUS) ×3 IMPLANT
PACK CATARACT KING (MISCELLANEOUS) ×3 IMPLANT
PACK EYE AFTER SURG (MISCELLANEOUS) ×3 IMPLANT
SOL BSS BAG (MISCELLANEOUS) ×3
SOLUTION BSS BAG (MISCELLANEOUS) ×1 IMPLANT
WATER STERILE IRR 250ML POUR (IV SOLUTION) ×3 IMPLANT
WIPE NON LINTING 3.25X3.25 (MISCELLANEOUS) ×3 IMPLANT

## 2017-10-07 NOTE — Transfer of Care (Signed)
Immediate Anesthesia Transfer of Care Note  Patient: Kendra [name]  Procedure(s) Performed: CATARACT EXTRACTION PHACO AND INTRAOCULAR LENS PLACEMENT (IOC)-RIGHT DIABETIC (Right Eye)  Patient Location: PACU  Anesthesia Type:General  Level of Consciousness: awake, alert  and oriented  Airway & Oxygen Therapy: Patient Spontanous Breathing  Post-op Assessment: Report given to RN and Post -op Vital signs reviewed and stable  Post vital signs: Reviewed and stable  Last Vitals:  Vitals:   10/07/17 0636  BP: 106/73  Pulse: 71  Resp: 17  Temp: 36.6 C  SpO2: 100%    Last Pain:  Vitals:   10/07/17 0636  TempSrc: Tympanic         Complications: No apparent anesthesia complications

## 2017-10-07 NOTE — H&P (Signed)
The History and Physical notes are on paper, have been signed, and are to be scanned.   I have examined the patient and there are no changes to the H&P.   Benay Pillow 10/07/2017 8:02 AM

## 2017-10-07 NOTE — Anesthesia Postprocedure Evaluation (Signed)
Anesthesia Post Note  Patient: Kendra [name]  Procedure(s) Performed: CATARACT EXTRACTION PHACO AND INTRAOCULAR LENS PLACEMENT (IOC)-RIGHT DIABETIC (Right Eye)  Patient location during evaluation: PACU Anesthesia Type: MAC Level of consciousness: awake and alert Pain management: pain level controlled Vital Signs Assessment: post-procedure vital signs reviewed and stable Respiratory status: spontaneous breathing, nonlabored ventilation, respiratory function stable and patient connected to nasal cannula oxygen Cardiovascular status: stable and blood pressure returned to baseline Postop Assessment: no apparent nausea or vomiting Anesthetic complications: no     Last Vitals:  Vitals:   10/07/17 0636 10/07/17 0849  BP: 106/73 (!) 140/56  Pulse: 71 68  Resp: 17 16  Temp: 36.6 C   SpO2: 100% 100%    Last Pain:  Vitals:   10/07/17 0636  TempSrc: Tympanic                 Precious Haws Rylinn Linzy

## 2017-10-07 NOTE — Op Note (Signed)
OPERATIVE NOTE  Kendra [name] [id number] 10/07/2017   PREOPERATIVE DIAGNOSIS:  Nuclear sclerotic cataract right eye.  H25.11   POSTOPERATIVE DIAGNOSIS:    Nuclear sclerotic cataract right eye.     PROCEDURE:  Phacoemusification with posterior chamber intraocular lens placement of the right eye   LENS:   Implant Name Type Inv. Item Serial No. Manufacturer Lot No. LRB No. Used  LENS IOL DIOP 23.0 - [id number] Intraocular Lens LENS IOL DIOP 23.0 [id number]  Right 1       PCB00 +23.0   ULTRASOUND TIME: 0 minutes 30.8 seconds.  CDE 2.75   SURGEON:  Benay Pillow, MD, MPH  ANESTHESIOLOGIST: Anesthesiologist: Piscitello, Precious Haws, MD CRNA: Vaughan Sine   ANESTHESIA:  Topical with tetracaine drops augmented with 1% preservative-free intracameral lidocaine.  ESTIMATED BLOOD LOSS: less than 1 mL.   COMPLICATIONS:  None.   DESCRIPTION OF PROCEDURE:  The patient was identified in the holding room and transported to the operating room and placed in the supine position under the operating microscope.  The right eye was identified as the operative eye and it was prepped and draped in the usual sterile ophthalmic fashion.   A 1.0 millimeter clear-corneal paracentesis was made at the 10:30 position. 0.5 ml of preservative-free 1% lidocaine with epinephrine was injected into the anterior chamber.  The anterior chamber was filled with Healon 5 viscoelastic.  A 2.4 millimeter keratome was used to make a near-clear corneal incision at the 8:00 position.  A curvilinear capsulorrhexis was made with a cystotome and capsulorrhexis forceps.  Balanced salt solution was used to hydrodissect and hydrodelineate the nucleus.   Phacoemulsification was then used in stop and chop fashion to remove the lens nucleus and epinucleus.  The remaining cortex was then removed using the irrigation and aspiration handpiece. Healon was then placed into the capsular bag to distend it for lens placement.  A  lens was then injected into the capsular bag.  The remaining viscoelastic was aspirated.   Wounds were hydrated with balanced salt solution.  The anterior chamber was inflated to a physiologic pressure with balanced salt solution.   Intracameral vigamox 0.1 mL undiluted was injected into the eye and a drop placed onto the ocular surface.  No wound leaks were noted.  The patient was taken to the recovery room in stable condition without complications of anesthesia or surgery  Benay Pillow 10/07/2017, 8:35 AM

## 2017-10-07 NOTE — Anesthesia Post-op Follow-up Note (Signed)
Anesthesia QCDR form completed.        

## 2017-10-07 NOTE — Anesthesia Preprocedure Evaluation (Signed)
Anesthesia Evaluation  Patient identified by MRN, date of birth, ID band Patient awake    Reviewed: Allergy & Precautions, H&P , NPO status , Patient's Chart, lab work & pertinent test results  History of Anesthesia Complications Negative for: history of anesthetic complications  Airway Mallampati: III  TM Distance: <3 FB Neck ROM: limited    Dental  (+) Chipped, Poor Dentition   Pulmonary neg pulmonary ROS, neg shortness of breath,           Cardiovascular Exercise Tolerance: Good hypertension, (-) angina(-) Past MI and (-) DOE      Neuro/Psych negative neurological ROS  negative psych ROS   GI/Hepatic Neg liver ROS, GERD  ,  Endo/Other  diabetes, Type 2, Insulin DependentHypothyroidism   Renal/GU DialysisRenal disease     Musculoskeletal  (+) Arthritis ,   Abdominal   Peds  Hematology negative hematology ROS (+)   Anesthesia Other Findings Past Medical History: No date: Anemia of chronic renal failure     Comment:  DIALYSIS No date: Diabetic neuropathy (HCC) No date: Diabetic retinopathy (HCC) No date: GERD (gastroesophageal reflux disease) No date: Hyperparathyroidism, secondary renal (HCC) No date: Hypertension No date: Hypothyroidism No date: Insulin dependent diabetes mellitus (HCC) No date: Osteoarthritis     Comment:  bilateral shoulders No date: Primary hypertension 04/2017: Shingles outbreak  Past Surgical History: 08/19/2017: A/V FISTULAGRAM; Left     Comment:  Procedure: A/V Fistulagram;  Surgeon: Algernon Huxley, MD;               Location: [facility];  Service: Cardiovascular;              Laterality: Left; No date: ABDOMINAL HYSTERECTOMY 4/69/6295: [place]; Left     Comment:  Procedure: BASCILIC VEIN TRANSPOSITION ( STAGE 1 );                Surgeon: Algernon Huxley, MD;  Location: [facility];  Service:               Vascular;  Laterality: Left; No date:  CHOLECYSTECTOMY 01/22/2016: PERIPHERAL VASCULAR CATHETERIZATION; N/A     Comment:  Procedure: Dialysis/Perma Catheter Insertion;  Surgeon:               Katha Cabal, MD;  Location: [facility];                Service: Cardiovascular;  Laterality: N/A; 05/14/2016: PERIPHERAL VASCULAR CATHETERIZATION; Left     Comment:  Procedure: A/V Shuntogram/Fistulagram;  Surgeon: Algernon Huxley, MD;  Location: [facility];  Service:               Cardiovascular;  Laterality: Left; 05/14/2016: PERIPHERAL VASCULAR CATHETERIZATION; N/A     Comment:  Procedure: A/V Shunt Intervention;  Surgeon: Algernon Huxley, MD;  Location: [facility];  Service:               Cardiovascular;  Laterality: N/A; 06/25/2016: PERIPHERAL VASCULAR CATHETERIZATION; N/A     Comment:  Procedure: Dialysis/Perma Catheter Removal;  Surgeon:               Algernon Huxley, MD;  Location: [facility];                Service: Cardiovascular;  Laterality: N/A; 08/03/2016: PERIPHERAL VASCULAR CATHETERIZATION; Left     Comment:  Procedure: A/V Shuntogram/Fistulagram;  Surgeon: Algernon Huxley, MD;  Location: [facility];  Service:               Cardiovascular;  Laterality: Left; 08/03/2016: PERIPHERAL VASCULAR CATHETERIZATION; N/A     Comment:  Procedure: A/V Shunt Intervention;  Surgeon: Algernon Huxley, MD;  Location: [facility];  Service:               Cardiovascular;  Laterality: N/A; 08/07/2016: PERIPHERAL VASCULAR CATHETERIZATION; Left     Comment:  Procedure: A/V Shuntogram/Fistulagram;  Surgeon: Katha Cabal, MD;  Location: [facility];  Service:              Cardiovascular;  Laterality: Left; 09/22/2016: PERIPHERAL VASCULAR CATHETERIZATION; Left     Comment:  Procedure: A/V Fistulagram;  Surgeon: Katha Cabal,              MD;  Location: [place];  Service:               Cardiovascular;   Laterality: Left; No date: THYROIDECTOMY  BMI    Body Mass Index:  35.51 kg/m      Reproductive/Obstetrics negative OB ROS                             Anesthesia Physical Anesthesia Plan  ASA: III  Anesthesia Plan: MAC   Post-op Pain Management:    Induction: Intravenous  PONV Risk Score and Plan:   Airway Management Planned: Natural Airway and Nasal Cannula  Additional Equipment:   Intra-op Plan:   Post-operative Plan:   Informed Consent: I have reviewed the patients History and Physical, chart, labs and discussed the procedure including the risks, benefits and alternatives for the proposed anesthesia with the patient or authorized representative who has indicated his/her understanding and acceptance.   Dental Advisory Given  Plan Discussed with: Anesthesiologist, CRNA and Surgeon  Anesthesia Plan Comments: (Patient consented for risks of anesthesia including but not limited to:  - adverse reactions to medications - damage to teeth, lips or other oral mucosa - sore throat or hoarseness - Damage to heart, brain, lungs or loss of life  Patient voiced understanding.)        Anesthesia Quick Evaluation

## 2017-10-07 NOTE — Anesthesia Procedure Notes (Signed)
Performed by: Cook-Martin, Kayle Correa Pre-anesthesia Checklist: Patient identified, Emergency Drugs available, Suction available, Patient being monitored and Timeout performed Patient Re-evaluated:Patient Re-evaluated prior to induction Oxygen Delivery Method: Nasal cannula Preoxygenation: Pre-oxygenation with 100% oxygen Induction Type: IV induction Placement Confirmation: positive ETCO2 and CO2 detector       

## 2017-10-07 NOTE — Discharge Instructions (Signed)
Eye Surgery Discharge Instructions  Expect mild scratchy sensation or mild soreness. DO NOT RUB YOUR EYE!  The day of surgery:  Minimal physical activity, but bed rest is not required  No reading, computer work, or close hand work  No bending, lifting, or straining.  May watch TV  For 24 hours:  No driving, legal decisions, or alcoholic beverages  Safety precautions  Eat anything you prefer: It is better to start with liquids, then soup then solid foods.  _____ Eye patch should be worn until postoperative exam tomorrow.  ____ Solar shield eyeglasses should be worn for comfort in the sunlight/patch while sleeping  Resume all regular medications including aspirin or Coumadin if these were discontinued prior to surgery. You may shower, bathe, shave, or wash your hair. Tylenol may be taken for mild discomfort.  Call your doctor if you experience significant pain, nausea, or vomiting, fever > 101 or other signs of infection. [phone] or [phone] Specific instructions:  Follow-up Information    Eulogio Bear, MD Follow up on 10/08/2017.   Specialty:  Ophthalmology Why:  9:00 Contact information: [place] [address] [phone]

## 2017-10-26 ENCOUNTER — Encounter: Payer: Self-pay | Admitting: *Deleted

## 2017-10-26 ENCOUNTER — Ambulatory Visit
Admission: RE | Admit: 2017-10-26 | Discharge: 2017-10-26 | Disposition: A | Discharge status: Home / Self Care | Payer: Medicare Other | Source: Ambulatory Visit | Attending: Internal Medicine | Admitting: Internal Medicine

## 2017-10-26 DIAGNOSIS — Z1231 Encounter for screening mammogram for malignant neoplasm of breast: Secondary | ICD-10-CM

## 2017-11-04 ENCOUNTER — Ambulatory Visit: Payer: Medicare Other | Admitting: Anesthesiology

## 2017-11-04 ENCOUNTER — Ambulatory Visit
Admission: RE | Admit: 2017-11-04 | Discharge: 2017-11-04 | Disposition: A | Discharge status: Home / Self Care | Payer: Medicare Other | Source: Ambulatory Visit | Attending: Ophthalmology | Admitting: Ophthalmology

## 2017-11-04 ENCOUNTER — Encounter
Admission: RE | Disposition: A | Discharge status: Home / Self Care | Payer: Self-pay | Source: Ambulatory Visit | Attending: Ophthalmology

## 2017-11-04 DIAGNOSIS — Z79899 Other long term (current) drug therapy: Secondary | ICD-10-CM | POA: Insufficient documentation

## 2017-11-04 DIAGNOSIS — E119 Type 2 diabetes mellitus without complications: Secondary | ICD-10-CM | POA: Insufficient documentation

## 2017-11-04 DIAGNOSIS — D649 Anemia, unspecified: Secondary | ICD-10-CM | POA: Diagnosis not present

## 2017-11-04 DIAGNOSIS — I10 Essential (primary) hypertension: Secondary | ICD-10-CM | POA: Insufficient documentation

## 2017-11-04 DIAGNOSIS — I120 Hypertensive chronic kidney disease with stage 5 chronic kidney disease or end stage renal disease: Secondary | ICD-10-CM | POA: Insufficient documentation

## 2017-11-04 DIAGNOSIS — K219 Gastro-esophageal reflux disease without esophagitis: Secondary | ICD-10-CM | POA: Diagnosis not present

## 2017-11-04 DIAGNOSIS — E039 Hypothyroidism, unspecified: Secondary | ICD-10-CM | POA: Diagnosis not present

## 2017-11-04 DIAGNOSIS — Z992 Dependence on renal dialysis: Secondary | ICD-10-CM | POA: Diagnosis not present

## 2017-11-04 DIAGNOSIS — Z7902 Long term (current) use of antithrombotics/antiplatelets: Secondary | ICD-10-CM | POA: Diagnosis not present

## 2017-11-04 DIAGNOSIS — Z794 Long term (current) use of insulin: Secondary | ICD-10-CM | POA: Diagnosis not present

## 2017-11-04 DIAGNOSIS — E1122 Type 2 diabetes mellitus with diabetic chronic kidney disease: Secondary | ICD-10-CM | POA: Diagnosis not present

## 2017-11-04 DIAGNOSIS — N186 End stage renal disease: Secondary | ICD-10-CM | POA: Diagnosis not present

## 2017-11-04 DIAGNOSIS — H2512 Age-related nuclear cataract, left eye: Secondary | ICD-10-CM | POA: Insufficient documentation

## 2017-11-04 HISTORY — PX: CATARACT EXTRACTION W/PHACO: SHX586

## 2017-11-04 LAB — GLUCOSE, CAPILLARY: GLUCOSE-CAPILLARY: 125 mg/dL — AB (ref 65–99)

## 2017-11-04 LAB — POCT I-STAT 4, (NA,K, GLUC, HGB,HCT)
GLUCOSE: 120 mg/dL — AB (ref 65–99)
HEMATOCRIT: 39 % (ref 36.0–46.0)
Hemoglobin: 13.3 g/dL (ref 12.0–15.0)
POTASSIUM: 4.7 mmol/L (ref 3.5–5.1)
Sodium: 146 mmol/L — ABNORMAL HIGH (ref 135–145)

## 2017-11-04 SURGERY — PHACOEMULSIFICATION, CATARACT, WITH IOL INSERTION
Anesthesia: Monitor Anesthesia Care | Site: Eye | Laterality: Left | Wound class: Clean

## 2017-11-04 MED ORDER — LIDOCAINE HCL (PF) 4 % IJ SOLN
INTRAMUSCULAR | Status: AC
  Filled 2017-11-04: qty 5

## 2017-11-04 MED ORDER — MIDAZOLAM HCL 2 MG/2ML IJ SOLN
INTRAMUSCULAR | Status: AC
  Filled 2017-11-04: qty 2

## 2017-11-04 MED ORDER — MOXIFLOXACIN HCL 0.5 % OP SOLN
OPHTHALMIC | Status: DC | PRN
  Administered 2017-11-04: 0.2 mL via OPHTHALMIC

## 2017-11-04 MED ORDER — MIDAZOLAM HCL 2 MG/2ML IJ SOLN
INTRAMUSCULAR | Status: DC | PRN
  Administered 2017-11-04: .5 mg via INTRAVENOUS
  Administered 2017-11-04: 0.5 mg via INTRAVENOUS

## 2017-11-04 MED ORDER — SODIUM HYALURONATE 23 MG/ML IO SOLN
INTRAOCULAR | Status: DC | PRN
  Administered 2017-11-04: 0.6 mL via INTRAOCULAR

## 2017-11-04 MED ORDER — CARBACHOL 0.01 % IO SOLN
INTRAOCULAR | Status: DC | PRN
  Administered 2017-11-04: 0.5 mL via INTRAOCULAR

## 2017-11-04 MED ORDER — SODIUM HYALURONATE 23 MG/ML IO SOLN
INTRAOCULAR | Status: AC
  Filled 2017-11-04: qty 0.6

## 2017-11-04 MED ORDER — SODIUM CHLORIDE 0.9 % IV SOLN
INTRAVENOUS | Status: DC
  Administered 2017-11-04: 09:00:00 via INTRAVENOUS

## 2017-11-04 MED ORDER — MOXIFLOXACIN HCL 0.5 % OP SOLN
OPHTHALMIC | Status: AC
  Filled 2017-11-04: qty 3

## 2017-11-04 MED ORDER — LIDOCAINE HCL (PF) 4 % IJ SOLN
INTRAOCULAR | Status: DC | PRN
  Administered 2017-11-04: 4 mL via OPHTHALMIC

## 2017-11-04 MED ORDER — EPINEPHRINE PF 1 MG/ML IJ SOLN
INTRAMUSCULAR | Status: AC
  Filled 2017-11-04: qty 2

## 2017-11-04 MED ORDER — SODIUM HYALURONATE 10 MG/ML IO SOLN
INTRAOCULAR | Status: DC | PRN
  Administered 2017-11-04: 0.55 mL via INTRAOCULAR

## 2017-11-04 MED ORDER — ARMC OPHTHALMIC DILATING DROPS
1.0000 "application " | OPHTHALMIC | Status: AC
  Administered 2017-11-04 (×2): 1 via OPHTHALMIC
  Administered 2017-11-04: 09:00:00 via OPHTHALMIC

## 2017-11-04 MED ORDER — ARMC OPHTHALMIC DILATING DROPS
OPHTHALMIC | Status: AC
  Filled 2017-11-04: qty 0.4

## 2017-11-04 MED ORDER — POVIDONE-IODINE 5 % OP SOLN
OPHTHALMIC | Status: DC | PRN
  Administered 2017-11-04: 1 via OPHTHALMIC

## 2017-11-04 MED ORDER — MOXIFLOXACIN HCL 0.5 % OP SOLN: 1.0000 [drp] | OPHTHALMIC | Status: DC | PRN

## 2017-11-04 MED ORDER — FENTANYL CITRATE (PF) 100 MCG/2ML IJ SOLN
INTRAMUSCULAR | Status: AC
  Filled 2017-11-04: qty 2

## 2017-11-04 MED ORDER — POVIDONE-IODINE 5 % OP SOLN
OPHTHALMIC | Status: AC
  Filled 2017-11-04: qty 30

## 2017-11-04 MED ORDER — EPINEPHRINE PF 1 MG/ML IJ SOLN
INTRAOCULAR | Status: DC | PRN
  Administered 2017-11-04: 10:00:00 via OPHTHALMIC

## 2017-11-04 SURGICAL SUPPLY — 16 items
DISSECTOR HYDRO NUCLEUS 50X22 (MISCELLANEOUS) ×3 IMPLANT
GLOVE BIO SURGEON STRL SZ8 (GLOVE) ×3 IMPLANT
GLOVE BIOGEL M 6.5 STRL (GLOVE) ×3 IMPLANT
GLOVE SURG LX 7.5 STRW (GLOVE) ×2
GLOVE SURG LX STRL 7.5 STRW (GLOVE) ×1 IMPLANT
GOWN STRL REUS W/ TWL LRG LVL3 (GOWN DISPOSABLE) ×2 IMPLANT
GOWN STRL REUS W/TWL LRG LVL3 (GOWN DISPOSABLE) ×4
LABEL CATARACT MEDS ST (LABEL) ×3 IMPLANT
LENS IOL TECNIS ITEC 22.5 (Intraocular Lens) ×3 IMPLANT
PACK CATARACT (MISCELLANEOUS) ×3 IMPLANT
PACK CATARACT KING (MISCELLANEOUS) ×3 IMPLANT
PACK EYE AFTER SURG (MISCELLANEOUS) ×3 IMPLANT
SOL BSS BAG (MISCELLANEOUS) ×3
SOLUTION BSS BAG (MISCELLANEOUS) ×1 IMPLANT
WATER STERILE IRR 250ML POUR (IV SOLUTION) ×3 IMPLANT
WIPE NON LINTING 3.25X3.25 (MISCELLANEOUS) ×3 IMPLANT

## 2017-11-04 NOTE — Anesthesia Postprocedure Evaluation (Signed)
Anesthesia Post Note  Patient: Kendra [name]  Procedure(s) Performed: CATARACT EXTRACTION PHACO AND INTRAOCULAR LENS PLACEMENT (IOC) (Left Eye)  Patient location during evaluation: PACU Anesthesia Type: MAC Level of consciousness: awake and alert Pain management: pain level controlled Vital Signs Assessment: post-procedure vital signs reviewed and stable Respiratory status: spontaneous breathing, nonlabored ventilation, respiratory function stable and patient connected to nasal cannula oxygen Cardiovascular status: stable and blood pressure returned to baseline Postop Assessment: no apparent nausea or vomiting Anesthetic complications: no     Last Vitals:  Vitals:   11/04/17 0850 11/04/17 1046  BP: (!) 105/53 (!) 183/97  Pulse: 71 71  Resp: 18   Temp: 36.7 C (!) 35.6 C  SpO2: 100% 100%    Last Pain:  Vitals:   11/04/17 0850  TempSrc: Tympanic                 Harles Evetts S

## 2017-11-04 NOTE — Transfer of Care (Signed)
Immediate Anesthesia Transfer of Care Note  Patient: Kendra [name]  Procedure(s) Performed: CATARACT EXTRACTION PHACO AND INTRAOCULAR LENS PLACEMENT (IOC) (Left Eye)  Patient Location: PACU  Anesthesia Type:General  Level of Consciousness: awake, alert  and oriented  Airway & Oxygen Therapy: Patient Spontanous Breathing  Post-op Assessment: Report given to RN and Post -op Vital signs reviewed and stable  Post vital signs: Reviewed and stable  Last Vitals:  Vitals:   11/04/17 0850  BP: (!) 105/53  Pulse: 71  Resp: 18  Temp: 36.7 C  SpO2: 100%    Last Pain:  Vitals:   11/04/17 0850  TempSrc: Tympanic         Complications: No apparent anesthesia complications

## 2017-11-04 NOTE — Anesthesia Post-op Follow-up Note (Signed)
Anesthesia QCDR form completed.        

## 2017-11-04 NOTE — H&P (Signed)
The History and Physical notes are on paper, have been signed, and are to be scanned.   I have examined the patient and there are no changes to the H&P.   Benay Pillow 11/04/2017 10:10 AM

## 2017-11-04 NOTE — Anesthesia Preprocedure Evaluation (Signed)
Anesthesia Evaluation  Patient identified by MRN, date of birth, ID band Patient awake    Reviewed: Allergy & Precautions, NPO status , Patient's Chart, lab work & pertinent test results, reviewed documented beta blocker date and time   Airway Mallampati: III  TM Distance: >3 FB     Dental  (+) Chipped   Pulmonary           Cardiovascular hypertension, Pt. on medications and Pt. on home beta blockers      Neuro/Psych    GI/Hepatic GERD  Controlled,  Endo/Other  diabetes, Type 2Hypothyroidism   Renal/GU ESRFRenal disease     Musculoskeletal   Abdominal   Peds  Hematology  (+) anemia ,   Anesthesia Other Findings   Reproductive/Obstetrics                             Anesthesia Physical Anesthesia Plan  ASA: III  Anesthesia Plan: MAC   Post-op Pain Management:    Induction:   PONV Risk Score and Plan:   Airway Management Planned:   Additional Equipment:   Intra-op Plan:   Post-operative Plan:   Informed Consent: I have reviewed the patients History and Physical, chart, labs and discussed the procedure including the risks, benefits and alternatives for the proposed anesthesia with the patient or authorized representative who has indicated his/her understanding and acceptance.     Plan Discussed with: CRNA  Anesthesia Plan Comments:         Anesthesia Quick Evaluation

## 2017-11-04 NOTE — Op Note (Signed)
Kendra [name] [id number] 11/04/2017   PREOPERATIVE DIAGNOSIS:  Nuclear sclerotic cataract left eye.  H25.12   POSTOPERATIVE DIAGNOSIS:    Nuclear sclerotic cataract left eye.     PROCEDURE:  Phacoemusification with posterior chamber intraocular lens placement of the left eye   LENS:   Implant Name Type Inv. Item Serial No. Manufacturer Lot No. LRB No. Used  LENS IOL DIOP 22.5 - [id number] Intraocular Lens LENS IOL DIOP 22.5 [id number] AMO  Left 1       PCB00 +22.5   ULTRASOUND TIME: 0 minutes 30.0 seconds.  CDE 2.41   SURGEON:  Benay Pillow, MD, MPH   ANESTHESIA:  Topical with tetracaine drops augmented with 1% preservative-free intracameral lidocaine.  ESTIMATED BLOOD LOSS: <1 mL   COMPLICATIONS:  None.   DESCRIPTION OF PROCEDURE:  The patient was identified in the holding room and transported to the operating room and placed in the supine position under the operating microscope.  The left eye was identified as the Kendra eye and it was prepped and draped in the usual sterile ophthalmic fashion.   A 1.0 millimeter clear-corneal paracentesis was made at the 5:00 position. 0.5 ml of preservative-free 1% lidocaine with epinephrine was injected into the anterior chamber.  The anterior chamber was filled with Healon 5 viscoelastic.  A 2.4 millimeter keratome was used to make a near-clear corneal incision at the 2:00 position.  A curvilinear capsulorrhexis was made with a cystotome and capsulorrhexis forceps.  Balanced salt solution was used to hydrodissect and hydrodelineate the nucleus.   Phacoemulsification was then used in stop and chop fashion to remove the lens nucleus and epinucleus.  The remaining cortex was then removed using the irrigation and aspiration handpiece. Healon was then placed into the capsular bag to distend it for lens placement.  A lens was then injected into the capsular bag.  The remaining viscoelastic was aspirated.   Wounds were  hydrated with balanced salt solution.  The anterior chamber was inflated to a physiologic pressure with balanced salt solution.  Intracameral vigamox 0.1 mL undiltued was injected into the eye and a drop placed onto the ocular surface.  No wound leaks were noted.  The patient was taken to the recovery room in stable condition without complications of anesthesia or surgery  Benay Pillow 11/04/2017, 10:47 AM

## 2017-11-04 NOTE — Discharge Instructions (Signed)
Eye Surgery Discharge Instructions  Expect mild scratchy sensation or mild soreness. DO NOT RUB YOUR EYE!  The day of surgery:  Minimal physical activity, but bed rest is not required  No reading, computer work, or close hand work  No bending, lifting, or straining.  May watch TV  For 24 hours:  No driving, legal decisions, or alcoholic beverages  Safety precautions  Eat anything you prefer: It is better to start with liquids, then soup then solid foods.  _____ Eye patch should be worn until postoperative exam tomorrow.  ____ Solar shield eyeglasses should be worn for comfort in the sunlight/patch while sleeping  Resume all regular medications including aspirin or Coumadin if these were discontinued prior to surgery. You may shower, bathe, shave, or wash your hair. Tylenol may be taken for mild discomfort.  Call your doctor if you experience significant pain, nausea, or vomiting, fever > 101 or other signs of infection. [phone] or [phone] Specific instructions:  Follow-up Information    Eulogio Bear, MD Follow up.   Specialty:  Ophthalmology Why:  11/05/17 at 10:05 at [facility] information: [place] [address] [phone]          Eye Surgery Discharge Instructions  Expect mild scratchy sensation or mild soreness. DO NOT RUB YOUR EYE!  The day of surgery:  Minimal physical activity, but bed rest is not required  No reading, computer work, or close hand work  No bending, lifting, or straining.  May watch TV  For 24 hours:  No driving, legal decisions, or alcoholic beverages  Safety precautions  Eat anything you prefer: It is better to start with liquids, then soup then solid foods.  _____ Eye patch should be worn until postoperative exam tomorrow.  ____ Solar shield eyeglasses should be worn for comfort in the sunlight/patch while sleeping  Resume all regular medications including aspirin or  Coumadin if these were discontinued prior to surgery. You may shower, bathe, shave, or wash your hair. Tylenol may be taken for mild discomfort.  Call your doctor if you experience significant pain, nausea, or vomiting, fever > 101 or other signs of infection. [phone] or [phone] Specific instructions:  Follow-up Information    Eulogio Bear, MD Follow up.   Specialty:  Ophthalmology Why:  11/05/17 at 10:05 at [facility] information: [place] [address] [phone]

## 2017-11-05 ENCOUNTER — Encounter: Payer: Self-pay | Admitting: Ophthalmology

## 2018-02-28 ENCOUNTER — Encounter (INDEPENDENT_AMBULATORY_CARE_PROVIDER_SITE_OTHER): Payer: Self-pay

## 2018-03-08 ENCOUNTER — Other Ambulatory Visit (INDEPENDENT_AMBULATORY_CARE_PROVIDER_SITE_OTHER): Payer: Self-pay | Admitting: Vascular Surgery

## 2018-03-09 MED ORDER — CEFAZOLIN SODIUM-DEXTROSE 1-4 GM/50ML-% IV SOLN
1.0000 g | Freq: Once | INTRAVENOUS | Status: AC
  Administered 2018-03-10: 1 g via INTRAVENOUS

## 2018-03-10 ENCOUNTER — Encounter
Admission: RE | Disposition: A | Discharge status: Home / Self Care | Payer: Self-pay | Source: Ambulatory Visit | Attending: Vascular Surgery

## 2018-03-10 ENCOUNTER — Ambulatory Visit
Admission: RE | Admit: 2018-03-10 | Discharge: 2018-03-10 | Disposition: A | Discharge status: Home / Self Care | Payer: Medicare Other | Source: Ambulatory Visit | Attending: Vascular Surgery | Admitting: Vascular Surgery

## 2018-03-10 DIAGNOSIS — Z90710 Acquired absence of both cervix and uterus: Secondary | ICD-10-CM | POA: Insufficient documentation

## 2018-03-10 DIAGNOSIS — E039 Hypothyroidism, unspecified: Secondary | ICD-10-CM | POA: Insufficient documentation

## 2018-03-10 DIAGNOSIS — Z833 Family history of diabetes mellitus: Secondary | ICD-10-CM | POA: Insufficient documentation

## 2018-03-10 DIAGNOSIS — N186 End stage renal disease: Secondary | ICD-10-CM

## 2018-03-10 DIAGNOSIS — Z794 Long term (current) use of insulin: Secondary | ICD-10-CM | POA: Diagnosis not present

## 2018-03-10 DIAGNOSIS — I120 Hypertensive chronic kidney disease with stage 5 chronic kidney disease or end stage renal disease: Secondary | ICD-10-CM | POA: Insufficient documentation

## 2018-03-10 DIAGNOSIS — N2581 Secondary hyperparathyroidism of renal origin: Secondary | ICD-10-CM | POA: Diagnosis not present

## 2018-03-10 DIAGNOSIS — Z8619 Personal history of other infectious and parasitic diseases: Secondary | ICD-10-CM | POA: Diagnosis not present

## 2018-03-10 DIAGNOSIS — I10 Essential (primary) hypertension: Secondary | ICD-10-CM | POA: Diagnosis not present

## 2018-03-10 DIAGNOSIS — Z9841 Cataract extraction status, right eye: Secondary | ICD-10-CM | POA: Insufficient documentation

## 2018-03-10 DIAGNOSIS — Z9049 Acquired absence of other specified parts of digestive tract: Secondary | ICD-10-CM | POA: Insufficient documentation

## 2018-03-10 DIAGNOSIS — Z841 Family history of disorders of kidney and ureter: Secondary | ICD-10-CM | POA: Insufficient documentation

## 2018-03-10 DIAGNOSIS — E1122 Type 2 diabetes mellitus with diabetic chronic kidney disease: Secondary | ICD-10-CM | POA: Diagnosis not present

## 2018-03-10 DIAGNOSIS — Z9889 Other specified postprocedural states: Secondary | ICD-10-CM | POA: Diagnosis not present

## 2018-03-10 DIAGNOSIS — Z9842 Cataract extraction status, left eye: Secondary | ICD-10-CM | POA: Diagnosis not present

## 2018-03-10 DIAGNOSIS — Z992 Dependence on renal dialysis: Secondary | ICD-10-CM | POA: Insufficient documentation

## 2018-03-10 DIAGNOSIS — T82858A Stenosis of vascular prosthetic devices, implants and grafts, initial encounter: Secondary | ICD-10-CM | POA: Diagnosis present

## 2018-03-10 DIAGNOSIS — K219 Gastro-esophageal reflux disease without esophagitis: Secondary | ICD-10-CM | POA: Diagnosis not present

## 2018-03-10 DIAGNOSIS — Y832 Surgical operation with anastomosis, bypass or graft as the cause of abnormal reaction of the patient, or of later complication, without mention of misadventure at the time of the procedure: Secondary | ICD-10-CM | POA: Diagnosis not present

## 2018-03-10 DIAGNOSIS — E1140 Type 2 diabetes mellitus with diabetic neuropathy, unspecified: Secondary | ICD-10-CM | POA: Diagnosis not present

## 2018-03-10 DIAGNOSIS — E11649 Type 2 diabetes mellitus with hypoglycemia without coma: Secondary | ICD-10-CM | POA: Insufficient documentation

## 2018-03-10 DIAGNOSIS — E119 Type 2 diabetes mellitus without complications: Secondary | ICD-10-CM | POA: Diagnosis not present

## 2018-03-10 DIAGNOSIS — M1990 Unspecified osteoarthritis, unspecified site: Secondary | ICD-10-CM | POA: Insufficient documentation

## 2018-03-10 DIAGNOSIS — T82868A Thrombosis of vascular prosthetic devices, implants and grafts, initial encounter: Secondary | ICD-10-CM

## 2018-03-10 HISTORY — PX: A/V FISTULAGRAM: CATH118298

## 2018-03-10 LAB — POTASSIUM (ARMC VASCULAR LAB ONLY): Potassium (ARMC vascular lab): 5 (ref 3.5–5.1)

## 2018-03-10 SURGERY — A/V FISTULAGRAM
Anesthesia: Moderate Sedation

## 2018-03-10 MED ORDER — HYDROMORPHONE HCL 1 MG/ML IJ SOLN: 1.0000 mg | Freq: Once | INTRAMUSCULAR | Status: DC | PRN

## 2018-03-10 MED ORDER — MIDAZOLAM HCL 5 MG/5ML IJ SOLN
INTRAMUSCULAR | Status: AC
  Filled 2018-03-10: qty 5

## 2018-03-10 MED ORDER — HYDRALAZINE HCL 20 MG/ML IJ SOLN
INTRAMUSCULAR | Status: AC
  Filled 2018-03-10: qty 1

## 2018-03-10 MED ORDER — LABETALOL HCL 5 MG/ML IV SOLN
INTRAVENOUS | Status: AC
  Filled 2018-03-10: qty 4

## 2018-03-10 MED ORDER — CEFAZOLIN SODIUM-DEXTROSE 1-4 GM/50ML-% IV SOLN
INTRAVENOUS | Status: AC
  Administered 2018-03-10: 1 g via INTRAVENOUS
  Filled 2018-03-10: qty 50

## 2018-03-10 MED ORDER — HEPARIN SODIUM (PORCINE) 1000 UNIT/ML IJ SOLN
INTRAMUSCULAR | Status: AC
  Filled 2018-03-10: qty 1

## 2018-03-10 MED ORDER — ONDANSETRON HCL 4 MG/2ML IJ SOLN: 4.0000 mg | Freq: Four times a day (QID) | INTRAMUSCULAR | Status: DC | PRN

## 2018-03-10 MED ORDER — METHYLPREDNISOLONE SODIUM SUCC 125 MG IJ SOLR: 125.0000 mg | INTRAMUSCULAR | Status: DC | PRN

## 2018-03-10 MED ORDER — HEPARIN SODIUM (PORCINE) 1000 UNIT/ML IJ SOLN
INTRAMUSCULAR | Status: DC | PRN
  Administered 2018-03-10: 3000 [IU] via INTRAVENOUS

## 2018-03-10 MED ORDER — HEPARIN (PORCINE) IN NACL 1000-0.9 UT/500ML-% IV SOLN
INTRAVENOUS | Status: AC
  Filled 2018-03-10: qty 500

## 2018-03-10 MED ORDER — SODIUM CHLORIDE 0.9 % IV SOLN
INTRAVENOUS | Status: DC
  Administered 2018-03-10: 08:00:00 via INTRAVENOUS

## 2018-03-10 MED ORDER — FENTANYL CITRATE (PF) 100 MCG/2ML IJ SOLN
INTRAMUSCULAR | Status: AC
  Filled 2018-03-10: qty 2

## 2018-03-10 MED ORDER — FENTANYL CITRATE (PF) 100 MCG/2ML IJ SOLN
INTRAMUSCULAR | Status: DC | PRN
  Administered 2018-03-10 (×2): 50 ug via INTRAVENOUS

## 2018-03-10 MED ORDER — IOPAMIDOL (ISOVUE-300) INJECTION 61%
INTRAVENOUS | Status: DC | PRN
  Administered 2018-03-10: 30 mL via INTRA_ARTERIAL

## 2018-03-10 MED ORDER — FAMOTIDINE 20 MG PO TABS: 40.0000 mg | ORAL_TABLET | ORAL | Status: DC | PRN

## 2018-03-10 MED ORDER — HYDRALAZINE HCL 20 MG/ML IJ SOLN
INTRAMUSCULAR | Status: DC | PRN
  Administered 2018-03-10: 10 mg via INTRAVENOUS

## 2018-03-10 MED ORDER — MIDAZOLAM HCL 2 MG/2ML IJ SOLN
INTRAMUSCULAR | Status: DC | PRN
  Administered 2018-03-10: 2 mg via INTRAVENOUS
  Administered 2018-03-10: 1 mg via INTRAVENOUS

## 2018-03-10 SURGICAL SUPPLY — 11 items
BALLN LUTONIX 7X80X130 (BALLOONS) ×3
BALLOON LUTONIX 7X80X130 (BALLOONS) ×1 IMPLANT
CANNULA 5F STIFF (CANNULA) ×3 IMPLANT
CATH BEACON 5 .035 40 KMP TP (CATHETERS) ×1 IMPLANT
CATH BEACON 5 .038 40 KMP TP (CATHETERS) ×2
DEVICE PRESTO INFLATION (MISCELLANEOUS) ×3 IMPLANT
DRAPE BRACHIAL (DRAPES) ×3 IMPLANT
PACK ANGIOGRAPHY (CUSTOM PROCEDURE TRAY) ×3 IMPLANT
SHEATH BRITE TIP 6FRX5.5 (SHEATH) ×3 IMPLANT
SUT MNCRL AB 4-0 PS2 18 (SUTURE) ×3 IMPLANT
WIRE MAGIC TOR.035 180C (WIRE) ×3 IMPLANT

## 2018-03-10 NOTE — H&P (Signed)
[facility] Admission History & Physical  MRN : [id number]  Kendra [name] is a 67 y.o. ([birth year]) female who presents with chief complaint of No chief complaint on file. [name]  History of Present Illness: I am asked to evaluate the patient by the dialysis center. The patient was sent here because they were unable to achieve adequate dialysis this morning. Furthermore the Center states there is very poor thrill and bruit. The patient states there there have been increasing problems with the access, such as "pulling clots" during dialysis and prolonged bleeding after decannulation. The patient estimates these problems have been going on for several weeks. The patient is unaware of any other change.  Patient denies pain or tenderness overlying the access.  There is no pain with dialysis.  The patient denies hand pain or finger pain consistent with steal syndrome.   There have been past interventions or declots of this access.  The patient is not chronically hypotensive on dialysis.  Current Facility-Administered Medications  Medication Dose Route Frequency Provider Last Rate Last Dose  . 0.9 %  sodium chloride infusion   Intravenous Continuous Stegmayer, Kimberly A, PA-C      . ceFAZolin (ANCEF) 1-4 GM/50ML-% IVPB           . ceFAZolin (ANCEF) IVPB 1 g/50 mL premix  1 g Intravenous Once Stegmayer, Kimberly A, PA-C      . famotidine (PEPCID) tablet 40 mg  40 mg Oral PRN Stegmayer, Janalyn Harder, PA-C      . HYDROmorphone (DILAUDID) injection 1 mg  1 mg Intravenous Once PRN Stegmayer, Kimberly A, PA-C      . methylPREDNISolone sodium succinate (SOLU-MEDROL) 125 mg/2 mL injection 125 mg  125 mg Intravenous PRN Stegmayer, Kimberly A, PA-C      . ondansetron (ZOFRAN) injection 4 mg  4 mg Intravenous Q6H PRN Stegmayer, Janalyn Harder, PA-C        Past Medical History:  Diagnosis Date  . Anemia of chronic renal failure    DIALYSIS  . Diabetic neuropathy ([place])   . Diabetic  retinopathy ([place])   . GERD (gastroesophageal reflux disease)   . Hyperparathyroidism, secondary renal ([place])   . Hypertension   . Hypothyroidism   . Insulin dependent diabetes mellitus ([place])   . Osteoarthritis    bilateral shoulders  . Primary hypertension   . Shingles outbreak 04/2017    Past Surgical History:  Procedure Laterality Date  . A/V FISTULAGRAM Left 08/19/2017   Procedure: A/V Fistulagram;  Surgeon: Algernon Huxley, MD;  Location: [facility];  Service: Cardiovascular;  Laterality: Left;  . ABDOMINAL HYSTERECTOMY    . BASCILIC VEIN TRANSPOSITION Left 03/04/2016   Procedure: BASCILIC VEIN TRANSPOSITION ( STAGE 1 );  Surgeon: Algernon Huxley, MD;  Location: [facility];  Service: Vascular;  Laterality: Left;  . CATARACT EXTRACTION W/PHACO Right 10/07/2017   Procedure: CATARACT EXTRACTION PHACO AND INTRAOCULAR LENS PLACEMENT (IOC)-RIGHT DIABETIC;  Surgeon: Eulogio Bear, MD;  Location: [facility];  Service: Ophthalmology;  Laterality: Right;  [place] 00:30.8 AP% 8.9 CDE 2.75 FLUID PACK LOT # [id number]  . CATARACT EXTRACTION W/PHACO Left 11/04/2017   Procedure: CATARACT EXTRACTION PHACO AND INTRAOCULAR LENS PLACEMENT (IOC);  Surgeon: Eulogio Bear, MD;  Location: [facility];  Service: Ophthalmology;  Laterality: Left;  [place] 00:30.0 AP% 8.0 CDE 2.41 Fluid Pack lot # [id number]  . CHOLECYSTECTOMY    . PERIPHERAL VASCULAR CATHETERIZATION N/A 01/22/2016   Procedure: Dialysis/Perma Catheter Insertion;  Surgeon: Katha Cabal, MD;  Location: [facility];  Service: Cardiovascular;  Laterality: N/A;  . PERIPHERAL VASCULAR CATHETERIZATION Left 05/14/2016   Procedure: A/V Shuntogram/Fistulagram;  Surgeon: Algernon Huxley, MD;  Location: [facility];  Service: Cardiovascular;  Laterality: Left;  . PERIPHERAL VASCULAR CATHETERIZATION N/A 05/14/2016   Procedure: A/V Shunt Intervention;  Surgeon: Algernon Huxley, MD;  Location: [facility];  Service: Cardiovascular;   Laterality: N/A;  . PERIPHERAL VASCULAR CATHETERIZATION N/A 06/25/2016   Procedure: Dialysis/Perma Catheter Removal;  Surgeon: Algernon Huxley, MD;  Location: [facility];  Service: Cardiovascular;  Laterality: N/A;  . PERIPHERAL VASCULAR CATHETERIZATION Left 08/03/2016   Procedure: A/V Shuntogram/Fistulagram;  Surgeon: Algernon Huxley, MD;  Location: [facility];  Service: Cardiovascular;  Laterality: Left;  . PERIPHERAL VASCULAR CATHETERIZATION N/A 08/03/2016   Procedure: A/V Shunt Intervention;  Surgeon: Algernon Huxley, MD;  Location: [facility];  Service: Cardiovascular;  Laterality: N/A;  . PERIPHERAL VASCULAR CATHETERIZATION Left 08/07/2016   Procedure: A/V Shuntogram/Fistulagram;  Surgeon: Katha Cabal, MD;  Location: [facility];  Service: Cardiovascular;  Laterality: Left;  . PERIPHERAL VASCULAR CATHETERIZATION Left 09/22/2016   Procedure: A/V Fistulagram;  Surgeon: Katha Cabal, MD;  Location: [facility];  Service: Cardiovascular;  Laterality: Left;  . THYROIDECTOMY      Social History Social History   Tobacco Use  . Smoking status: Never Smoker  . Smokeless tobacco: Never Used  Substance Use Topics  . Alcohol use: No    Alcohol/week: 0.0 oz  . Drug use: No    Family History Family History  Problem Relation Age of Onset  . Renal Disease Brother        with renal transplant  . Diabetes Mellitus II Father   . Diabetes Mellitus II Mother   . Diabetes Mellitus II Brother   . Breast cancer Neg Hx     No family history of bleeding or clotting disorders, autoimmune disease or porphyria  No Known Allergies   REVIEW OF SYSTEMS (Negative unless checked)  Constitutional: [] Weight loss  [] Fever  [] Chills Cardiac: [] Chest pain   [] Chest pressure   [] Palpitations   [] Shortness of breath when laying flat   [] Shortness of breath at rest   [x] Shortness of breath with exertion. Vascular:  [] Pain in legs with walking   [] Pain in legs at  rest   [] Pain in legs when laying flat   [] Claudication   [] Pain in feet when walking  [] Pain in feet at rest  [] Pain in feet when laying flat   [] History of DVT   [] Phlebitis   [] Swelling in legs   [] Varicose veins   [] Non-healing ulcers Pulmonary:   [] Uses home oxygen   [] Productive cough   [] Hemoptysis   [] Wheeze  [] COPD   [] Asthma Neurologic:  [] Dizziness  [] Blackouts   [] Seizures   [] History of stroke   [] History of TIA  [] Aphasia   [] Temporary blindness   [] Dysphagia   [] Weakness or numbness in arms   [] Weakness or numbness in legs Musculoskeletal:  [x] Arthritis   [] Joint swelling   [] Joint pain   [] Low back pain Hematologic:  [] Easy bruising  [] Easy bleeding   [] Hypercoagulable state   [x] Anemic  [] Hepatitis Gastrointestinal:  [] Blood in stool   [] Vomiting blood  [x] Gastroesophageal reflux/heartburn   [] Difficulty swallowing. Genitourinary:  [x] Chronic kidney disease   [] Difficult urination  [] Frequent urination  [] Burning with urination   [] Blood in urine  Skin:  [] Rashes   [] Ulcers   [] Wounds Psychological:  [] History of anxiety   []  History of major depression.  Physical Examination  Vitals:   03/10/18 0842  BP: (!) 147/80  Pulse: 75  Resp: 18  Temp: 98 F (36.7 C)  TempSrc: Oral  SpO2: 99%  Weight: 218 lb (98.9 kg)  Height: 5\' 6"  (1.676 m)   Body mass index is 35.19 kg/m. Gen: WD/WN, NAD Head: [place]/AT, No temporalis wasting.  Ear/Nose/Throat: Hearing grossly intact, nares w/o erythema or drainage, oropharynx w/o Erythema/Exudate,  Eyes: Conjunctiva clear, sclera non-icteric Neck: Trachea midline.  No JVD.  Pulmonary:  Good air movement, respirations not labored, no use of accessory muscles.  Cardiac: RRR, normal S1, S2. Vascular: pulsatile left arm AVF with thrill present Vessel Right Left  Radial Palpable Palpable               Musculoskeletal: M/S 5/5 throughout.  Extremities without ischemic changes.  No deformity or atrophy.  Neurologic: Sensation grossly intact  in extremities.  Symmetrical.  Speech is fluent. Motor exam as listed above. Psychiatric: Judgment intact, Mood & affect appropriate for pt's clinical situation. Dermatologic: No rashes or ulcers noted.  No cellulitis or open wounds.    CBC Lab Results  Component Value Date   WBC 5.0 05/21/2017   HGB 13.3 11/04/2017   HCT 39.0 11/04/2017   MCV 99.2 05/21/2017   PLT 354 05/21/2017    BMET    Component Value Date/Time   NA 146 (H) 11/04/2017 0858   NA 141 09/09/2014 1357   K 4.7 11/04/2017 0858   K 4.3 09/09/2014 1357   CL 95 (L) 05/21/2017 0606   CL 108 (H) 09/09/2014 1357   CO2 27 05/21/2017 0606   CO2 25 09/09/2014 1357   GLUCOSE 120 (H) 11/04/2017 0858   GLUCOSE 173 (H) 09/09/2014 1357   BUN 27 (H) 05/21/2017 0606   BUN 41 (H) 09/09/2014 1357   CREATININE 8.73 (H) 05/21/2017 0606   CREATININE 1.47 (H) 09/09/2014 1357   CALCIUM 9.3 05/21/2017 0606   CALCIUM 8.4 (L) 09/09/2014 1357   GFRNONAA 4 (L) 05/21/2017 0606   GFRNONAA 38 (L) 09/09/2014 1357   GFRAA 5 (L) 05/21/2017 0606   GFRAA 46 (L) 09/09/2014 1357   CrCl cannot be calculated (Patient's most recent lab result is older than the maximum 21 days allowed.).  COAG Lab Results  Component Value Date   INR 0.90 02/27/2016    Radiology No results found.  Assessment/Plan 1.  Complication dialysis device with dysfunction of AV access:  Patient's left arm dialysis access is malfunctioning. The patient will undergo angiography and correction of any problems using interventional techniques with the hope of restoring function to the access.  The risks and benefits were described to the patient.  All questions were answered.  The patient agrees to proceed with angiography and intervention. Potassium will be drawn to ensure that it is an appropriate level prior to performing intervention. 2.  End-stage renal disease requiring hemodialysis:  Patient will continue dialysis therapy without further interruption if a  successful intervention is not achieved then a tunneled catheter will be placed. Dialysis has already been arranged. 3.  Hypertension:  Patient will continue medical management; nephrology is following no changes in oral medications. 4. Diabetes mellitus:  Glucose will be monitored and oral medications been held this morning once the patient has undergone the patient's procedure po intake will be reinitiated and again Accu-Cheks will be used to assess  the blood glucose level and treat as needed. The patient will be restarted on the patient's usual hypoglycemic regime    Leotis Pain, MD  03/10/2018 10:05 AM

## 2018-03-10 NOTE — Op Note (Signed)
[facility]    OPERATIVE NOTE   PROCEDURE: 1.   Left brachiocephalic arteriovenous fistula cannulation under ultrasound guidance 2.   Left arm fistulagram including central venogram 3.   Percutaneous transluminal angioplasty of mid upper arm cephalic vein with 7 mm diameter by 8 cm length Lutonix drug-coated angioplasty balloon  PRE-OPERATIVE DIAGNOSIS: 1. ESRD 2. Poorly functional left brachiocephalic AVF  POST-OPERATIVE DIAGNOSIS: same as above   SURGEON: Leotis Pain, MD  ANESTHESIA: local with MCS  ESTIMATED BLOOD LOSS: 5 cc  FINDING(S): 1. Several centimeters of a near occlusive greater than 90% stenosis at what appeared to be the venous access site of the left brachiocephalic AV fistula.  The remainder of the fistula appeared to be widely patent as to the central venous circulation.  SPECIMEN(S):  None  CONTRAST: 20 cc  FLUORO TIME: 1.3 minutes  MODERATE CONSCIOUS SEDATION TIME: Approximately 20 minutes with 3 mg of Versed and 100 Mcg of Fentanyl   INDICATIONS: Kendra [name] is a 67 y.o. female who presents with malfunctioning left brachiocephalic arteriovenous fistula.  The patient is scheduled for left arm fistulagram.  The patient is aware the risks include but are not limited to: bleeding, infection, thrombosis of the cannulated access, and possible anaphylactic reaction to the contrast.  The patient is aware of the risks of the procedure and elects to proceed forward.  DESCRIPTION: After full informed written consent was obtained, the patient was brought back to the angiography suite and placed supine upon the angiography table.  The patient was connected to monitoring equipment. Moderate conscious sedation was administered with a face to face encounter with the patient throughout the procedure with my supervision of the RN administering medicines and monitoring the patient's vital signs and mental status throughout from the start of the  procedure until the patient was taken to the recovery room. The left arm was prepped and draped in the standard fashion for a percutaneous access intervention.  Under ultrasound guidance, the left brachiocephalic arteriovenous fistula was cannulated in an antegrade fashion near the anastomosis with a micropuncture needle under direct ultrasound guidance and a permanent image was performed.  The microwire was advanced into the fistula and the needle was exchanged for the a microsheath.  I then upsized to a 6 Fr Sheath and imaging was performed.  Hand injections were completed to image the access including the central venous system. This demonstrated Several centimeters of a near occlusive stenosis at what appeared to be the venous access site of the left brachiocephalic AV fistula.  The remainder of the fistula appeared to be widely patent as to the central venous circulation.  Based on the images, this patient will need intervention to this area. I then gave the patient 3000 units of intravenous heparin.  I then crossed the stenosis with a Magic Tourqe wire.  Based on the imaging, a 7 mm x 8 cm  Lutonix drug coated angioplasty balloon was selected.  The balloon was centered around the right upper arm cephalic vein stenosis and inflated to 14 ATM for 1 minute(s).  On completion imaging, a 10-15 % residual stenosis was present.     Based on the completion imaging, no further intervention is necessary.  The wire and balloon were removed from the sheath.  A 4-0 Monocryl purse-string suture was sewn around the sheath.  The sheath was removed while tying down the suture.  A sterile bandage was applied to the puncture site.  COMPLICATIONS: None  CONDITION:  Stable   Leotis Pain  03/10/2018 11:48 AM   This note was created with Dragon Medical transcription system. Any errors in dictation are purely unintentional.

## 2018-04-06 ENCOUNTER — Other Ambulatory Visit: Payer: Self-pay | Admitting: Podiatry

## 2018-04-07 ENCOUNTER — Other Ambulatory Visit: Payer: Self-pay

## 2018-04-07 ENCOUNTER — Encounter
Admission: RE | Admit: 2018-04-07 | Discharge: 2018-04-07 | Disposition: A | Discharge status: Home / Self Care | Payer: Medicare Other | Source: Ambulatory Visit | Attending: Podiatry | Admitting: Podiatry

## 2018-04-07 DIAGNOSIS — Z01818 Encounter for other preprocedural examination: Secondary | ICD-10-CM | POA: Insufficient documentation

## 2018-04-07 DIAGNOSIS — E119 Type 2 diabetes mellitus without complications: Secondary | ICD-10-CM | POA: Diagnosis not present

## 2018-04-07 HISTORY — DX: Polyneuropathy, unspecified: G62.9

## 2018-04-07 HISTORY — DX: Peripheral vascular disease, unspecified: I73.9

## 2018-04-07 LAB — CBC
HCT: 31.6 % — ABNORMAL LOW (ref 35.0–47.0)
Hemoglobin: 10.6 g/dL — ABNORMAL LOW (ref 12.0–16.0)
MCH: 32.9 pg (ref 26.0–34.0)
MCHC: 33.4 g/dL (ref 32.0–36.0)
MCV: 98.4 fL (ref 80.0–100.0)
Platelets: 209 10*3/uL (ref 150–440)
RBC: 3.21 MIL/uL — ABNORMAL LOW (ref 3.80–5.20)
RDW: 13.9 % (ref 11.5–14.5)
WBC: 6.4 10*3/uL (ref 3.6–11.0)

## 2018-04-07 NOTE — Patient Instructions (Signed)
Your procedure is scheduled on: 04/14/18 Report to Day Surgery. [facility]OND FLOOR To find out your arrival time please call [phone] between 1PM - 3PM on 04/13/18  Remember: Instructions that are not followed completely may result in serious medical risk, up to and including death, or upon the discretion of your surgeon and anesthesiologist your surgery may need to be rescheduled.     _X__ 1. Do not eat food after midnight the night before your procedure.                 No gum chewing or hard candies. You may drink clear liquids up to 2 hours                 before you are scheduled to arrive for your surgery- DO not drink clear                 liquids within 2 hours of the start of your surgery.                 Clear Liquids include:  water, apple juice without pulp, clear carbohydrate                 drink such as Clearfast of Gartorade, Black Coffee or Tea (Do not add                 anything to coffee or tea).  __X__2.  On the morning of surgery brush your teeth with toothpaste and water, you                 may rinse your mouth with mouthwash if you wish.  Do not swallow any              toothpaste of mouthwash.     _X__ 3.  No Alcohol for 24 hours before or after surgery.   _X__ 4.  Do Not Smoke or use e-cigarettes For 24 Hours Prior to Your Surgery.                 Do not use any chewable tobacco products for at least 6 hours prior to                 surgery.  ____  5.  Bring all medications with you on the day of surgery if instructed.   __X__  6.  Notify your doctor if there is any change in your medical condition      (cold, fever, infections).     Do not wear jewelry, make-up, hairpins, clips or nail polish. Do not wear lotions, powders, or perfumes. You may wear deodorant. Do not shave 48 hours prior to surgery. Men may shave face and neck. Do not bring valuables to the hospital.    [facility] is not responsible for any belongings or  valuables.  Contacts, dentures or bridgework may not be worn into surgery. Leave your suitcase in the car. After surgery it may be brought to your room. For patients admitted to the hospital, discharge time is determined by your treatment team.   Patients discharged the day of surgery will not be allowed to drive home.   Please read over the following fact sheets that you were given:   Surgical Site Infection Prevention   __X__ Take these medicines the morning of surgery with A SIP OF WATER:    1.LEVOTHYROXINE  2.   3.   4.  5.  6.  ____ Fleet Enema (  as directed)   __X__ Use CHG Soap as directed  ____ Use inhalers on the day of surgery  ____ Stop metformin 2 days prior to surgery    _X___ Take 1/2 of usual insulin dose the night before surgery. No insulin the morning          of surgery.   _X ___ Stop Coumadin/Plavix/aspirin on  STOP PLAVIX AS INSTRUCTED BY DR TROXLER OFFICE. ____ Stop Anti-inflammatories on    ____ Stop supplements until after surgery.    ____ Bring C-Pap to the hospital.

## 2018-04-07 NOTE — Anesthesia Pain Management Evaluation Note (Signed)
NO BP,IV,STICKS LEFT ARM.

## 2018-04-07 NOTE — Pre-Procedure Instructions (Addendum)
SPOKE WITH DR Georgette Shell AND ASKED THAT THEY NOTIFY PATIENT WHEN TO STOP PLAVIX PREOP  NO BP,IV, STICKS LEFT ARM

## 2018-04-11 ENCOUNTER — Other Ambulatory Visit
Admission: RE | Admit: 2018-04-11 | Discharge: 2018-04-11 | Disposition: A | Discharge status: Home / Self Care | Payer: Medicare Other | Source: Ambulatory Visit | Attending: Internal Medicine | Admitting: Internal Medicine

## 2018-04-11 DIAGNOSIS — N186 End stage renal disease: Secondary | ICD-10-CM | POA: Insufficient documentation

## 2018-04-11 LAB — POTASSIUM: Potassium: 5.6 mmol/L — ABNORMAL HIGH (ref 3.5–5.1)

## 2018-04-12 ENCOUNTER — Other Ambulatory Visit (INDEPENDENT_AMBULATORY_CARE_PROVIDER_SITE_OTHER): Payer: Self-pay | Admitting: Vascular Surgery

## 2018-04-12 ENCOUNTER — Encounter (INDEPENDENT_AMBULATORY_CARE_PROVIDER_SITE_OTHER): Payer: Self-pay

## 2018-04-13 ENCOUNTER — Ambulatory Visit: Admission: RE | Admit: 2018-04-13 | Payer: Medicare Other | Source: Ambulatory Visit | Admitting: Vascular Surgery

## 2018-04-13 SURGERY — PERIPHERAL VASCULAR THROMBECTOMY
Anesthesia: Moderate Sedation | Laterality: Left

## 2018-04-14 ENCOUNTER — Ambulatory Visit: Payer: Medicare Other | Admitting: Anesthesiology

## 2018-04-14 ENCOUNTER — Ambulatory Visit
Admission: RE | Admit: 2018-04-14 | Discharge: 2018-04-14 | Disposition: A | Discharge status: Home / Self Care | Payer: Medicare Other | Source: Ambulatory Visit | Attending: Podiatry | Admitting: Podiatry

## 2018-04-14 ENCOUNTER — Encounter
Admission: RE | Disposition: A | Discharge status: Home / Self Care | Payer: Self-pay | Source: Ambulatory Visit | Attending: Podiatry

## 2018-04-14 ENCOUNTER — Other Ambulatory Visit: Payer: Self-pay

## 2018-04-14 DIAGNOSIS — N186 End stage renal disease: Secondary | ICD-10-CM | POA: Diagnosis not present

## 2018-04-14 DIAGNOSIS — M19012 Primary osteoarthritis, left shoulder: Secondary | ICD-10-CM | POA: Insufficient documentation

## 2018-04-14 DIAGNOSIS — Z9841 Cataract extraction status, right eye: Secondary | ICD-10-CM | POA: Insufficient documentation

## 2018-04-14 DIAGNOSIS — I120 Hypertensive chronic kidney disease with stage 5 chronic kidney disease or end stage renal disease: Secondary | ICD-10-CM | POA: Diagnosis not present

## 2018-04-14 DIAGNOSIS — Z833 Family history of diabetes mellitus: Secondary | ICD-10-CM | POA: Insufficient documentation

## 2018-04-14 DIAGNOSIS — E1140 Type 2 diabetes mellitus with diabetic neuropathy, unspecified: Secondary | ICD-10-CM | POA: Diagnosis not present

## 2018-04-14 DIAGNOSIS — Z794 Long term (current) use of insulin: Secondary | ICD-10-CM | POA: Insufficient documentation

## 2018-04-14 DIAGNOSIS — Z8619 Personal history of other infectious and parasitic diseases: Secondary | ICD-10-CM | POA: Diagnosis not present

## 2018-04-14 DIAGNOSIS — M89371 Hypertrophy of bone, right ankle and foot: Secondary | ICD-10-CM | POA: Insufficient documentation

## 2018-04-14 DIAGNOSIS — S93334A Other dislocation of right foot, initial encounter: Secondary | ICD-10-CM | POA: Diagnosis not present

## 2018-04-14 DIAGNOSIS — N289 Disorder of kidney and ureter, unspecified: Secondary | ICD-10-CM | POA: Diagnosis not present

## 2018-04-14 DIAGNOSIS — S93121A Dislocation of metatarsophalangeal joint of right great toe, initial encounter: Secondary | ICD-10-CM | POA: Diagnosis not present

## 2018-04-14 DIAGNOSIS — S93124A Dislocation of metatarsophalangeal joint of right lesser toe(s), initial encounter: Secondary | ICD-10-CM | POA: Diagnosis not present

## 2018-04-14 DIAGNOSIS — E785 Hyperlipidemia, unspecified: Secondary | ICD-10-CM | POA: Diagnosis not present

## 2018-04-14 DIAGNOSIS — E890 Postprocedural hypothyroidism: Secondary | ICD-10-CM | POA: Insufficient documentation

## 2018-04-14 DIAGNOSIS — Z9842 Cataract extraction status, left eye: Secondary | ICD-10-CM | POA: Insufficient documentation

## 2018-04-14 DIAGNOSIS — M2031 Hallux varus (acquired), right foot: Secondary | ICD-10-CM | POA: Diagnosis not present

## 2018-04-14 DIAGNOSIS — Z992 Dependence on renal dialysis: Secondary | ICD-10-CM | POA: Diagnosis not present

## 2018-04-14 DIAGNOSIS — I10 Essential (primary) hypertension: Secondary | ICD-10-CM | POA: Insufficient documentation

## 2018-04-14 DIAGNOSIS — M1990 Unspecified osteoarthritis, unspecified site: Secondary | ICD-10-CM | POA: Insufficient documentation

## 2018-04-14 DIAGNOSIS — X58XXXA Exposure to other specified factors, initial encounter: Secondary | ICD-10-CM | POA: Insufficient documentation

## 2018-04-14 DIAGNOSIS — E042 Nontoxic multinodular goiter: Secondary | ICD-10-CM | POA: Diagnosis not present

## 2018-04-14 DIAGNOSIS — M2030 Hallux varus (acquired), unspecified foot: Secondary | ICD-10-CM | POA: Diagnosis present

## 2018-04-14 DIAGNOSIS — E1122 Type 2 diabetes mellitus with diabetic chronic kidney disease: Secondary | ICD-10-CM | POA: Insufficient documentation

## 2018-04-14 DIAGNOSIS — D631 Anemia in chronic kidney disease: Secondary | ICD-10-CM | POA: Insufficient documentation

## 2018-04-14 DIAGNOSIS — I739 Peripheral vascular disease, unspecified: Secondary | ICD-10-CM | POA: Insufficient documentation

## 2018-04-14 DIAGNOSIS — M19011 Primary osteoarthritis, right shoulder: Secondary | ICD-10-CM | POA: Insufficient documentation

## 2018-04-14 DIAGNOSIS — K219 Gastro-esophageal reflux disease without esophagitis: Secondary | ICD-10-CM | POA: Insufficient documentation

## 2018-04-14 DIAGNOSIS — Z841 Family history of disorders of kidney and ureter: Secondary | ICD-10-CM | POA: Insufficient documentation

## 2018-04-14 HISTORY — PX: ARTHRODESIS METATARSALPHALANGEAL JOINT (MTPJ): SHX6566

## 2018-04-14 HISTORY — PX: METATARSAL HEAD EXCISION: SHX5027

## 2018-04-14 LAB — POCT I-STAT 4, (NA,K, GLUC, HGB,HCT)
Glucose, Bld: 96 mg/dL (ref 70–99)
HCT: 29 % — ABNORMAL LOW (ref 36.0–46.0)
Hemoglobin: 9.9 g/dL — ABNORMAL LOW (ref 12.0–15.0)
Potassium: 4.4 mmol/L (ref 3.5–5.1)
SODIUM: 138 mmol/L (ref 135–145)

## 2018-04-14 LAB — GLUCOSE, CAPILLARY
GLUCOSE-CAPILLARY: 92 mg/dL (ref 70–99)
GLUCOSE-CAPILLARY: 97 mg/dL (ref 70–99)

## 2018-04-14 SURGERY — EXCISION, METATARSAL BONE, HEAD
Anesthesia: General | Laterality: Right

## 2018-04-14 MED ORDER — CEFAZOLIN SODIUM-DEXTROSE 2-4 GM/100ML-% IV SOLN: 2.0000 g | INTRAVENOUS | Status: DC

## 2018-04-14 MED ORDER — FAMOTIDINE 20 MG PO TABS
20.0000 mg | ORAL_TABLET | Freq: Once | ORAL | Status: AC
  Administered 2018-04-14: 20 mg via ORAL

## 2018-04-14 MED ORDER — CEFAZOLIN SODIUM-DEXTROSE 2-4 GM/100ML-% IV SOLN
INTRAVENOUS | Status: AC
  Filled 2018-04-14: qty 100

## 2018-04-14 MED ORDER — PROPOFOL 500 MG/50ML IV EMUL
INTRAVENOUS | Status: AC
  Filled 2018-04-14: qty 50

## 2018-04-14 MED ORDER — PROPOFOL 10 MG/ML IV BOLUS
INTRAVENOUS | Status: DC | PRN
  Administered 2018-04-14: 40 mg via INTRAVENOUS
  Administered 2018-04-14: 50 mg via INTRAVENOUS
  Administered 2018-04-14: 20 mg via INTRAVENOUS
  Administered 2018-04-14: 60 mg via INTRAVENOUS

## 2018-04-14 MED ORDER — CEFAZOLIN SODIUM-DEXTROSE 1-4 GM/50ML-% IV SOLN
1.0000 g | Freq: Once | INTRAVENOUS | Status: AC
  Administered 2018-04-14: 2 g via INTRAVENOUS

## 2018-04-14 MED ORDER — SODIUM CHLORIDE FLUSH 0.9 % IV SOLN
INTRAVENOUS | Status: AC
  Filled 2018-04-14: qty 10

## 2018-04-14 MED ORDER — EPHEDRINE SULFATE 50 MG/ML IJ SOLN
INTRAMUSCULAR | Status: AC
  Filled 2018-04-14: qty 1

## 2018-04-14 MED ORDER — ACETAMINOPHEN 325 MG PO TABS
ORAL_TABLET | ORAL | Status: AC
  Filled 2018-04-14: qty 1

## 2018-04-14 MED ORDER — POVIDONE-IODINE 7.5 % EX SOLN
Freq: Once | CUTANEOUS | Status: DC
  Filled 2018-04-14: qty 118

## 2018-04-14 MED ORDER — SODIUM CHLORIDE 0.9 % IV SOLN: INTRAVENOUS | Status: DC

## 2018-04-14 MED ORDER — ACETAMINOPHEN 160 MG/5ML PO SOLN: 325.0000 mg | ORAL | Status: DC | PRN

## 2018-04-14 MED ORDER — MIDAZOLAM HCL 2 MG/2ML IJ SOLN
INTRAMUSCULAR | Status: AC
  Filled 2018-04-14: qty 2

## 2018-04-14 MED ORDER — SODIUM CHLORIDE 0.9 % IV SOLN
INTRAVENOUS | Status: DC
  Administered 2018-04-14: 10:00:00 via INTRAVENOUS

## 2018-04-14 MED ORDER — PHENYLEPHRINE HCL 10 MG/ML IJ SOLN
INTRAMUSCULAR | Status: DC | PRN
  Administered 2018-04-14 (×2): 100 ug via INTRAVENOUS

## 2018-04-14 MED ORDER — METHYLPREDNISOLONE SODIUM SUCC 125 MG IJ SOLR: 125.0000 mg | INTRAMUSCULAR | Status: DC | PRN

## 2018-04-14 MED ORDER — ACETAMINOPHEN 325 MG PO TABS
325.0000 mg | ORAL_TABLET | ORAL | Status: DC | PRN
  Administered 2018-04-14: 325 mg via ORAL

## 2018-04-14 MED ORDER — HYDROCODONE-ACETAMINOPHEN 7.5-325 MG PO TABS: 1.0000 | ORAL_TABLET | Freq: Four times a day (QID) | ORAL | 0 refills | Status: DC | PRN

## 2018-04-14 MED ORDER — MIDAZOLAM HCL 2 MG/2ML IJ SOLN
INTRAMUSCULAR | Status: DC | PRN
  Administered 2018-04-14: 1 mg via INTRAVENOUS

## 2018-04-14 MED ORDER — PROPOFOL 500 MG/50ML IV EMUL
INTRAVENOUS | Status: DC | PRN
  Administered 2018-04-14: 30 ug/kg/min via INTRAVENOUS

## 2018-04-14 MED ORDER — FAMOTIDINE 20 MG PO TABS
ORAL_TABLET | ORAL | Status: AC
  Filled 2018-04-14: qty 1

## 2018-04-14 MED ORDER — ONDANSETRON HCL 4 MG/2ML IJ SOLN
INTRAMUSCULAR | Status: AC
  Administered 2018-04-14: 4 mg via INTRAVENOUS
  Filled 2018-04-14: qty 2

## 2018-04-14 MED ORDER — CHLORHEXIDINE GLUCONATE 4 % EX LIQD: 60.0000 mL | Freq: Once | CUTANEOUS | Status: DC

## 2018-04-14 MED ORDER — HYDROCODONE-ACETAMINOPHEN 7.5-325 MG PO TABS: 1.0000 | ORAL_TABLET | Freq: Once | ORAL | Status: DC | PRN

## 2018-04-14 MED ORDER — FENTANYL CITRATE (PF) 100 MCG/2ML IJ SOLN
INTRAMUSCULAR | Status: AC
  Filled 2018-04-14: qty 2

## 2018-04-14 MED ORDER — FAMOTIDINE 20 MG PO TABS: 40.0000 mg | ORAL_TABLET | ORAL | Status: DC | PRN

## 2018-04-14 MED ORDER — EPHEDRINE SULFATE 50 MG/ML IJ SOLN
INTRAMUSCULAR | Status: DC | PRN
  Administered 2018-04-14: 5 mg via INTRAVENOUS
  Administered 2018-04-14 (×2): 10 mg via INTRAVENOUS

## 2018-04-14 MED ORDER — LIDOCAINE HCL (PF) 1 % IJ SOLN
INTRAMUSCULAR | Status: AC
Start: 2018-04-14 — End: ?
  Filled 2018-04-14: qty 30

## 2018-04-14 MED ORDER — BUPIVACAINE HCL (PF) 0.5 % IJ SOLN
INTRAMUSCULAR | Status: AC
  Filled 2018-04-14: qty 30

## 2018-04-14 MED ORDER — FENTANYL CITRATE (PF) 100 MCG/2ML IJ SOLN
25.0000 ug | INTRAMUSCULAR | Status: DC | PRN
  Administered 2018-04-14 (×4): 25 ug via INTRAVENOUS

## 2018-04-14 MED ORDER — LIDOCAINE HCL (PF) 2 % IJ SOLN
INTRAMUSCULAR | Status: AC
  Filled 2018-04-14: qty 10

## 2018-04-14 MED ORDER — FENTANYL CITRATE (PF) 100 MCG/2ML IJ SOLN
INTRAMUSCULAR | Status: AC
  Administered 2018-04-14: 25 ug via INTRAVENOUS
  Filled 2018-04-14: qty 2

## 2018-04-14 MED ORDER — BUPIVACAINE HCL (PF) 0.5 % IJ SOLN
INTRAMUSCULAR | Status: DC | PRN
  Administered 2018-04-14: 10 mL

## 2018-04-14 MED ORDER — FENTANYL CITRATE (PF) 100 MCG/2ML IJ SOLN
INTRAMUSCULAR | Status: DC | PRN
  Administered 2018-04-14 (×2): 25 ug via INTRAVENOUS
  Administered 2018-04-14: 50 ug via INTRAVENOUS

## 2018-04-14 MED ORDER — ONDANSETRON HCL 4 MG/2ML IJ SOLN
INTRAMUSCULAR | Status: DC | PRN
  Administered 2018-04-14: 4 mg via INTRAVENOUS

## 2018-04-14 MED ORDER — ONDANSETRON HCL 4 MG/2ML IJ SOLN
4.0000 mg | Freq: Once | INTRAMUSCULAR | Status: AC | PRN
  Administered 2018-04-14: 4 mg via INTRAVENOUS

## 2018-04-14 SURGICAL SUPPLY — 66 items
BAG COUNTER SPONGE EZ (MISCELLANEOUS) IMPLANT
BANDAGE ELASTIC 4 LF NS (GAUZE/BANDAGES/DRESSINGS) ×3 IMPLANT
BIT DRILL CANNULTD 2.6 X 130MM (DRILL) ×1 IMPLANT
BLADE MED AGGRESSIVE (BLADE) ×3 IMPLANT
BLADE OSC/SAGITTAL MD 5.5X18 (BLADE) IMPLANT
BLADE SURG 15 STRL LF DISP TIS (BLADE) ×3 IMPLANT
BLADE SURG 15 STRL SS (BLADE) ×6
BLADE SURG MINI STRL (BLADE) ×3 IMPLANT
BNDG CONFORM 3 STRL LF (GAUZE/BANDAGES/DRESSINGS) ×3 IMPLANT
BNDG ESMARK 4X12 TAN STRL LF (GAUZE/BANDAGES/DRESSINGS) ×3 IMPLANT
BNDG GAUZE 4.5X4.1 6PLY STRL (MISCELLANEOUS) ×3 IMPLANT
CANISTER SUCT 1200ML W/VALVE (MISCELLANEOUS) ×3 IMPLANT
CLOSURE WOUND 1/4X4 (GAUZE/BANDAGES/DRESSINGS) ×1
COUNTER SPONGE BAG EZ (MISCELLANEOUS)
COUNTERSICK 4.0 HEADED (MISCELLANEOUS) ×3
COVER PIN YLW 0.028-062 (MISCELLANEOUS) ×3 IMPLANT
CUFF TOURN 18 STER (MISCELLANEOUS) ×3 IMPLANT
CUFF TOURN DUAL PL 12 NO SLV (MISCELLANEOUS) IMPLANT
DRAPE FLUOR MINI C-ARM 54X84 (DRAPES) ×3 IMPLANT
DRILL CANNULATED 2.6 X 130MM (DRILL) ×3
DRSG TEGADERM 4X4.75 (GAUZE/BANDAGES/DRESSINGS) IMPLANT
DURAPREP 26ML APPLICATOR (WOUND CARE) ×3 IMPLANT
ELECT REM PT RETURN 9FT ADLT (ELECTROSURGICAL) ×3
ELECTRODE REM PT RTRN 9FT ADLT (ELECTROSURGICAL) ×1 IMPLANT
GAUZE PETRO XEROFOAM 1X8 (MISCELLANEOUS) ×3 IMPLANT
GAUZE SPONGE 4X4 12PLY STRL (GAUZE/BANDAGES/DRESSINGS) ×3 IMPLANT
GAUZE STRETCH 2X75IN STRL (MISCELLANEOUS) ×3 IMPLANT
GLOVE BIO SURGEON STRL SZ8 (GLOVE) ×3 IMPLANT
GLOVE INDICATOR 8.0 STRL GRN (GLOVE) ×3 IMPLANT
GOWN STRL REUS W/ TWL LRG LVL3 (GOWN DISPOSABLE) ×4 IMPLANT
GOWN STRL REUS W/TWL LRG LVL3 (GOWN DISPOSABLE) ×8
K-WIRE SNGL END 1.2X150 (MISCELLANEOUS) ×3
KIT TURNOVER KIT A (KITS) ×3 IMPLANT
KWIRE SNGL END 1.2X150 (MISCELLANEOUS) ×1 IMPLANT
LABEL OR SOLS (LABEL) ×3 IMPLANT
NDL SAFETY ECLIPSE 18X1.5 (NEEDLE) ×1 IMPLANT
NEEDLE FILTER BLUNT 18X 1/2SAF (NEEDLE) ×2
NEEDLE FILTER BLUNT 18X1 1/2 (NEEDLE) ×1 IMPLANT
NEEDLE HYPO 18GX1.5 SHARP (NEEDLE) ×2
NEEDLE HYPO 25X1 1.5 SAFETY (NEEDLE) ×6 IMPLANT
NEEDLE MAYO 6 CRC TAPER PT (NEEDLE) ×3 IMPLANT
NS IRRIG 500ML POUR BTL (IV SOLUTION) ×3 IMPLANT
PACK EXTREMITY ARMC (MISCELLANEOUS) ×3 IMPLANT
PAD PREP 24X41 OB/GYN DISP (PERSONAL CARE ITEMS) ×3 IMPLANT
PADDING CAST BLEND 4X4 NS (MISCELLANEOUS) ×6 IMPLANT
PENCIL ELECTRO HAND CTR (MISCELLANEOUS) IMPLANT
RASP SM TEAR CROSS CUT (RASP) ×3 IMPLANT
SCREW CANN 4.0X46ST (Screw) ×3 IMPLANT
SCREW COUNTERSINK 4.0 HEADED (MISCELLANEOUS) ×1 IMPLANT
SPLINT CAST 1 STEP 4X30 (MISCELLANEOUS) ×3 IMPLANT
SPLINT FAST PLASTER 5X30 (CAST SUPPLIES) ×2
SPLINT PLASTER CAST FAST 5X30 (CAST SUPPLIES) ×1 IMPLANT
STOCKINETTE M/LG 89821 (MISCELLANEOUS) ×3 IMPLANT
STOCKINETTE STRL 6IN 960660 (GAUZE/BANDAGES/DRESSINGS) IMPLANT
STRIP CLOSURE SKIN 1/4X4 (GAUZE/BANDAGES/DRESSINGS) ×2 IMPLANT
SUT ETHILON 5-0 FS-2 18 BLK (SUTURE) ×3 IMPLANT
SUT FIBERWIRE #2 38 T-5 BLUE (SUTURE) ×3
SUT VIC AB 3-0 SH 27 (SUTURE) ×6
SUT VIC AB 3-0 SH 27X BRD (SUTURE) ×3 IMPLANT
SUT VIC AB 4-0 FS2 27 (SUTURE) ×3 IMPLANT
SUT VICRYL AB 3-0 FS1 BRD 27IN (SUTURE) ×3 IMPLANT
SUTURE FIBERWR #2 38 T-5 BLUE (SUTURE) ×1 IMPLANT
SYR 10ML LL (SYRINGE) ×3 IMPLANT
SYR 20CC LL (SYRINGE) IMPLANT
WIRE Z .045 C-WIRE SPADE TIP (WIRE) ×3 IMPLANT
WIRE Z .062 C-WIRE SPADE TIP (WIRE) IMPLANT

## 2018-04-14 NOTE — Anesthesia Post-op Follow-up Note (Signed)
Anesthesia QCDR form completed.        

## 2018-04-14 NOTE — H&P (Signed)
H and P has been reviewed and no changes are noted.   Heart hs a regular rate and rhythm.  Lungs Clear   No recent chest pains or upper respiratory  problems

## 2018-04-14 NOTE — Anesthesia Procedure Notes (Signed)
Procedure Name: LMA Insertion Date/Time: 04/14/2018 12:07 PM Performed by: Nelda Marseille, CRNA Pre-anesthesia Checklist: Patient identified, Patient being monitored, Timeout performed, Emergency Drugs available and Suction available Patient Re-evaluated:Patient Re-evaluated prior to induction Oxygen Delivery Method: Circle system utilized Preoxygenation: Pre-oxygenation with 100% oxygen Induction Type: IV induction Ventilation: Mask ventilation without difficulty LMA: LMA inserted LMA Size: 3.5 Tube type: Oral Number of attempts: 1 Placement Confirmation: positive ETCO2 and breath sounds checked- equal and bilateral Tube secured with: Tape Dental Injury: Teeth and Oropharynx as per pre-operative assessment

## 2018-04-14 NOTE — Op Note (Signed)
Operative note   Surgeon: Dr. Albertine Patricia, DPM.    Assistant: None    Preop diagnosis: 1.  Hallux malleus right foot 2.  Plantar displaced second and first metatarsals right foot 3.  Dislocated first and second metatarsophalangeal joints right foot 4.  Prominent fifth metatarsal head right foot    Postop diagnosis: Same    Procedure:   1.  Resection of first second and fifth metatarsal heads.   2.  Arthrodesis IP joint right hallux       EBL: Less than 10 cc    Anesthesia:general letter by the anesthesia team with local anesthetic delivered by me    Hemostasis: Ankle tourniquet 2 and 50 mils marked pressure for 100 minutes    Specimen: Degenerative bone and cartilage from first second and fifth metatarsal heads    Complications: None    Operative indications: Patient had an injury to her foot several years ago resulted in fractures and shortening of the third and fourth metatarsal heads second first and fifth metatarsal has became prominent patient also likely suffered subluxation and dislocation of the first and second MTP joints that timeframe.  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  It is stable at present but will break down again otherwise.    Procedure:  Patient was brought into the OR and placed on the operating table in thesupine position. After anesthesia was obtained theright lower extremity was prepped and draped in usual sterile fashion.  Operative Report: This time to direct to the first MTP joint where a 5 cm dorsal linear skin incision was made extending all the way up to the base of the nail of the great toe.  Incision was deepened sharp but dissection bleeders clamped and bovied as required.  Tissue was identified incised longitudinally.  This was reflected medial laterally from the metatarsal the proximal phalanx was dislocated up to the dorsal aspect of the metatarsal head this had to be resected and freed and then the  metatarsal head was cut approximately 1.5 cm proximal to the cartilage.  The head metatarsal was then removed the degenerative portion of bone and cartilage was then sent for evaluation.  This time attention directed to the IP joint of the hallux which was identified with extensor tendon dorsally this was incised transversely reflected proximally distally.  Cartilage was removed from both sides of the joint and the and a K wire was then run distally through the distal phalanx and then the 2 bones were approximated and the K wire was run the length of the shaft of the proximal phalanx.  There is checked FluoroScan good position and correction were noted.  At this point a 4 oh screws placed across this K wire.  Good fixation was noted.  Alignment and position of the joint was noted at this time all FluoroScan.  After copious irrigation the extensor tendon over the IP joint was sutured with 3-0 Vicryl simple interrupted sutures.  Proximally the damage capsule tissue was repaired with 3-0 Vicryl and interposed in the dead space to the area.  Remaining Tissue was then closed with 3-0 Vicryl in continuous stitch.  The area was copiously irrigated with saline throughout the process.  The superficial fascial layers and closed with 4-0 Vicryl continuous stitch and skin was closed with 4-0 Vicryl subcuticular stitch.  At this time attention directed to the second toe where a 3 cm linear incision was made extending over the distal metatarsal and up  on the proximal phalanx base.  This toe was also seen to be dislocated and sitting up on the metatarsal head with proximal phalanx base.  Incision made through the capsule and down to bone the Tissue was freed and the metatarsal head was resected.  This point 2 holes were drilled in the base of the proximal phalanx and 2-0 FiberWire was used to run through those 2 holes and the wire was sutured into the plantar plate area in order to stabilize that toe more.  This was then  sutured and closed down the dorsal aspect of the proximal phalanx.  There is an copiously irrigated and capsule tissue was then closed with 4-0 Vicryl and 3-0 Vicryl in a continuous stitch.  Deep superficial fascial layers were closed with 4-0 Vicryl in continuous stitch skin was skin was closed with 4-0 Vicryl in continuous stitch.  This time to direct to the fifth toe of the right foot where a 3 similarly incision made over the fifth metatarsal head area.  Was deepened sharp blunt dissection was clamped above described.  This was identified incised longitudinally.  The head metatarsal was identified and the metatarsal head was then resected with power equipment and freed from its soft tissue attachment removed in toto.  This allowed for much better parabola across the metatarsal heads and should reduce quite a bit of the pressure and friction plantarly she has.  Fifth metatarsal area was copiously irrigated and capsule tissue was closed with 4-0 Vicryl in continuous stitch as were the deep superficial fascial layers.  Skin was closed with 4-0 Vicryl in subcuticular fashion.  This time sterile compressive dressing was placed across wound consisting of Steri-Strips Xeroform gauze 4 x 4's Kling Kerlix  K wire that had been used to put cannulated screw and was also run all the way across the metatarsophalangeal joint on the first toe.  This is bent and incorporated into the bandage.  Posterior splint was placed on the right foot leg in the OR.     Patient tolerated the procedure and anesthesia well.  Was transported from the OR to the PACU with all vital signs stable and vascular status intact. To be discharged per routine protocol.  Will follow up in approximately 1 week in the outpatient clinic.

## 2018-04-14 NOTE — OR Nursing (Signed)
Dr. Elvina Mattes in to see pt postop.   Advises weight bearing as tolerated with postop shoe in place when ambulating, advises he will put discharge order in Epic.

## 2018-04-14 NOTE — OR Nursing (Signed)
Incentive Spirometry with pt from pacu, instructions given postop.

## 2018-04-14 NOTE — Anesthesia Preprocedure Evaluation (Signed)
Anesthesia Evaluation  Patient identified by MRN, date of birth, ID band Patient awake    Reviewed: Allergy & Precautions, H&P , NPO status , reviewed documented beta blocker date and time   Airway Mallampati: II  TM Distance: >3 FB Neck ROM: limited    Dental  (+) Missing, Chipped   Pulmonary    Pulmonary exam normal        Cardiovascular hypertension, + Peripheral Vascular Disease  Normal cardiovascular exam     Neuro/Psych    GI/Hepatic GERD  Controlled,  Endo/Other  diabetesHypothyroidism   Renal/GU Renal disease     Musculoskeletal  (+) Arthritis ,   Abdominal   Peds  Hematology  (+) anemia ,   Anesthesia Other Findings Past Medical History: No date: Anemia of chronic renal failure     Comment:  DIALYSIS m/w/f No date: Diabetic neuropathy (HCC) No date: Diabetic retinopathy (HCC) No date: GERD (gastroesophageal reflux disease) No date: Hyperparathyroidism, secondary renal (HCC) No date: Hypertension No date: Hypothyroidism No date: Insulin dependent diabetes mellitus (HCC) No date: Neuropathy     Comment:  feet and fingers No date: Osteoarthritis     Comment:  bilateral shoulders No date: Peripheral vascular disease ([place]) No date: Primary hypertension 04/2017: Shingles outbreak  Past Surgical History: 08/19/2017: A/V FISTULAGRAM; Left     Comment:  Procedure: A/V Fistulagram;  Surgeon: Algernon Huxley, MD;               Location: [place] [facility];  Service: Cardiovascular;              Laterality: Left; 03/10/2018: A/V FISTULAGRAM; N/A     Comment:  Procedure: A/V FISTULAGRAM;  Surgeon: Algernon Huxley, MD;               Location: [place] [facility];  Service: Cardiovascular;              Laterality: N/A; No date: ABDOMINAL HYSTERECTOMY 8/58/8502: [place]; Left     Comment:  Procedure: BASCILIC VEIN TRANSPOSITION ( STAGE 1 );                Surgeon: Algernon Huxley, MD;   Location: [facility];  Service:               Vascular;  Laterality: Left; 10/07/2017: CATARACT EXTRACTION W/PHACO; Right     Comment:  Procedure: CATARACT EXTRACTION PHACO AND INTRAOCULAR               LENS PLACEMENT (IOC)-RIGHT DIABETIC;  Surgeon: Eulogio Bear, MD;  Location: [facility];  Service:               Ophthalmology;  Laterality: Right;  [place] 00:30.8 AP%               8.9 CDE 2.75 FLUID PACK LOT # [id number] 11/04/2017: CATARACT EXTRACTION W/PHACO; Left     Comment:  Procedure: CATARACT EXTRACTION PHACO AND INTRAOCULAR               LENS PLACEMENT (IOC);  Surgeon: Eulogio Bear, MD;                Location: [facility];  Service: Ophthalmology;  Laterality:              Left;  [place] 00:30.0 AP% 8.0 CDE 2.41 Fluid Pack lot #  [id number] No date: CHOLECYSTECTOMY 01/22/2016: PERIPHERAL VASCULAR CATHETERIZATION; N/A     Comment:  Procedure: Dialysis/Perma Catheter Insertion;  Surgeon:               Katha Cabal, MD;  Location: [place] [facility];                Service: Cardiovascular;  Laterality: N/A; 05/14/2016: PERIPHERAL VASCULAR CATHETERIZATION; Left     Comment:  Procedure: A/V Shuntogram/Fistulagram;  Surgeon: Algernon Huxley, MD;  Location: [place] [facility];  Service:               Cardiovascular;  Laterality: Left; 05/14/2016: PERIPHERAL VASCULAR CATHETERIZATION; N/A     Comment:  Procedure: A/V Shunt Intervention;  Surgeon: Algernon Huxley, MD;  Location: [place] [facility];  Service:               Cardiovascular;  Laterality: N/A; 06/25/2016: PERIPHERAL VASCULAR CATHETERIZATION; N/A     Comment:  Procedure: Dialysis/Perma Catheter Removal;  Surgeon:               Algernon Huxley, MD;  Location: [place] [facility];                Service: Cardiovascular;  Laterality: N/A; 08/03/2016: PERIPHERAL VASCULAR CATHETERIZATION; Left     Comment:  Procedure: A/V Shuntogram/Fistulagram;  Surgeon: Algernon Huxley, MD;   Location: [place] [facility];  Service:               Cardiovascular;  Laterality: Left; 08/03/2016: PERIPHERAL VASCULAR CATHETERIZATION; N/A     Comment:  Procedure: A/V Shunt Intervention;  Surgeon: Algernon Huxley, MD;  Location: [place] [facility];  Service:               Cardiovascular;  Laterality: N/A; 08/07/2016: PERIPHERAL VASCULAR CATHETERIZATION; Left     Comment:  Procedure: A/V Shuntogram/Fistulagram;  Surgeon: Katha Cabal, MD;  Location: [place] [facility];  Service:              Cardiovascular;  Laterality: Left; 09/22/2016: PERIPHERAL VASCULAR CATHETERIZATION; Left     Comment:  Procedure: A/V Fistulagram;  Surgeon: Katha Cabal,              MD;  Location: [place] [facility];  Service:               Cardiovascular;  Laterality: Left; No date: THYROIDECTOMY  BMI    Body Mass Index:  35.67 kg/m      Reproductive/Obstetrics                             Anesthesia Physical Anesthesia Plan  ASA: III  Anesthesia Plan: General   Post-op Pain Management:    Induction:   PONV Risk Score and Plan: 3 and Treatment may vary due to age or medical condition, TIVA and Ondansetron  Airway Management Planned:   Additional Equipment:   Intra-op Plan:   Post-operative Plan:   Informed Consent: I have reviewed the patients History and  Physical, chart, labs and discussed the procedure including the risks, benefits and alternatives for the proposed anesthesia with the patient or authorized representative who has indicated his/her understanding and acceptance.   Dental Advisory Given  Plan Discussed with: CRNA  Anesthesia Plan Comments:         Anesthesia Quick Evaluation

## 2018-04-14 NOTE — Discharge Instructions (Addendum)
[place] DR. [place].  Please wait until tomorrow before you start taking the Clopedigrel   1. Take your medication as prescribed.  Pain medication should be taken only as needed.  2. Keep the dressing clean, dry and intact.  3. Keep your foot elevated above the heart level for the first 48 hours.  4. Walking to the bathroom and brief periods of walking are acceptable, unless we have instructed you to be non-weight bearing.  5. Always wear your post-op shoe when walking.  Always use your crutches if you are to be non-weight bearing.  6. Do not take a shower. Baths are permissible as long as the foot is kept out of the water.   7. Every hour you are awake:  - Bend your knee 15 times. - Flex foot 15 times - Massage calf 15 times  8. Call [facility] [phone]) if any of the following problems occur: - You develop a temperature or fever. - The bandage becomes saturated with blood. - Medication does not stop your pain. - Injury of the foot occurs. - Any symptoms of infection including redness, odor, or red streaks running from wound     AMBULATORY SURGERY  DISCHARGE INSTRUCTIONS   1) The drugs that you were given will stay in your system until tomorrow so for the next 24 hours you should not:  A) Drive an automobile B) Make any legal decisions C) Drink any alcoholic beverage   2) You may resume regular meals tomorrow.  Today it is better to start with liquids and gradually work up to solid foods.  You may eat anything you prefer, but it is better to start with liquids, then soup and crackers, and gradually work up to solid foods.   3) Please notify your doctor immediately if you have any unusual bleeding, trouble breathing, redness and pain at the surgery site, drainage, fever, or pain not relieved by medication.    4) Additional  Instructions:        Please contact your physician with any problems or Same Day Surgery at [phone], Monday through Friday 6 am to 4 pm, or [place] at [facility] number at [phone].

## 2018-04-14 NOTE — Transfer of Care (Signed)
Immediate Anesthesia Transfer of Care Note  Patient: Kendra [name]  Procedure(s) Performed: METATARSAL HEAD EXCISION-1ST, 2ND & 5TH MET HEADS (Right ) ARTHRODESIS; HALLUX/IP JOINT (Right )  Patient Location: PACU  Anesthesia Type:General  Level of Consciousness: drowsy and patient cooperative  Airway & Oxygen Therapy: Patient Spontanous Breathing and Patient connected to nasal cannula oxygen  Post-op Assessment: Report given to RN and Post -op Vital signs reviewed and stable  Post vital signs: Reviewed and stable  Last Vitals:  Vitals Value Taken Time  BP 146/75 04/14/2018  1:53 PM  Temp 36.7 C 04/14/2018  1:53 PM  Pulse 81 04/14/2018  1:54 PM  Resp 20 04/14/2018  1:54 PM  SpO2 97 % 04/14/2018  1:54 PM  Vitals shown include unvalidated device data.  Last Pain:  Vitals:   04/14/18 0929  TempSrc: Temporal         Complications: No apparent anesthesia complications 

## 2018-04-15 ENCOUNTER — Encounter: Payer: Self-pay | Admitting: Podiatry

## 2018-04-19 LAB — SURGICAL PATHOLOGY

## 2018-04-25 NOTE — Anesthesia Postprocedure Evaluation (Addendum)
Anesthesia Post Note  Patient: Kendra [name]  Procedure(s) Performed: METATARSAL HEAD EXCISION-1ST, 2ND & 5TH MET HEADS (Right ) ARTHRODESIS; HALLUX/IP JOINT (Right )  Patient location during evaluation: PACU Anesthesia Type: General Level of consciousness: awake and alert Pain management: pain level controlled Vital Signs Assessment: post-procedure vital signs reviewed and stable Respiratory status: spontaneous breathing, nonlabored ventilation and respiratory function stable Cardiovascular status: blood pressure returned to baseline and stable Postop Assessment: no apparent nausea or vomiting Anesthetic complications: no     Last Vitals:  Vitals:   04/14/18 1506 04/14/18 1532  BP: (!) 174/79 (!) 167/74  Pulse: 73 73  Resp: 16 16  Temp: (!) 36.3 C 36.4 C  SpO2: 94% 96%    Last Pain:  Vitals:   04/15/18 0831  TempSrc:   PainSc: 7                  Jahmeek Shirk Harvie Heck

## 2018-05-26 ENCOUNTER — Other Ambulatory Visit (INDEPENDENT_AMBULATORY_CARE_PROVIDER_SITE_OTHER): Payer: Self-pay | Admitting: Vascular Surgery

## 2018-05-26 ENCOUNTER — Ambulatory Visit (INDEPENDENT_AMBULATORY_CARE_PROVIDER_SITE_OTHER): Payer: Medicare Other

## 2018-05-26 ENCOUNTER — Ambulatory Visit (INDEPENDENT_AMBULATORY_CARE_PROVIDER_SITE_OTHER): Payer: Medicare Other | Admitting: Vascular Surgery

## 2018-05-26 ENCOUNTER — Encounter (INDEPENDENT_AMBULATORY_CARE_PROVIDER_SITE_OTHER): Payer: Self-pay | Admitting: Vascular Surgery

## 2018-05-26 VITALS — BP 184/88 | HR 73 | Resp 73 | Ht 66.0 in | Wt 220.0 lb

## 2018-05-26 DIAGNOSIS — Z992 Dependence on renal dialysis: Secondary | ICD-10-CM | POA: Diagnosis not present

## 2018-05-26 DIAGNOSIS — Z9862 Peripheral vascular angioplasty status: Secondary | ICD-10-CM

## 2018-05-26 DIAGNOSIS — T829XXD Unspecified complication of cardiac and vascular prosthetic device, implant and graft, subsequent encounter: Secondary | ICD-10-CM | POA: Diagnosis not present

## 2018-05-26 DIAGNOSIS — N186 End stage renal disease: Secondary | ICD-10-CM | POA: Diagnosis not present

## 2018-05-26 DIAGNOSIS — D649 Anemia, unspecified: Secondary | ICD-10-CM

## 2018-05-26 NOTE — Progress Notes (Signed)
Subjective:    Patient ID: Kendra [name], female    DOB: 19-Jun-1951, 67 y.o.   MRN: [id number] Chief Complaint  Patient presents with  . Follow-up    HDA [place] follow up   Patient presents for her first post procedure follow-up.  The patient is status post a left brachiocephalic fistulogram with intervention on Mar 10, 2018.  The patient reports an improvement in the functioning of her dialysis access since her recent intervention. The patient denies any issues with hemodialysis such as cannulation problems, increased bleeding, decrease in doppler flow or recirculation. The patient also denies any fistula skin breakdown, pain, edema, pallor or ulceration of the arm / hand.  The patient underwent a left upper extremity HDA which was notable for a total flow volume of 1516.  There is a significant area of stenosis noted in the [facility] fossa (711) otherwise the brachiocephalic AV fistula is patent..  She denies any fever, nausea vomiting.  Review of Systems  Constitutional: Negative.   HENT: Negative.   Eyes: Negative.   Respiratory: Negative.   Cardiovascular: Negative.   Gastrointestinal: Negative.   Endocrine: Negative.   Genitourinary: Negative.   Musculoskeletal: Negative.   Skin: Negative.   Allergic/Immunologic: Negative.   Neurological: Negative.   Hematological: Negative.   Psychiatric/Behavioral: Negative.       Objective:   Physical Exam  Constitutional: She is oriented to person, place, and time. She appears well-developed and well-nourished. No distress.  HENT:  Head: Normocephalic and atraumatic.  Right Ear: External ear normal.  Left Ear: External ear normal.  Eyes: Pupils are equal, round, and reactive to light. Conjunctivae and EOM are normal.  Neck: Normal range of motion.  Cardiovascular: Normal rate, regular rhythm, normal heart sounds and intact distal pulses.  Pulses:      Radial pulses are 2+ on the right side, and 2+ on the left side.  Upper extremity dialysis  access: Intact.  Good bruit and thrill noted.  Pulmonary/Chest: Effort normal and breath sounds normal.  Musculoskeletal: Normal range of motion. She exhibits no edema.  Neurological: She is alert and oriented to person, place, and time.  Skin: Skin is warm and dry. She is not diaphoretic.  Psychiatric: She has a normal mood and affect. Her behavior is normal. Judgment and thought content normal.  Vitals reviewed.  BP (!) 184/88 (BP Location: Right Arm, Patient Position: Sitting)   Pulse 73   Resp (!) 73   Ht _0  (1.676 m)   Wt 220 lb (99.8 kg)   BMI 35.51 kg/m   Past Medical History:  Diagnosis Date  . Anemia of chronic renal failure    DIALYSIS m/w/f  . Diabetic neuropathy ([place])   . Diabetic retinopathy ([place])   . GERD (gastroesophageal reflux disease)   . Hyperparathyroidism, secondary renal ([place])   . Hypertension   . Hypothyroidism   . Insulin dependent diabetes mellitus ([place])   . Neuropathy    feet and fingers  . Osteoarthritis    bilateral shoulders  . Peripheral vascular disease ([place])   . Primary hypertension   . Shingles outbreak 04/2017   Social History   Socioeconomic History  . Marital status: Single    Spouse name: Not on file  . Number of children: Not on file  . Years of education: Not on file  . Highest education level: Not on file  Occupational History  . Not on file  Social Needs  . Financial resource strain: Not on file  .  Food insecurity:    Worry: Not on file    Inability: Not on file  . Transportation needs:    Medical: Not on file    Non-medical: Not on file  Tobacco Use  . Smoking status: Never Smoker  . Smokeless tobacco: Never Used  Substance and Sexual Activity  . Alcohol use: No    Alcohol/week: 0.0 standard drinks  . Drug use: No  . Sexual activity: Not Currently  Lifestyle  . Physical activity:    Days per week: Not on file    Minutes per session: Not on file  . Stress: Not on file  Relationships  . Social connections:      Talks on phone: Not on file    Gets together: Not on file    Attends religious service: Not on file    Active member of club or organization: Not on file    Attends meetings of clubs or organizations: Not on file    Relationship status: Not on file  . Intimate partner violence:    Fear of current or ex partner: Not on file    Emotionally abused: Not on file    Physically abused: Not on file    Forced sexual activity: Not on file  Other Topics Concern  . Not on file  Social History Narrative   Single lives alone.  She is employed. She has no impairements   Past Surgical History:  Procedure Laterality Date  . A/V FISTULAGRAM Left 08/19/2017   Procedure: A/V Fistulagram;  Surgeon: Algernon Huxley, MD;  Location: [place] [facility];  Service: Cardiovascular;  Laterality: Left;  . A/V FISTULAGRAM N/A 03/10/2018   Procedure: A/V FISTULAGRAM;  Surgeon: Algernon Huxley, MD;  Location: [place] [facility];  Service: Cardiovascular;  Laterality: N/A;  . ABDOMINAL HYSTERECTOMY    . ARTHRODESIS METATARSALPHALANGEAL JOINT (MTPJ) Right 04/14/2018   Procedure: ARTHRODESIS; HALLUX/IP JOINT;  Surgeon: Albertine Patricia, DPM;  Location: [facility];  Service: Podiatry;  Laterality: Right;  . BASCILIC VEIN TRANSPOSITION Left 03/04/2016   Procedure: BASCILIC VEIN TRANSPOSITION ( STAGE 1 );  Surgeon: Algernon Huxley, MD;  Location: [facility];  Service: Vascular;  Laterality: Left;  . CATARACT EXTRACTION W/PHACO Right 10/07/2017   Procedure: CATARACT EXTRACTION PHACO AND INTRAOCULAR LENS PLACEMENT (IOC)-RIGHT DIABETIC;  Surgeon: Eulogio Bear, MD;  Location: [facility];  Service: Ophthalmology;  Laterality: Right;  [place] 00:30.8 AP% 8.9 CDE 2.75 FLUID PACK LOT # [id number]  . CATARACT EXTRACTION W/PHACO Left 11/04/2017   Procedure: CATARACT EXTRACTION PHACO AND INTRAOCULAR LENS PLACEMENT (IOC);  Surgeon: Eulogio Bear, MD;  Location: [facility];  Service: Ophthalmology;  Laterality: Left;  [place] 00:30.0 AP% 8.0 CDE  2.41 Fluid Pack lot # [id number]  . CHOLECYSTECTOMY    . METATARSAL HEAD EXCISION Right 04/14/2018   Procedure: METATARSAL HEAD EXCISION-1ST, 2ND & 5TH MET HEADS;  Surgeon: Albertine Patricia, DPM;  Location: [facility];  Service: Podiatry;  Laterality: Right;  . PERIPHERAL VASCULAR CATHETERIZATION N/A 01/22/2016   Procedure: Dialysis/Perma Catheter Insertion;  Surgeon: Katha Cabal, MD;  Location: [place] [facility];  Service: Cardiovascular;  Laterality: N/A;  . PERIPHERAL VASCULAR CATHETERIZATION Left 05/14/2016   Procedure: A/V Shuntogram/Fistulagram;  Surgeon: Algernon Huxley, MD;  Location: [place] [facility];  Service: Cardiovascular;  Laterality: Left;  . PERIPHERAL VASCULAR CATHETERIZATION N/A 05/14/2016   Procedure: A/V Shunt Intervention;  Surgeon: Algernon Huxley, MD;  Location: [place] [facility];  Service: Cardiovascular;  Laterality: N/A;  .  PERIPHERAL VASCULAR CATHETERIZATION N/A 06/25/2016   Procedure: Dialysis/Perma Catheter Removal;  Surgeon: Algernon Huxley, MD;  Location: [place] [facility];  Service: Cardiovascular;  Laterality: N/A;  . PERIPHERAL VASCULAR CATHETERIZATION Left 08/03/2016   Procedure: A/V Shuntogram/Fistulagram;  Surgeon: Algernon Huxley, MD;  Location: [place] [facility];  Service: Cardiovascular;  Laterality: Left;  . PERIPHERAL VASCULAR CATHETERIZATION N/A 08/03/2016   Procedure: A/V Shunt Intervention;  Surgeon: Algernon Huxley, MD;  Location: [place] [facility];  Service: Cardiovascular;  Laterality: N/A;  . PERIPHERAL VASCULAR CATHETERIZATION Left 08/07/2016   Procedure: A/V Shuntogram/Fistulagram;  Surgeon: Katha Cabal, MD;  Location: [place] [facility];  Service: Cardiovascular;  Laterality: Left;  . PERIPHERAL VASCULAR CATHETERIZATION Left 09/22/2016   Procedure: A/V Fistulagram;  Surgeon: Katha Cabal, MD;  Location: [place] [facility];  Service: Cardiovascular;  Laterality: Left;  . THYROIDECTOMY     Family History  Problem Relation Age of Onset    . Renal Disease Brother        with renal transplant  . Diabetes Mellitus II Father   . Diabetes Mellitus II Mother   . Diabetes Mellitus II Brother   . Breast cancer Neg Hx    No Known Allergies     Assessment & Plan:  Patient presents for her first post procedure follow-up.  The patient is status post a left brachiocephalic fistulogram with intervention on Mar 10, 2018.  The patient reports an improvement in the functioning of her dialysis access since her recent intervention. The patient denies any issues with hemodialysis such as cannulation problems, increased bleeding, decrease in doppler flow or recirculation. The patient also denies any fistula skin breakdown, pain, edema, pallor or ulceration of the arm / hand.  The patient underwent a left upper extremity HDA which was notable for a total flow volume of 1516.  There is a significant area of stenosis noted in the [facility] fossa (711) otherwise the brachiocephalic AV fistula is patent..  She denies any fever, nausea vomiting.  1. ESRD on dialysis ([place]) - Stable Patient presents for her first post procedure follow-up. The patient presents today noting an improvement in the functioning of her left upper extremity dialysis access after her recent brachiocephalic fistulogram on Mar 10, 2018. Duplex with an area of significant stenosis in the [facility] fossa (711) Strongly recommended another fistulogram in an attempt to correct this area of narrowing.  I explained to the patient that if this area continues to narrow there is a strong possibility that her fistula may thrombosed and she would need a PermCath inserted. Patient is very hesitant to move forward with another fistulogram as she feels she just went through one. The patient would prefer to follow-up in 3 months and just watch this area. The patient was instructed to call the office in the interim if any issues with dialysis access / doppler flow, pain, edema, pallor, fistula skin breakdown or  ulceration of the arm / hand occur. The patient expressed their understanding.  - VAS US DUPLEX DIALYSIS ACCESS (AVF,AVG); Future  2. Complication from renal dialysis device, subsequent encounter - Stable As above  3. Anemia, unspecified type - Stable As above  Current Outpatient Medications on File Prior to Visit  Medication Sig Dispense Refill  . atorvastatin (LIPITOR) 40 MG tablet Take 40 mg by mouth at bedtime.    . B Complex-C-Folic Acid (RENA-VITE RX PO) Take by mouth.    . calcium acetate (PHOSLO) 667 MG capsule Take  1,334 mg by mouth 3 (three) times daily with meals.     . Calcium Acetate 668 (169 Ca) MG TABS Take by mouth.    . carvedilol (COREG) 6.25 MG tablet Take 12.5 mg by mouth at bedtime.   5  . clopidogrel (PLAVIX) 75 MG tablet Take 75 mg by mouth daily with breakfast.     . docusate sodium (COLACE) 50 MG capsule Take 200 mg by mouth 2 (two) times daily.     . Dulaglutide (TRULICITY) 8.10 YV/4.68YO SOPN Inject 0.75 mg into the skin once a week. Takes on saturdays    . fosinopril (MONOPRIL) 40 MG tablet     . furosemide (LASIX) 80 MG tablet Take 80 mg by mouth every Tuesday, Thursday, Saturday, and Sunday. In the morning    . HYDROcodone-acetaminophen (NORCO) 7.5-325 MG tablet Take 1 tablet by mouth every 6 (six) hours as needed for moderate pain. 30 tablet 0  . insulin detemir (LEVEMIR) 100 UNIT/ML injection Inject 0.16 mLs (16 Units total) into the skin at bedtime. 10 mL 11  . Insulin Glargine (LANTUS SOLOSTAR) 100 UNIT/ML Solostar Pen Inject 20 Units into the skin daily at 10 pm.    . levothyroxine (SYNTHROID, LEVOTHROID) 175 MCG tablet Take 175 mcg by mouth daily before breakfast. 1 hour before breakfast  3  . lidocaine-prilocaine (EMLA) cream Apply 1 application topically as needed (port access).    . multivitamin (RENA-VIT) TABS tablet Take 1 tablet by mouth daily with breakfast.     . polyethylene glycol (MIRALAX / GLYCOLAX) packet Take 17 g by mouth daily. (Patient  taking differently: Take 17 g by mouth 2 (two) times daily. ) 14 each 0  . enalapril (VASOTEC) 20 MG tablet Take 20 mg by mouth at bedtime.   5   No current facility-administered medications on file prior to visit.    There are no Patient Instructions on file for this visit. No follow-ups on file.  Khyan Oats A Lesa Vandall, PA-C

## 2018-08-18 ENCOUNTER — Ambulatory Visit (INDEPENDENT_AMBULATORY_CARE_PROVIDER_SITE_OTHER): Payer: Medicare Other | Admitting: Nurse Practitioner

## 2018-08-18 ENCOUNTER — Encounter (INDEPENDENT_AMBULATORY_CARE_PROVIDER_SITE_OTHER): Payer: Self-pay | Admitting: Nurse Practitioner

## 2018-08-18 ENCOUNTER — Ambulatory Visit (INDEPENDENT_AMBULATORY_CARE_PROVIDER_SITE_OTHER): Payer: Medicare Other

## 2018-08-18 VITALS — BP 144/75 | HR 76 | Resp 18 | Ht 66.0 in | Wt 220.0 lb

## 2018-08-18 DIAGNOSIS — E1122 Type 2 diabetes mellitus with diabetic chronic kidney disease: Secondary | ICD-10-CM | POA: Diagnosis not present

## 2018-08-18 DIAGNOSIS — I120 Hypertensive chronic kidney disease with stage 5 chronic kidney disease or end stage renal disease: Secondary | ICD-10-CM | POA: Diagnosis not present

## 2018-08-18 DIAGNOSIS — Z992 Dependence on renal dialysis: Secondary | ICD-10-CM

## 2018-08-18 DIAGNOSIS — N186 End stage renal disease: Secondary | ICD-10-CM | POA: Diagnosis not present

## 2018-08-18 DIAGNOSIS — I10 Essential (primary) hypertension: Secondary | ICD-10-CM

## 2018-08-22 ENCOUNTER — Encounter (INDEPENDENT_AMBULATORY_CARE_PROVIDER_SITE_OTHER): Payer: Self-pay | Admitting: Nurse Practitioner

## 2018-08-22 ENCOUNTER — Encounter (INDEPENDENT_AMBULATORY_CARE_PROVIDER_SITE_OTHER): Payer: Self-pay

## 2018-08-22 NOTE — Progress Notes (Signed)
Subjective:    Patient ID: Kendra [name], female    DOB: [birth year], 67 y.o.   MRN: [id number] Chief Complaint  Patient presents with  . Follow-up    3 month HDA    HPI  Kendra [name] is a 67 y.o. female The patient returns to the office for follow up regarding problem with the dialysis access. Currently the patient is maintained via a left brachiocephalic AV fistula.   The patient notes a significant increase in bleeding time after decannulation.  The patient has also been informed that there is increased recirculation.    The patient denies hand pain or other symptoms consistent with steal phenomena.  No significant arm swelling.  The patient denies redness or swelling at the access site. The patient denies fever or chills at home or while on dialysis.  The patient denies amaurosis fugax or recent TIA symptoms. There are no recent neurological changes noted. The patient denies claudication symptoms or rest pain symptoms. The patient denies history of DVT, PE or superficial thrombophlebitis. The patient denies recent episodes of angina or shortness of breath.   The patient underwent a HDA today which had a flow volume of 1026.  The velocities in the mid upper arm were 609 with a vessel diameter side of 0.34.  There is also a change in the flow of the antecubital fossa with increased velocities of 312.   Past Medical History:  Diagnosis Date  . Anemia of chronic renal failure    DIALYSIS m/w/f  . Diabetic neuropathy ([place])   . Diabetic retinopathy ([place])   . GERD (gastroesophageal reflux disease)   . Hyperparathyroidism, secondary renal ([place])   . Hypertension   . Hypothyroidism   . Insulin dependent diabetes mellitus ([place])   . Neuropathy    feet and fingers  . Osteoarthritis    bilateral shoulders  . Peripheral vascular disease ([place])   . Primary hypertension   . Shingles outbreak 04/2017    Past Surgical History:  Procedure Laterality Date  . A/V FISTULAGRAM Left  08/19/2017   Procedure: A/V Fistulagram;  Surgeon: Algernon Huxley, MD;  Location: [facility];  Service: Cardiovascular;  Laterality: Left;  . A/V FISTULAGRAM N/A 03/10/2018   Procedure: A/V FISTULAGRAM;  Surgeon: Algernon Huxley, MD;  Location: [facility];  Service: Cardiovascular;  Laterality: N/A;  . ABDOMINAL HYSTERECTOMY    . ARTHRODESIS METATARSALPHALANGEAL JOINT (MTPJ) Right 04/14/2018   Procedure: ARTHRODESIS; HALLUX/IP JOINT;  Surgeon: Albertine Patricia, DPM;  Location: [facility];  Service: Podiatry;  Laterality: Right;  . BASCILIC VEIN TRANSPOSITION Left 03/04/2016   Procedure: BASCILIC VEIN TRANSPOSITION ( STAGE 1 );  Surgeon: Algernon Huxley, MD;  Location: [facility];  Service: Vascular;  Laterality: Left;  . CATARACT EXTRACTION W/PHACO Right 10/07/2017   Procedure: CATARACT EXTRACTION PHACO AND INTRAOCULAR LENS PLACEMENT (IOC)-RIGHT DIABETIC;  Surgeon: Eulogio Bear, MD;  Location: [facility];  Service: Ophthalmology;  Laterality: Right;  [place] 00:30.8 AP% 8.9 CDE 2.75 FLUID PACK LOT # [id number]  . CATARACT EXTRACTION W/PHACO Left 11/04/2017   Procedure: CATARACT EXTRACTION PHACO AND INTRAOCULAR LENS PLACEMENT (IOC);  Surgeon: Eulogio Bear, MD;  Location: [facility];  Service: Ophthalmology;  Laterality: Left;  [place] 00:30.0 AP% 8.0 CDE 2.41 Fluid Pack lot # [id number]  . CHOLECYSTECTOMY    . METATARSAL HEAD EXCISION Right 04/14/2018   Procedure: METATARSAL HEAD EXCISION-1ST, 2ND & 5TH MET HEADS;  Surgeon: Albertine Patricia, DPM;  Location: [facility];  Service: Podiatry;  Laterality: Right;  . PERIPHERAL VASCULAR CATHETERIZATION N/A 01/22/2016   Procedure: Dialysis/Perma Catheter Insertion;  Surgeon: Katha Cabal, MD;  Location: [facility];  Service: Cardiovascular;  Laterality: N/A;  . PERIPHERAL VASCULAR CATHETERIZATION Left 05/14/2016   Procedure: A/V Shuntogram/Fistulagram;  Surgeon: Algernon Huxley, MD;  Location: [facility];  Service: Cardiovascular;   Laterality: Left;  . PERIPHERAL VASCULAR CATHETERIZATION N/A 05/14/2016   Procedure: A/V Shunt Intervention;  Surgeon: Algernon Huxley, MD;  Location: [facility];  Service: Cardiovascular;  Laterality: N/A;  . PERIPHERAL VASCULAR CATHETERIZATION N/A 06/25/2016   Procedure: Dialysis/Perma Catheter Removal;  Surgeon: Algernon Huxley, MD;  Location: [facility];  Service: Cardiovascular;  Laterality: N/A;  . PERIPHERAL VASCULAR CATHETERIZATION Left 08/03/2016   Procedure: A/V Shuntogram/Fistulagram;  Surgeon: Algernon Huxley, MD;  Location: [facility];  Service: Cardiovascular;  Laterality: Left;  . PERIPHERAL VASCULAR CATHETERIZATION N/A 08/03/2016   Procedure: A/V Shunt Intervention;  Surgeon: Algernon Huxley, MD;  Location: [facility];  Service: Cardiovascular;  Laterality: N/A;  . PERIPHERAL VASCULAR CATHETERIZATION Left 08/07/2016   Procedure: A/V Shuntogram/Fistulagram;  Surgeon: Katha Cabal, MD;  Location: [facility];  Service: Cardiovascular;  Laterality: Left;  . PERIPHERAL VASCULAR CATHETERIZATION Left 09/22/2016   Procedure: A/V Fistulagram;  Surgeon: Katha Cabal, MD;  Location: [facility];  Service: Cardiovascular;  Laterality: Left;  . THYROIDECTOMY      Social History   Socioeconomic History  . Marital status: Single    Spouse name: Not on file  . Number of children: Not on file  . Years of education: Not on file  . Highest education level: Not on file  Occupational History  . Not on file  Social Needs  . Financial resource strain: Not on file  . Food insecurity:    Worry: Not on file    Inability: Not on file  . Transportation needs:    Medical: Not on file    Non-medical: Not on file  Tobacco Use  . Smoking status: Never Smoker  . Smokeless tobacco: Never Used  Substance and Sexual Activity  . Alcohol use: No    Alcohol/week: 0.0 standard drinks  . Drug use: No  . Sexual activity: Not Currently  Lifestyle  .  Physical activity:    Days per week: Not on file    Minutes per session: Not on file  . Stress: Not on file  Relationships  . Social connections:    Talks on phone: Not on file    Gets together: Not on file    Attends religious service: Not on file    Active member of club or organization: Not on file    Attends meetings of clubs or organizations: Not on file    Relationship status: Not on file  . Intimate partner violence:    Fear of current or ex partner: Not on file    Emotionally abused: Not on file    Physically abused: Not on file    Forced sexual activity: Not on file  Other Topics Concern  . Not on file  Social History Narrative   Single lives alone.  She is employed. She has no impairements    Family History  Problem Relation Age of Onset  . Renal Disease Brother        with renal transplant  . Diabetes Mellitus II Father   . Diabetes Mellitus II  Mother   . Diabetes Mellitus II Brother   . Breast cancer Neg Hx     No Known Allergies   Review of Systems   Review of Systems: Negative Unless Checked Constitutional: _0 Weight loss  _1 Fever  _2 Chills Cardiac: _3 Chest pain   _4  Atrial Fibrillation  _5 Palpitations   _6 Shortness of breath when laying flat   _7 Shortness of breath with exertion. Vascular:  _8 Pain in legs with walking   _9 Pain in legs with standing  _10 History of DVT   _11 Phlebitis   _12 Swelling in legs   _13 Varicose veins   _14 Non-healing ulcers Pulmonary:   _15 Uses home oxygen   _16 Productive cough   _17 Hemoptysis   _18 Wheeze  _19 COPD   _20 Asthma Neurologic:  _21 Dizziness   _22 Seizures   _23 History of stroke   _24 History of TIA  _25 Aphasia   _26 Vissual changes   _27 Weakness or numbness in arm   _28 Weakness or numbness in leg Musculoskeletal:   _29 Joint swelling   _30 Joint pain   _31 Low back pain  _32  History of Knee Replacement Hematologic:  _33 Easy bruising  _34 Easy bleeding   _35 Hypercoagulable state   _36 Anemic Gastrointestinal:  _37 Diarrhea   _38 Vomiting  _39 Gastroesophageal  reflux/heartburn   _40 Difficulty swallowing. Genitourinary:  _41 Chronic kidney disease   _42 Difficult urination  _43 Anuric   _44 Blood in urine Skin:  _45 Rashes   _46 Ulcers  Psychological:  _47 History of anxiety   _48  History of major depression  _49  Memory Difficulties     Objective:   Physical Exam  BP (!) 144/75 (BP Location: Right Arm, Patient Position: Sitting)   Pulse 76   Resp 18   Ht _50  (1.676 m)   Wt 220 lb (99.8 kg)   BMI 35.51 kg/m   Gen: WD/WN, NAD Head: /AT, No temporalis wasting.  Ear/Nose/Throat: Hearing grossly intact, nares w/o erythema or drainage Eyes: PER, EOMI, sclera nonicteric.  Neck: Supple, no masses.  No JVD.  Pulmonary:  Good air movement, no use of accessory muscles.  Cardiac: RRR Vascular:  Soft thrill and bruit Vessel Right Left  Radial Palpable Palpable   Gastrointestinal: soft, non-distended. No guarding/no peritoneal signs.  Musculoskeletal: M/S 5/5 throughout.  No deformity or atrophy.  Neurologic: Pain and light touch intact in extremities.  Symmetrical.  Speech is fluent. Motor exam as listed above. Psychiatric: Judgment intact, Mood & affect appropriate for pt's clinical situation. Dermatologic: No Venous rashes. No Ulcers Noted.  No changes consistent with cellulitis. Lymph : No Cervical lymphadenopathy, no lichenification or skin changes of chronic lymphedema.      Assessment & Plan:   1. ESRD on dialysis [facility]) The patient underwent a HDA today which had a flow volume of 1026.  The velocities in the mid upper arm were 609 with a vessel diameter side of 0.34.  There is also a change in the flow of the antecubital fossa with increased velocities of 312.   Recommend:  The patient is experiencing increasing problems with their dialysis access.  Patient should have a fistulagram with the intention for intervention.  The intention for intervention is to restore appropriate flow and prevent thrombosis and possible loss of the access.  As well  as improve the quality of dialysis therapy.  The risks, benefits and alternative therapies were reviewed in detail with the patient.  All questions were answered.  The patient agrees to proceed with angio/intervention.      2. Type 2 diabetes mellitus with chronic kidney disease on chronic dialysis, unspecified whether long term insulin use (HCC) Continue hypoglycemic medications  as already ordered, these medications have been reviewed and there are no changes at this time.  Hgb A1C to be monitored as already arranged by primary service   3. Essential hypertension Continue antihypertensive medications as already ordered, these medications have been reviewed and there are no changes at this time.    Current Outpatient Medications on File Prior to Visit  Medication Sig Dispense Refill  . atorvastatin (LIPITOR) 40 MG tablet Take 40 mg by mouth at bedtime.    . B Complex-C-Folic Acid (RENA-VITE RX PO) Take by mouth.    . calcium acetate (PHOSLO) 667 MG capsule Take 1,334 mg by mouth 3 (three) times daily with meals.     . carvedilol (COREG) 6.25 MG tablet Take 12.5 mg by mouth at bedtime.   5  . clopidogrel (PLAVIX) 75 MG tablet Take 75 mg by mouth daily with breakfast.     . docusate sodium (COLACE) 50 MG capsule Take 200 mg by mouth 2 (two) times daily.     . Dulaglutide (TRULICITY) 9.32 UN/9.9VA SOPN Inject 0.75 mg into the skin once a week. Takes on saturdays    . enalapril (VASOTEC) 20 MG tablet Take 20 mg by mouth at bedtime.   5  . fosinopril (MONOPRIL) 40 MG tablet     . furosemide (LASIX) 80 MG tablet Take 80 mg by mouth every Tuesday, Thursday, Saturday, and Sunday. In the morning    . HYDROcodone-acetaminophen (NORCO) 7.5-325 MG tablet Take 1 tablet by mouth every 6 (six) hours as needed for moderate pain. 30 tablet 0  . insulin detemir (LEVEMIR) 100 UNIT/ML injection Inject 0.16 mLs (16 Units total) into the skin at bedtime. 10 mL 11  . Insulin Glargine (LANTUS SOLOSTAR) 100  UNIT/ML Solostar Pen Inject 20 Units into the skin daily at 10 pm.    . levothyroxine (SYNTHROID, LEVOTHROID) 175 MCG tablet Take 175 mcg by mouth daily before breakfast. 1 hour before breakfast  3  . lidocaine-prilocaine (EMLA) cream Apply 1 application topically as needed (port access).    . multivitamin (RENA-VIT) TABS tablet Take 1 tablet by mouth daily with breakfast.     . polyethylene glycol (MIRALAX / GLYCOLAX) packet Take 17 g by mouth daily. (Patient taking differently: Take 17 g by mouth 2 (two) times daily. ) 14 each 0  . Insulin Pen Needle (NOVOFINE PLUS) 32G X 4 MM MISC Use 1 Needle free injection nightly     No current facility-administered medications on file prior to visit.     There are no Patient Instructions on file for this visit. No follow-ups on file.   Kris Hartmann, NP  This note was completed with Sales executive.  Any errors are purely unintentional.

## 2018-08-23 ENCOUNTER — Other Ambulatory Visit (INDEPENDENT_AMBULATORY_CARE_PROVIDER_SITE_OTHER): Payer: Self-pay | Admitting: Nurse Practitioner

## 2018-08-24 ENCOUNTER — Ambulatory Visit
Admission: RE | Admit: 2018-08-24 | Discharge: 2018-08-24 | Disposition: A | Discharge status: Home / Self Care | Payer: Medicare Other | Source: Ambulatory Visit | Attending: Vascular Surgery | Admitting: Vascular Surgery

## 2018-08-24 ENCOUNTER — Encounter
Admission: RE | Disposition: A | Discharge status: Home / Self Care | Payer: Self-pay | Source: Ambulatory Visit | Attending: Vascular Surgery

## 2018-08-24 DIAGNOSIS — N186 End stage renal disease: Secondary | ICD-10-CM | POA: Diagnosis not present

## 2018-08-24 DIAGNOSIS — E1140 Type 2 diabetes mellitus with diabetic neuropathy, unspecified: Secondary | ICD-10-CM | POA: Diagnosis not present

## 2018-08-24 DIAGNOSIS — Z9889 Other specified postprocedural states: Secondary | ICD-10-CM | POA: Diagnosis not present

## 2018-08-24 DIAGNOSIS — Z833 Family history of diabetes mellitus: Secondary | ICD-10-CM | POA: Insufficient documentation

## 2018-08-24 DIAGNOSIS — Z79899 Other long term (current) drug therapy: Secondary | ICD-10-CM | POA: Diagnosis not present

## 2018-08-24 DIAGNOSIS — M19012 Primary osteoarthritis, left shoulder: Secondary | ICD-10-CM | POA: Diagnosis not present

## 2018-08-24 DIAGNOSIS — Z90710 Acquired absence of both cervix and uterus: Secondary | ICD-10-CM | POA: Diagnosis not present

## 2018-08-24 DIAGNOSIS — Z7902 Long term (current) use of antithrombotics/antiplatelets: Secondary | ICD-10-CM | POA: Diagnosis not present

## 2018-08-24 DIAGNOSIS — Z841 Family history of disorders of kidney and ureter: Secondary | ICD-10-CM | POA: Diagnosis not present

## 2018-08-24 DIAGNOSIS — Y832 Surgical operation with anastomosis, bypass or graft as the cause of abnormal reaction of the patient, or of later complication, without mention of misadventure at the time of the procedure: Secondary | ICD-10-CM | POA: Insufficient documentation

## 2018-08-24 DIAGNOSIS — Z794 Long term (current) use of insulin: Secondary | ICD-10-CM | POA: Insufficient documentation

## 2018-08-24 DIAGNOSIS — I120 Hypertensive chronic kidney disease with stage 5 chronic kidney disease or end stage renal disease: Secondary | ICD-10-CM | POA: Diagnosis not present

## 2018-08-24 DIAGNOSIS — Z992 Dependence on renal dialysis: Secondary | ICD-10-CM | POA: Insufficient documentation

## 2018-08-24 DIAGNOSIS — E1122 Type 2 diabetes mellitus with diabetic chronic kidney disease: Secondary | ICD-10-CM | POA: Insufficient documentation

## 2018-08-24 DIAGNOSIS — Z9049 Acquired absence of other specified parts of digestive tract: Secondary | ICD-10-CM | POA: Insufficient documentation

## 2018-08-24 DIAGNOSIS — K219 Gastro-esophageal reflux disease without esophagitis: Secondary | ICD-10-CM | POA: Insufficient documentation

## 2018-08-24 DIAGNOSIS — T82858A Stenosis of vascular prosthetic devices, implants and grafts, initial encounter: Secondary | ICD-10-CM | POA: Insufficient documentation

## 2018-08-24 DIAGNOSIS — Z8619 Personal history of other infectious and parasitic diseases: Secondary | ICD-10-CM | POA: Insufficient documentation

## 2018-08-24 DIAGNOSIS — M19011 Primary osteoarthritis, right shoulder: Secondary | ICD-10-CM | POA: Insufficient documentation

## 2018-08-24 DIAGNOSIS — N2581 Secondary hyperparathyroidism of renal origin: Secondary | ICD-10-CM | POA: Insufficient documentation

## 2018-08-24 DIAGNOSIS — E039 Hypothyroidism, unspecified: Secondary | ICD-10-CM | POA: Insufficient documentation

## 2018-08-24 HISTORY — PX: A/V FISTULAGRAM: CATH118298

## 2018-08-24 LAB — GLUCOSE, CAPILLARY: Glucose-Capillary: 83 mg/dL (ref 70–99)

## 2018-08-24 LAB — POTASSIUM (ARMC VASCULAR LAB ONLY): POTASSIUM (ARMC VASCULAR LAB): 4.7 (ref 3.5–5.1)

## 2018-08-24 SURGERY — A/V FISTULAGRAM
Anesthesia: Moderate Sedation | Laterality: Left

## 2018-08-24 MED ORDER — FENTANYL CITRATE (PF) 100 MCG/2ML IJ SOLN
INTRAMUSCULAR | Status: AC
  Filled 2018-08-24: qty 2

## 2018-08-24 MED ORDER — SODIUM CHLORIDE 0.9 % IV SOLN: INTRAVENOUS | Status: DC

## 2018-08-24 MED ORDER — MIDAZOLAM HCL 2 MG/2ML IJ SOLN
INTRAMUSCULAR | Status: DC | PRN
  Administered 2018-08-24: 2 mg via INTRAVENOUS

## 2018-08-24 MED ORDER — FENTANYL CITRATE (PF) 100 MCG/2ML IJ SOLN
INTRAMUSCULAR | Status: DC | PRN
  Administered 2018-08-24: 50 ug via INTRAVENOUS

## 2018-08-24 MED ORDER — CEFAZOLIN SODIUM-DEXTROSE 1-4 GM/50ML-% IV SOLN
INTRAVENOUS | Status: AC
  Administered 2018-08-24: 1 g via INTRAVENOUS
  Filled 2018-08-24: qty 50

## 2018-08-24 MED ORDER — MIDAZOLAM HCL 5 MG/5ML IJ SOLN
INTRAMUSCULAR | Status: AC
  Filled 2018-08-24: qty 5

## 2018-08-24 MED ORDER — HYDROMORPHONE HCL 1 MG/ML IJ SOLN: 1.0000 mg | Freq: Once | INTRAMUSCULAR | Status: DC | PRN

## 2018-08-24 MED ORDER — CEFAZOLIN SODIUM-DEXTROSE 1-4 GM/50ML-% IV SOLN
1.0000 g | Freq: Once | INTRAVENOUS | Status: AC
  Administered 2018-08-24: 1 g via INTRAVENOUS

## 2018-08-24 MED ORDER — IOPAMIDOL (ISOVUE-300) INJECTION 61%
INTRAVENOUS | Status: DC | PRN
  Administered 2018-08-24: 25 mL via INTRA_ARTERIAL

## 2018-08-24 MED ORDER — ONDANSETRON HCL 4 MG/2ML IJ SOLN: 4.0000 mg | Freq: Four times a day (QID) | INTRAMUSCULAR | Status: DC | PRN

## 2018-08-24 MED ORDER — HEPARIN SODIUM (PORCINE) 1000 UNIT/ML IJ SOLN
INTRAMUSCULAR | Status: DC | PRN
  Administered 2018-08-24: 3000 [IU] via INTRAVENOUS

## 2018-08-24 MED ORDER — HEPARIN SODIUM (PORCINE) 1000 UNIT/ML IJ SOLN
INTRAMUSCULAR | Status: AC
  Filled 2018-08-24: qty 1

## 2018-08-24 SURGICAL SUPPLY — 12 items
BALLN DORADO7X100X80 (BALLOONS) ×3
BALLN LUTONIX 7X80X130 (BALLOONS) ×3
BALLOON DORADO7X100X80 (BALLOONS) ×1 IMPLANT
BALLOON LUTONIX 7X80X130 (BALLOONS) ×1 IMPLANT
CANNULA 5F STIFF (CANNULA) ×3 IMPLANT
COVER DRAPE FLUORO 36X44 (DRAPES) ×3 IMPLANT
COVER PROBE U/S 5X48 (MISCELLANEOUS) ×3 IMPLANT
DEVICE PRESTO INFLATION (MISCELLANEOUS) ×3 IMPLANT
PACK ANGIOGRAPHY (CUSTOM PROCEDURE TRAY) ×3 IMPLANT
SHEATH BRITE TIP 6FRX5.5 (SHEATH) ×3 IMPLANT
SUT MNCRL AB 4-0 PS2 18 (SUTURE) ×3 IMPLANT
WIRE MAGIC TOR.035 180C (WIRE) ×3 IMPLANT

## 2018-08-24 NOTE — H&P (Signed)
[facility] History & Physical Update  The patient was interviewed and re-examined.  The patient's previous History and Physical has been reviewed and is unchanged.  There is no change in the plan of care. We plan to proceed with the scheduled procedure.  Leotis Pain, MD  08/24/2018, 9:28 AM

## 2018-08-24 NOTE — Op Note (Addendum)
[facility]    OPERATIVE NOTE   PROCEDURE: 1.   Left brachiocephalic arteriovenous fistula cannulation under ultrasound guidance 2.   Left arm fistulagram including central venogram 3.   Percutaneous transluminal angioplasty of mid upper arm cephalic vein stenosis with 7 million diameter Lutonix drug-coated and high-pressure angioplasty balloons  PRE-OPERATIVE DIAGNOSIS: 1. ESRD 2. Poorly functional left brachiocephalic AVF  POST-OPERATIVE DIAGNOSIS: same as above   SURGEON: Leotis Pain, MD  ANESTHESIA: local with MCS  ESTIMATED BLOOD LOSS: 5 cc  FINDING(S): 1. 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  The remainder of the fistula was widely patent without focal stenosis identified  SPECIMEN(S):  None  CONTRAST: 25 cc  FLUORO TIME: 2.0 minutes  MODERATE CONSCIOUS SEDATION TIME: Approximately 15 minutes with 3 mg of Versed and 75 mcg of Fentanyl   INDICATIONS: Kendra [name] is a 67 y.o. female who presents with malfunctioning left brachiocephalic arteriovenous fistula.  The patient is scheduled for left arm fistulagram.  The patient is aware the risks include but are not limited to: bleeding, infection, thrombosis of the cannulated access, and possible anaphylactic reaction to the contrast.  The patient is aware of the risks of the procedure and elects to proceed forward.  DESCRIPTION: After full informed written consent was obtained, the patient was brought back to the angiography suite and placed supine upon the angiography table.  The patient was connected to monitoring equipment. Moderate conscious sedation was administered with a face to face encounter with the patient throughout the procedure with my supervision of the RN administering medicines and monitoring the patient's vital signs and mental status throughout from the start of the procedure until the patient was  taken to the recovery room. The left arm was prepped and draped in the standard fashion for a percutaneous access intervention.  Under ultrasound guidance, the left brachiocephalic arteriovenous fistula was cannulated with a micropuncture needle under direct ultrasound guidance where it was patent and a permanent image was performed.  The microwire was advanced into the fistula and the needle was exchanged for the a microsheath.  I then upsized to a 6 Fr Sheath and imaging was performed.  Hand injections were completed to image the access including the central venous system. 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  The remainder of the fistula was widely patent without focal stenosis identified.  Based on the images, this patient will need intervention. I then gave the patient 3000 units of intravenous heparin.  I then crossed the stenosis with a Magic Tourqe wire.  Based on the imaging, a 7 mm x 8 cm Lutonix drug-coated angioplasty balloon was selected.  The balloon was centered around the mid upper arm cephalic vein stenosis and inflated to burst inflation of 14 ATM for 1 minute(s).  The waist was never completely broken, so we used a 7 mm diameter by 10 cm length high-pressure angioplasty balloon and it took 30 atm to relieve the waist.  This was held for 1 minute.  On completion imaging, a 15-20 % residual stenosis was present.     Based on the completion imaging, no further intervention is necessary.  The wire and balloon were removed from the sheath.  A 4-0 Monocryl purse-string suture was sewn around the sheath.  The sheath was removed while tying down the suture.  A sterile bandage  was applied to the puncture site.  COMPLICATIONS: None  CONDITION: Stable   Leotis Pain  08/24/2018 11:31 AM   This note was created with Dragon Medical transcription system. Any errors in dictation are purely  unintentional.

## 2018-08-25 ENCOUNTER — Encounter: Payer: Self-pay | Admitting: Vascular Surgery

## 2018-10-04 ENCOUNTER — Ambulatory Visit (INDEPENDENT_AMBULATORY_CARE_PROVIDER_SITE_OTHER): Payer: Medicare Other | Admitting: Vascular Surgery

## 2018-10-04 ENCOUNTER — Encounter (INDEPENDENT_AMBULATORY_CARE_PROVIDER_SITE_OTHER): Payer: Medicare Other

## 2018-10-13 ENCOUNTER — Ambulatory Visit (INDEPENDENT_AMBULATORY_CARE_PROVIDER_SITE_OTHER): Payer: Medicare Other | Admitting: Nurse Practitioner

## 2018-10-13 ENCOUNTER — Ambulatory Visit (INDEPENDENT_AMBULATORY_CARE_PROVIDER_SITE_OTHER): Payer: Medicare Other

## 2018-10-13 ENCOUNTER — Encounter (INDEPENDENT_AMBULATORY_CARE_PROVIDER_SITE_OTHER): Payer: Self-pay | Admitting: Nurse Practitioner

## 2018-10-13 ENCOUNTER — Other Ambulatory Visit (INDEPENDENT_AMBULATORY_CARE_PROVIDER_SITE_OTHER): Payer: Self-pay | Admitting: Vascular Surgery

## 2018-10-13 VITALS — BP 157/80 | HR 78 | Resp 14 | Ht 66.0 in | Wt 223.8 lb

## 2018-10-13 DIAGNOSIS — I120 Hypertensive chronic kidney disease with stage 5 chronic kidney disease or end stage renal disease: Secondary | ICD-10-CM | POA: Diagnosis not present

## 2018-10-13 DIAGNOSIS — I10 Essential (primary) hypertension: Secondary | ICD-10-CM

## 2018-10-13 DIAGNOSIS — E1122 Type 2 diabetes mellitus with diabetic chronic kidney disease: Secondary | ICD-10-CM

## 2018-10-13 DIAGNOSIS — Z992 Dependence on renal dialysis: Secondary | ICD-10-CM | POA: Diagnosis not present

## 2018-10-13 DIAGNOSIS — N186 End stage renal disease: Secondary | ICD-10-CM

## 2018-10-13 NOTE — Progress Notes (Signed)
Subjective:    Patient ID: Kendra [name], female    DOB: 1951-01-02, 67 y.o.   MRN: [id number] Chief Complaint  Patient presents with  . Follow-up    HPI  [name] is a 67 y.o. female The patient returns to the office for followup status post intervention of the dialysis access left brachiocephalic AV fistula. Following the intervention the access function has significantly improved, with better flow rates and improved KT/V. The patient has not been experiencing increased bleeding times following decannulation and the patient denies increased recirculation. The patient denies an increase in arm swelling. At the present time the patient denies hand pain.  The patient denies amaurosis fugax or recent TIA symptoms. There are no recent neurological changes noted. The patient denies claudication symptoms or rest pain symptoms. The patient denies history of DVT, PE or superficial thrombophlebitis. The patient denies recent episodes of angina or shortness of breath.   The patient underwent a hemodialysis access duplex today with a flow volume of 1874.  Previous flow volume was 1026 on 08/22/2018.  The patient has a patent left brachiocephalic AV fistula with increased velocities in the proximal AV fistula and improved flow to the mid AV fistula status post percutaneous transluminal angioplasty.     Past Medical History:  Diagnosis Date  . Anemia of chronic renal failure    DIALYSIS m/w/f  . Diabetic neuropathy ([place])   . Diabetic retinopathy ([place])   . GERD (gastroesophageal reflux disease)   . Hyperparathyroidism, secondary renal ([place])   . Hypertension   . Hypothyroidism   . Insulin dependent diabetes mellitus ([place])   . Neuropathy    feet and fingers  . Osteoarthritis    bilateral shoulders  . Peripheral vascular disease ([place])   . Primary hypertension   . Shingles outbreak 04/2017    Past Surgical History:  Procedure Laterality Date  . A/V FISTULAGRAM Left 08/19/2017   Procedure: A/V Fistulagram;  Surgeon: Algernon Huxley, MD;  Location: [facility];  Service: Cardiovascular;  Laterality: Left;  . A/V FISTULAGRAM N/A 03/10/2018   Procedure: A/V FISTULAGRAM;  Surgeon: Algernon Huxley, MD;  Location: [facility];  Service: Cardiovascular;  Laterality: N/A;  . A/V FISTULAGRAM Left 08/24/2018   Procedure: A/V FISTULAGRAM;  Surgeon: Algernon Huxley, MD;  Location: [facility];  Service: Cardiovascular;  Laterality: Left;  . ABDOMINAL HYSTERECTOMY    . ARTHRODESIS METATARSALPHALANGEAL JOINT (MTPJ) Right 04/14/2018   Procedure: ARTHRODESIS; HALLUX/IP JOINT;  Surgeon: Albertine Patricia, DPM;  Location: [facility];  Service: Podiatry;  Laterality: Right;  . BASCILIC VEIN TRANSPOSITION Left 03/04/2016   Procedure: BASCILIC VEIN TRANSPOSITION ( STAGE 1 );  Surgeon: Algernon Huxley, MD;  Location: [facility];  Service: Vascular;  Laterality: Left;  . CATARACT EXTRACTION W/PHACO Right 10/07/2017   Procedure: CATARACT EXTRACTION PHACO AND INTRAOCULAR LENS PLACEMENT (IOC)-RIGHT DIABETIC;  Surgeon: Eulogio Bear, MD;  Location: [facility];  Service: Ophthalmology;  Laterality: Right;  [place] 00:30.8 AP% 8.9 CDE 2.75 FLUID PACK LOT # [id number]  . CATARACT EXTRACTION W/PHACO Left 11/04/2017   Procedure: CATARACT EXTRACTION PHACO AND INTRAOCULAR LENS PLACEMENT (IOC);  Surgeon: Eulogio Bear, MD;  Location: [facility];  Service: Ophthalmology;  Laterality: Left;  [place] 00:30.0 AP% 8.0 CDE 2.41 Fluid Pack lot # [id number]  . CHOLECYSTECTOMY    . METATARSAL HEAD EXCISION Right 04/14/2018   Procedure: METATARSAL HEAD EXCISION-1ST, 2ND & 5TH MET HEADS;  Surgeon: Albertine Patricia, DPM;  Location: [facility];  Service: Podiatry;  Laterality: Right;  . PERIPHERAL VASCULAR CATHETERIZATION N/A 01/22/2016   Procedure: Dialysis/Perma Catheter Insertion;  Surgeon: Katha Cabal, MD;  Location: [facility];  Service: Cardiovascular;  Laterality: N/A;  . PERIPHERAL VASCULAR  CATHETERIZATION Left 05/14/2016   Procedure: A/V Shuntogram/Fistulagram;  Surgeon: Algernon Huxley, MD;  Location: [facility];  Service: Cardiovascular;  Laterality: Left;  . PERIPHERAL VASCULAR CATHETERIZATION N/A 05/14/2016   Procedure: A/V Shunt Intervention;  Surgeon: Algernon Huxley, MD;  Location: [facility];  Service: Cardiovascular;  Laterality: N/A;  . PERIPHERAL VASCULAR CATHETERIZATION N/A 06/25/2016   Procedure: Dialysis/Perma Catheter Removal;  Surgeon: Algernon Huxley, MD;  Location: [facility];  Service: Cardiovascular;  Laterality: N/A;  . PERIPHERAL VASCULAR CATHETERIZATION Left 08/03/2016   Procedure: A/V Shuntogram/Fistulagram;  Surgeon: Algernon Huxley, MD;  Location: [facility];  Service: Cardiovascular;  Laterality: Left;  . PERIPHERAL VASCULAR CATHETERIZATION N/A 08/03/2016   Procedure: A/V Shunt Intervention;  Surgeon: Algernon Huxley, MD;  Location: [facility];  Service: Cardiovascular;  Laterality: N/A;  . PERIPHERAL VASCULAR CATHETERIZATION Left 08/07/2016   Procedure: A/V Shuntogram/Fistulagram;  Surgeon: Katha Cabal, MD;  Location: [facility];  Service: Cardiovascular;  Laterality: Left;  . PERIPHERAL VASCULAR CATHETERIZATION Left 09/22/2016   Procedure: A/V Fistulagram;  Surgeon: Katha Cabal, MD;  Location: [facility];  Service: Cardiovascular;  Laterality: Left;  . THYROIDECTOMY      Social History   Socioeconomic History  . Marital status: Single    Spouse name: Not on file  . Number of children: Not on file  . Years of education: Not on file  . Highest education level: Not on file  Occupational History  . Not on file  Social Needs  . Financial resource strain: Not on file  . Food insecurity:    Worry: Not on file    Inability: Not on file  . Transportation needs:    Medical: Not on file    Non-medical: Not on file  Tobacco Use  . Smoking status: Never Smoker  . Smokeless tobacco: Never Used    Substance and Sexual Activity  . Alcohol use: No    Alcohol/week: 0.0 standard drinks  . Drug use: No  . Sexual activity: Not Currently  Lifestyle  . Physical activity:    Days per week: Not on file    Minutes per session: Not on file  . Stress: Not on file  Relationships  . Social connections:    Talks on phone: Not on file    Gets together: Not on file    Attends religious service: Not on file    Active member of club or organization: Not on file    Attends meetings of clubs or organizations: Not on file    Relationship status: Not on file  . Intimate partner violence:    Fear of current or ex partner: Not on file    Emotionally abused: Not on file    Physically abused: Not on file    Forced sexual activity: Not on file  Other Topics Concern  . Not on file  Social History Narrative   Single lives alone.  She is employed. She has no impairements    Family History  Problem Relation Age of Onset  . Renal Disease Brother        with renal transplant  . Diabetes Mellitus II Father   .  Diabetes Mellitus II Mother   . Diabetes Mellitus II Brother   . Breast cancer Neg Hx     No Known Allergies   Review of Systems   Review of Systems: Negative Unless Checked Constitutional: '[]'$ Weight loss  '[]'$ Fever  '[]'$ Chills Cardiac: '[]'$ Chest pain   '[]'$  Atrial Fibrillation  '[]'$ Palpitations   '[]'$ Shortness of breath when laying flat   '[]'$ Shortness of breath with exertion. '[]'$ Shortness of breath at rest Vascular:  '[]'$ Pain in legs with walking   '[]'$ Pain in legs with standing '[]'$ Pain in legs when laying flat   '[]'$ Claudication    '[]'$ Pain in feet when laying flat    '[]'$ History of DVT   '[]'$ Phlebitis   '[]'$ Swelling in legs   '[]'$ Varicose veins   '[]'$ Non-healing ulcers Pulmonary:   '[]'$ Uses home oxygen   '[]'$ Productive cough   '[]'$ Hemoptysis   '[]'$ Wheeze  '[]'$ COPD   '[]'$ Asthma Neurologic:  '[]'$ Dizziness   '[]'$ Seizures  '[]'$ Blackouts '[]'$ History of stroke   '[]'$ History of TIA  '[]'$ Aphasia   '[]'$ Temporary Blindness   '[]'$ Weakness or numbness in arm    '[]'$ Weakness or numbness in leg Musculoskeletal:   '[x]'$ Joint swelling   '[x]'$ Joint pain   '[]'$ Low back pain  '[]'$  History of Knee Replacement '[x]'$ Arthritis '[]'$ back Surgeries  '[]'$  Spinal Stenosis    Hematologic:  '[]'$ Easy bruising  '[]'$ Easy bleeding   '[]'$ Hypercoagulable state   '[]'$ Anemic Gastrointestinal:  '[]'$ Diarrhea   '[]'$ Vomiting  '[x]'$ Gastroesophageal reflux/heartburn   '[]'$ Difficulty swallowing. '[]'$ Abdominal pain Genitourinary:  '[]'$ Chronic kidney disease   '[]'$ Difficult urination  '[]'$ Anuric   '[]'$ Blood in urine '[]'$ Frequent urination  '[]'$ Burning with urination   '[]'$ Hematuria Skin:  '[]'$ Rashes   '[]'$ Ulcers '[]'$ Wounds Psychological:  '[]'$ History of anxiety   '[]'$  History of major depression  '[]'$  Memory Difficulties     Objective:   Physical Exam  BP (!) 157/80 (BP Location: Right Arm, Patient Position: Sitting)   Pulse 78   Resp 14   Ht '5\' 6"'$  (1.676 m)   Wt 223 lb 12.8 oz (101.5 kg)   BMI 36.12 kg/m   Gen: WD/WN, NAD Head: [place]/AT, No temporalis wasting.  Ear/Nose/Throat: Hearing grossly intact, nares w/o erythema or drainage Eyes: PER, EOMI, sclera nonicteric.  Neck: Supple, no masses.  No JVD.  Pulmonary:  Good air movement, no use of accessory muscles.  Cardiac: RRR Vascular:  Good thrill and bruit Vessel Right Left  Radial Palpable Palpable   Gastrointestinal: soft, non-distended. No guarding/no peritoneal signs.  Musculoskeletal: M/S 5/5 throughout.  No deformity or atrophy.  Neurologic: Pain and light touch intact in extremities.  Symmetrical.  Speech is fluent. Motor exam as listed above. Psychiatric: Judgment intact, Mood & affect appropriate for pt's clinical situation. Dermatologic: No Venous rashes. No Ulcers Noted.  No changes consistent with cellulitis. Lymph : No Cervical lymphadenopathy, no lichenification or skin changes of chronic lymphedema.      Assessment & Plan:   1. ESRD on dialysis [facility]) The patient underwent a hemodialysis access duplex today with a flow volume of 1874.  Previous flow volume  was 1026 on 08/22/2018.  The patient has a patent left brachiocephalic AV fistula with increased velocities in the proximal AV fistula and improved flow to the mid AV fistula status post percutaneous transluminal angioplasty.  Recommend:  The patient is doing well and currently has adequate dialysis access. The patient's dialysis center is not reporting any access issues. Flow pattern is stable when compared to the prior ultrasound.  The patient should have a duplex ultrasound of the dialysis access in 3 months. The patient will  follow-up with me in the office after each ultrasound    - VAS [place] (AVF, AVG); Future  2. Type 2 diabetes mellitus with chronic kidney disease on chronic dialysis, unspecified whether long term insulin use ([place]) Continue hypoglycemic medications as already ordered, these medications have been reviewed and there are no changes at this time.  Hgb A1C to be monitored as already arranged by primary service   3. Essential hypertension Continue antihypertensive medications as already ordered, these medications have been reviewed and there are no changes at this time.    Current Outpatient Medications on File Prior to Visit  Medication Sig Dispense Refill  . atorvastatin (LIPITOR) 40 MG tablet Take 40 mg by mouth at bedtime.    . B Complex-C-Folic Acid (RENA-VITE RX PO) Take by mouth.    . calcium acetate (PHOSLO) 667 MG capsule Take 1,334 mg by mouth 3 (three) times daily with meals.     . carvedilol (COREG) 6.25 MG tablet Take 12.5 mg by mouth at bedtime.   5  . clopidogrel (PLAVIX) 75 MG tablet Take 75 mg by mouth daily with breakfast.     . docusate sodium (COLACE) 50 MG capsule Take 200 mg by mouth 2 (two) times daily.     . Dulaglutide (TRULICITY) 2.72 ZD/6.6YQ SOPN Inject 0.75 mg into the skin once a week. Takes on saturdays    . enalapril (VASOTEC) 20 MG tablet Take 20 mg by mouth at bedtime.   5  . fosinopril (MONOPRIL) 40 MG tablet      . furosemide (LASIX) 80 MG tablet Take 80 mg by mouth every Tuesday, Thursday, Saturday, and Sunday. In the morning    . HYDROcodone-acetaminophen (NORCO) 7.5-325 MG tablet Take 1 tablet by mouth every 6 (six) hours as needed for moderate pain. 30 tablet 0  . insulin detemir (LEVEMIR) 100 UNIT/ML injection Inject 0.16 mLs (16 Units total) into the skin at bedtime. 10 mL 11  . Insulin Glargine (LANTUS SOLOSTAR) 100 UNIT/ML Solostar Pen Inject 20 Units into the skin daily at 10 pm.    . Insulin Pen Needle (NOVOFINE PLUS) 32G X 4 MM MISC Use 1 Needle free injection nightly    . levothyroxine (SYNTHROID, LEVOTHROID) 175 MCG tablet Take 175 mcg by mouth daily before breakfast. 1 hour before breakfast  3  . lidocaine-prilocaine (EMLA) cream Apply 1 application topically as needed (port access).    . multivitamin (RENA-VIT) TABS tablet Take 1 tablet by mouth daily with breakfast.     . polyethylene glycol (MIRALAX / GLYCOLAX) packet Take 17 g by mouth daily. (Patient taking differently: Take 17 g by mouth 2 (two) times daily. ) 14 each 0   No current facility-administered medications on file prior to visit.     There are no Patient Instructions on file for this visit. Return in about 6 months (around 04/14/2019).   Kris Hartmann, NP  This note was completed with Sales executive.  Any errors are purely unintentional.

## 2018-10-17 ENCOUNTER — Other Ambulatory Visit: Payer: Self-pay | Admitting: Internal Medicine

## 2018-10-17 DIAGNOSIS — Z1231 Encounter for screening mammogram for malignant neoplasm of breast: Secondary | ICD-10-CM

## 2018-11-08 ENCOUNTER — Ambulatory Visit
Admission: RE | Admit: 2018-11-08 | Discharge: 2018-11-08 | Disposition: A | Discharge status: Home / Self Care | Payer: Medicare Other | Source: Ambulatory Visit | Attending: Internal Medicine | Admitting: Internal Medicine

## 2018-11-08 DIAGNOSIS — Z1231 Encounter for screening mammogram for malignant neoplasm of breast: Secondary | ICD-10-CM | POA: Diagnosis present

## 2019-01-26 ENCOUNTER — Encounter (INDEPENDENT_AMBULATORY_CARE_PROVIDER_SITE_OTHER): Payer: Self-pay

## 2019-02-01 ENCOUNTER — Other Ambulatory Visit (INDEPENDENT_AMBULATORY_CARE_PROVIDER_SITE_OTHER): Payer: Self-pay | Admitting: Nurse Practitioner

## 2019-02-02 ENCOUNTER — Other Ambulatory Visit: Payer: Self-pay

## 2019-02-02 ENCOUNTER — Encounter: Payer: Self-pay | Admitting: Vascular Surgery

## 2019-02-02 ENCOUNTER — Inpatient Hospital Stay: Payer: Medicare Other

## 2019-02-02 ENCOUNTER — Inpatient Hospital Stay
Admission: RE | Admit: 2019-02-02 | Discharge: 2019-02-03 | DRG: 252 | Disposition: A | Discharge status: Home Health | Payer: Medicare Other | Attending: Internal Medicine | Admitting: Internal Medicine

## 2019-02-02 ENCOUNTER — Encounter
Admission: RE | Disposition: A | Discharge status: Home Health | Payer: Self-pay | Source: Home / Self Care | Attending: Internal Medicine

## 2019-02-02 DIAGNOSIS — Y832 Surgical operation with anastomosis, bypass or graft as the cause of abnormal reaction of the patient, or of later complication, without mention of misadventure at the time of the procedure: Secondary | ICD-10-CM | POA: Diagnosis not present

## 2019-02-02 DIAGNOSIS — Z9842 Cataract extraction status, left eye: Secondary | ICD-10-CM | POA: Diagnosis not present

## 2019-02-02 DIAGNOSIS — K219 Gastro-esophageal reflux disease without esophagitis: Secondary | ICD-10-CM | POA: Diagnosis present

## 2019-02-02 DIAGNOSIS — E213 Hyperparathyroidism, unspecified: Secondary | ICD-10-CM | POA: Diagnosis present

## 2019-02-02 DIAGNOSIS — T82868A Thrombosis of vascular prosthetic devices, implants and grafts, initial encounter: Principal | ICD-10-CM | POA: Diagnosis present

## 2019-02-02 DIAGNOSIS — Z7902 Long term (current) use of antithrombotics/antiplatelets: Secondary | ICD-10-CM

## 2019-02-02 DIAGNOSIS — N186 End stage renal disease: Secondary | ICD-10-CM

## 2019-02-02 DIAGNOSIS — Z833 Family history of diabetes mellitus: Secondary | ICD-10-CM

## 2019-02-02 DIAGNOSIS — E1140 Type 2 diabetes mellitus with diabetic neuropathy, unspecified: Secondary | ICD-10-CM | POA: Diagnosis present

## 2019-02-02 DIAGNOSIS — M19012 Primary osteoarthritis, left shoulder: Secondary | ICD-10-CM | POA: Diagnosis present

## 2019-02-02 DIAGNOSIS — E1122 Type 2 diabetes mellitus with diabetic chronic kidney disease: Secondary | ICD-10-CM | POA: Diagnosis present

## 2019-02-02 DIAGNOSIS — Y92230 Patient room in hospital as the place of occurrence of the external cause: Secondary | ICD-10-CM | POA: Diagnosis not present

## 2019-02-02 DIAGNOSIS — I952 Hypotension due to drugs: Secondary | ICD-10-CM | POA: Diagnosis not present

## 2019-02-02 DIAGNOSIS — M19011 Primary osteoarthritis, right shoulder: Secondary | ICD-10-CM | POA: Diagnosis present

## 2019-02-02 DIAGNOSIS — E785 Hyperlipidemia, unspecified: Secondary | ICD-10-CM | POA: Diagnosis present

## 2019-02-02 DIAGNOSIS — Z90710 Acquired absence of both cervix and uterus: Secondary | ICD-10-CM

## 2019-02-02 DIAGNOSIS — Z794 Long term (current) use of insulin: Secondary | ICD-10-CM | POA: Diagnosis not present

## 2019-02-02 DIAGNOSIS — Z841 Family history of disorders of kidney and ureter: Secondary | ICD-10-CM

## 2019-02-02 DIAGNOSIS — T465X5A Adverse effect of other antihypertensive drugs, initial encounter: Secondary | ICD-10-CM | POA: Diagnosis not present

## 2019-02-02 DIAGNOSIS — I5033 Acute on chronic diastolic (congestive) heart failure: Secondary | ICD-10-CM | POA: Diagnosis present

## 2019-02-02 DIAGNOSIS — I161 Hypertensive emergency: Secondary | ICD-10-CM | POA: Diagnosis present

## 2019-02-02 DIAGNOSIS — N2581 Secondary hyperparathyroidism of renal origin: Secondary | ICD-10-CM | POA: Diagnosis present

## 2019-02-02 DIAGNOSIS — Z9049 Acquired absence of other specified parts of digestive tract: Secondary | ICD-10-CM | POA: Diagnosis not present

## 2019-02-02 DIAGNOSIS — Z992 Dependence on renal dialysis: Secondary | ICD-10-CM

## 2019-02-02 DIAGNOSIS — Z452 Encounter for adjustment and management of vascular access device: Secondary | ICD-10-CM

## 2019-02-02 DIAGNOSIS — I132 Hypertensive heart and chronic kidney disease with heart failure and with stage 5 chronic kidney disease, or end stage renal disease: Secondary | ICD-10-CM | POA: Diagnosis present

## 2019-02-02 DIAGNOSIS — E890 Postprocedural hypothyroidism: Secondary | ICD-10-CM | POA: Diagnosis present

## 2019-02-02 DIAGNOSIS — Z961 Presence of intraocular lens: Secondary | ICD-10-CM | POA: Diagnosis present

## 2019-02-02 DIAGNOSIS — E1151 Type 2 diabetes mellitus with diabetic peripheral angiopathy without gangrene: Secondary | ICD-10-CM | POA: Diagnosis present

## 2019-02-02 DIAGNOSIS — J9600 Acute respiratory failure, unspecified whether with hypoxia or hypercapnia: Secondary | ICD-10-CM

## 2019-02-02 DIAGNOSIS — J9621 Acute and chronic respiratory failure with hypoxia: Secondary | ICD-10-CM | POA: Diagnosis present

## 2019-02-02 DIAGNOSIS — Z79899 Other long term (current) drug therapy: Secondary | ICD-10-CM

## 2019-02-02 DIAGNOSIS — E11319 Type 2 diabetes mellitus with unspecified diabetic retinopathy without macular edema: Secondary | ICD-10-CM | POA: Diagnosis present

## 2019-02-02 DIAGNOSIS — D631 Anemia in chronic kidney disease: Secondary | ICD-10-CM | POA: Diagnosis present

## 2019-02-02 DIAGNOSIS — Z9841 Cataract extraction status, right eye: Secondary | ICD-10-CM

## 2019-02-02 DIAGNOSIS — Z79890 Hormone replacement therapy (postmenopausal): Secondary | ICD-10-CM

## 2019-02-02 DIAGNOSIS — J811 Chronic pulmonary edema: Secondary | ICD-10-CM | POA: Diagnosis present

## 2019-02-02 HISTORY — DX: Unspecified diastolic (congestive) heart failure: I50.30

## 2019-02-02 HISTORY — DX: End stage renal disease: N18.6

## 2019-02-02 HISTORY — PX: A/V FISTULAGRAM: CATH118298

## 2019-02-02 LAB — CBC WITH DIFFERENTIAL/PLATELET
Abs Immature Granulocytes: 0.1 10*3/uL — ABNORMAL HIGH (ref 0.00–0.07)
Basophils Absolute: 0.1 10*3/uL (ref 0.0–0.1)
Basophils Relative: 0 %
Eosinophils Absolute: 0 10*3/uL (ref 0.0–0.5)
Eosinophils Relative: 0 %
HCT: 41.7 % (ref 36.0–46.0)
Hemoglobin: 13.3 g/dL (ref 12.0–15.0)
Immature Granulocytes: 1 %
Lymphocytes Relative: 8 %
Lymphs Abs: 1.2 10*3/uL (ref 0.7–4.0)
MCH: 33 pg (ref 26.0–34.0)
MCHC: 31.9 g/dL (ref 30.0–36.0)
MCV: 103.5 fL — ABNORMAL HIGH (ref 80.0–100.0)
Monocytes Absolute: 1.1 10*3/uL — ABNORMAL HIGH (ref 0.1–1.0)
Monocytes Relative: 8 %
Neutro Abs: 11.8 10*3/uL — ABNORMAL HIGH (ref 1.7–7.7)
Neutrophils Relative %: 83 %
Platelets: 156 10*3/uL (ref 150–400)
RBC: 4.03 MIL/uL (ref 3.87–5.11)
RDW: 12.8 % (ref 11.5–15.5)
WBC: 14.2 10*3/uL — ABNORMAL HIGH (ref 4.0–10.5)
nRBC: 0 % (ref 0.0–0.2)

## 2019-02-02 LAB — GLUCOSE, CAPILLARY
Glucose-Capillary: 83 mg/dL (ref 70–99)
Glucose-Capillary: 107 mg/dL — ABNORMAL HIGH (ref 70–99)
Glucose-Capillary: 160 mg/dL — ABNORMAL HIGH (ref 70–99)
Glucose-Capillary: 137 mg/dL — ABNORMAL HIGH (ref 70–99)
Glucose-Capillary: 178 mg/dL — ABNORMAL HIGH (ref 70–99)
Glucose-Capillary: 296 mg/dL — ABNORMAL HIGH (ref 70–99)

## 2019-02-02 LAB — POTASSIUM (ARMC VASCULAR LAB ONLY): Potassium (ARMC vascular lab): 5.4 — ABNORMAL HIGH (ref 3.5–5.1)

## 2019-02-02 SURGERY — A/V FISTULAGRAM
Anesthesia: Moderate Sedation | Laterality: Left

## 2019-02-02 MED ORDER — INSULIN GLARGINE 100 UNIT/ML ~~LOC~~ SOLN
10.0000 [IU] | Freq: Every day | SUBCUTANEOUS | Status: DC
  Filled 2019-02-02: qty 0.1

## 2019-02-02 MED ORDER — FAMOTIDINE 20 MG PO TABS: 40.0000 mg | ORAL_TABLET | Freq: Once | ORAL | Status: DC | PRN

## 2019-02-02 MED ORDER — PROMETHAZINE HCL 25 MG/ML IJ SOLN
6.2500 mg | Freq: Once | INTRAMUSCULAR | Status: AC
  Administered 2019-02-02: 15:00:00 6.25 mg via INTRAVENOUS

## 2019-02-02 MED ORDER — MIDAZOLAM HCL 2 MG/ML PO SYRP: 8.0000 mg | ORAL_SOLUTION | Freq: Once | ORAL | Status: DC | PRN

## 2019-02-02 MED ORDER — MIDAZOLAM HCL 2 MG/2ML IJ SOLN
INTRAMUSCULAR | Status: DC | PRN
  Administered 2019-02-02 (×2): 1 mg via INTRAVENOUS

## 2019-02-02 MED ORDER — HEPARIN SODIUM (PORCINE) 1000 UNIT/ML DIALYSIS
1000.0000 [IU] | INTRAMUSCULAR | Status: DC | PRN
  Filled 2019-02-02: qty 1

## 2019-02-02 MED ORDER — CLINDAMYCIN PHOSPHATE 300 MG/50ML IV SOLN
INTRAVENOUS | Status: AC
  Administered 2019-02-02: 09:00:00
  Filled 2019-02-02: qty 50

## 2019-02-02 MED ORDER — IOHEXOL 300 MG/ML  SOLN
INTRAMUSCULAR | Status: DC | PRN
  Administered 2019-02-02: 50 mL via INTRAVENOUS

## 2019-02-02 MED ORDER — FENTANYL CITRATE (PF) 100 MCG/2ML IJ SOLN
INTRAMUSCULAR | Status: DC | PRN
  Administered 2019-02-02 (×4): 25 ug via INTRAVENOUS

## 2019-02-02 MED ORDER — ACETAMINOPHEN 325 MG PO TABS
650.0000 mg | ORAL_TABLET | Freq: Four times a day (QID) | ORAL | Status: DC | PRN
  Administered 2019-02-03 (×2): 650 mg via ORAL
  Filled 2019-02-02 (×2): qty 2

## 2019-02-02 MED ORDER — CHLORHEXIDINE GLUCONATE CLOTH 2 % EX PADS: 6.0000 | MEDICATED_PAD | Freq: Every day | CUTANEOUS | Status: DC

## 2019-02-02 MED ORDER — LABETALOL HCL 5 MG/ML IV SOLN
INTRAVENOUS | Status: AC
  Administered 2019-02-02: 20 mg via INTRAVENOUS
  Filled 2019-02-02: qty 4

## 2019-02-02 MED ORDER — FUROSEMIDE 10 MG/ML IJ SOLN
INTRAMUSCULAR | Status: AC
  Filled 2019-02-02: qty 8

## 2019-02-02 MED ORDER — ONDANSETRON HCL 4 MG/2ML IJ SOLN
4.0000 mg | Freq: Four times a day (QID) | INTRAMUSCULAR | Status: DC | PRN
  Filled 2019-02-02: qty 2

## 2019-02-02 MED ORDER — INSULIN ASPART 100 UNIT/ML ~~LOC~~ SOLN
0.0000 [IU] | SUBCUTANEOUS | Status: DC
  Administered 2019-02-02: 01:00:00 1 [IU] via SUBCUTANEOUS
  Administered 2019-02-02: 5 [IU] via SUBCUTANEOUS
  Filled 2019-02-02: qty 1

## 2019-02-02 MED ORDER — MORPHINE SULFATE (PF) 2 MG/ML IV SOLN: 1.0000 mg | Freq: Once | INTRAVENOUS | Status: DC

## 2019-02-02 MED ORDER — FUROSEMIDE 10 MG/ML IJ SOLN
80.0000 mg | Freq: Once | INTRAMUSCULAR | Status: AC
  Administered 2019-02-02: 80 mg via INTRAVENOUS

## 2019-02-02 MED ORDER — HEPARIN SODIUM (PORCINE) 5000 UNIT/ML IJ SOLN
5000.0000 [IU] | Freq: Three times a day (TID) | INTRAMUSCULAR | Status: DC
  Administered 2019-02-02 – 2019-02-03 (×3): 5000 [IU] via SUBCUTANEOUS
  Filled 2019-02-02 (×3): qty 1

## 2019-02-02 MED ORDER — INSULIN ASPART 100 UNIT/ML ~~LOC~~ SOLN: 0.0000 [IU] | Freq: Every day | SUBCUTANEOUS | Status: DC

## 2019-02-02 MED ORDER — HEPARIN SODIUM (PORCINE) 1000 UNIT/ML IJ SOLN
INTRAMUSCULAR | Status: AC
  Filled 2019-02-02: qty 1

## 2019-02-02 MED ORDER — ONDANSETRON HCL 4 MG/2ML IJ SOLN: 4.0000 mg | Freq: Four times a day (QID) | INTRAMUSCULAR | Status: DC | PRN

## 2019-02-02 MED ORDER — FENTANYL CITRATE (PF) 100 MCG/2ML IJ SOLN
INTRAMUSCULAR | Status: AC
  Filled 2019-02-02: qty 2

## 2019-02-02 MED ORDER — HYDRALAZINE HCL 20 MG/ML IJ SOLN
10.0000 mg | Freq: Once | INTRAMUSCULAR | Status: AC
  Administered 2019-02-02: 10:00:00 10 mg via INTRAVENOUS

## 2019-02-02 MED ORDER — HYDRALAZINE HCL 20 MG/ML IJ SOLN
INTRAMUSCULAR | Status: AC
  Administered 2019-02-02: 10 mg via INTRAVENOUS
  Filled 2019-02-02: qty 1

## 2019-02-02 MED ORDER — ACETAMINOPHEN 650 MG RE SUPP: 650.0000 mg | Freq: Four times a day (QID) | RECTAL | Status: DC | PRN

## 2019-02-02 MED ORDER — SODIUM CHLORIDE 0.9 % IV SOLN: 100.0000 mL | INTRAVENOUS | Status: DC | PRN

## 2019-02-02 MED ORDER — NOREPINEPHRINE 4 MG/250ML-% IV SOLN
0.0000 ug/min | INTRAVENOUS | Status: DC
  Administered 2019-02-02: 12:00:00 6 ug/min via INTRAVENOUS
  Filled 2019-02-02: qty 250

## 2019-02-02 MED ORDER — DIPHENHYDRAMINE HCL 50 MG/ML IJ SOLN: 50.0000 mg | Freq: Once | INTRAMUSCULAR | Status: DC | PRN

## 2019-02-02 MED ORDER — ONDANSETRON HCL 4 MG PO TABS: 4.0000 mg | ORAL_TABLET | Freq: Four times a day (QID) | ORAL | Status: DC | PRN

## 2019-02-02 MED ORDER — HYDROMORPHONE HCL 1 MG/ML IJ SOLN
1.0000 mg | Freq: Once | INTRAMUSCULAR | Status: AC | PRN
  Administered 2019-02-02: 1 mg via INTRAVENOUS
  Filled 2019-02-02: qty 1

## 2019-02-02 MED ORDER — HEPARIN (PORCINE) IN NACL 1000-0.9 UT/500ML-% IV SOLN
INTRAVENOUS | Status: AC
  Filled 2019-02-02: qty 500

## 2019-02-02 MED ORDER — ALTEPLASE 2 MG IJ SOLR
INTRAMUSCULAR | Status: AC
  Filled 2019-02-02: qty 4

## 2019-02-02 MED ORDER — HEPARIN SODIUM (PORCINE) 1000 UNIT/ML IJ SOLN
INTRAMUSCULAR | Status: DC | PRN
  Administered 2019-02-02: 4000 [IU] via INTRAVENOUS

## 2019-02-02 MED ORDER — NICARDIPINE HCL IN NACL 20-0.86 MG/200ML-% IV SOLN
3.0000 mg/h | INTRAVENOUS | Status: DC
  Filled 2019-02-02: qty 200

## 2019-02-02 MED ORDER — ALTEPLASE 2 MG IJ SOLR
INTRAMUSCULAR | Status: DC | PRN
  Administered 2019-02-02: 4 mg

## 2019-02-02 MED ORDER — METHYLPREDNISOLONE SODIUM SUCC 125 MG IJ SOLR: 125.0000 mg | Freq: Once | INTRAMUSCULAR | Status: DC | PRN

## 2019-02-02 MED ORDER — INSULIN GLARGINE 100 UNIT/ML ~~LOC~~ SOLN
10.0000 [IU] | Freq: Every day | SUBCUTANEOUS | Status: DC
  Administered 2019-02-02: 10 [IU] via SUBCUTANEOUS
  Filled 2019-02-02 (×2): qty 0.1

## 2019-02-02 MED ORDER — SODIUM CHLORIDE 0.9 % IV SOLN
INTRAVENOUS | Status: DC
  Administered 2019-02-02: 08:00:00 via INTRAVENOUS

## 2019-02-02 MED ORDER — LABETALOL HCL 5 MG/ML IV SOLN
20.0000 mg | Freq: Once | INTRAVENOUS | Status: AC
  Administered 2019-02-02: 11:00:00 20 mg via INTRAVENOUS

## 2019-02-02 MED ORDER — MIDAZOLAM HCL 2 MG/2ML IJ SOLN
INTRAMUSCULAR | Status: AC
  Filled 2019-02-02: qty 4

## 2019-02-02 MED ORDER — INSULIN ASPART 100 UNIT/ML ~~LOC~~ SOLN: 0.0000 [IU] | Freq: Three times a day (TID) | SUBCUTANEOUS | Status: DC

## 2019-02-02 MED ORDER — CLINDAMYCIN PHOSPHATE 300 MG/50ML IV SOLN: 300.0000 mg | Freq: Once | INTRAVENOUS | Status: DC

## 2019-02-02 MED ORDER — LIDOCAINE HCL (PF) 2 % IJ SOLN
5.0000 mL | Freq: Once | INTRAMUSCULAR | Status: DC
  Filled 2019-02-02: qty 5

## 2019-02-02 SURGICAL SUPPLY — 22 items
BALLN DORADO 8X100X80 (BALLOONS) ×3
BALLN LUTONIX 7X220X130 (BALLOONS) ×3
BALLOON DORADO 8X100X80 (BALLOONS) IMPLANT
BALLOON LUTONIX 7X220X130 (BALLOONS) IMPLANT
CANNULA 5F STIFF (CANNULA) ×2 IMPLANT
CATH BEACON 5 .035 40 KMP TP (CATHETERS) IMPLANT
CATH BEACON 5 .038 40 KMP TP (CATHETERS) ×2
DEVICE PRESTO INFLATION (MISCELLANEOUS) ×2 IMPLANT
DRAPE BRACHIAL (DRAPES) ×2 IMPLANT
GAUZE SPONGE 4X4 12PLY STRL (GAUZE/BANDAGES/DRESSINGS) ×2 IMPLANT
GLIDEWIRE ADV .035X180CM (WIRE) ×2 IMPLANT
PACK ANGIOGRAPHY (CUSTOM PROCEDURE TRAY) ×3 IMPLANT
SET AVX THROMB ULT (MISCELLANEOUS) ×2 IMPLANT
SHEATH BRITE TIP 6FRX5.5 (SHEATH) ×2 IMPLANT
SHEATH BRITE TIP 7FRX5.5 (SHEATH) ×2 IMPLANT
STENT VIABAHN 8X150X120 (Permanent Stent) ×2 IMPLANT
STENT VIABAHN 8X15X120 7FR (Permanent Stent) IMPLANT
STENT VIABAHN 8X50X120 (Permanent Stent) ×2 IMPLANT
STENT VIABAHN5X120X8X (Permanent Stent) ×1 IMPLANT
SUT MNCRL AB 4-0 PS2 18 (SUTURE) ×2 IMPLANT
TOWEL OR 17X26 4PK STRL BLUE (TOWEL DISPOSABLE) ×2 IMPLANT
WIRE G 018X200 V18 (WIRE) ×2 IMPLANT

## 2019-02-02 NOTE — Progress Notes (Signed)
HD Tx End  Removed only 516mL d/t pt nausea/vomiting and BP drop - unable to reach UF goal of  2 liters.   Tx ended early d/t pt nausea/vomiting. BP dropping w levo and UF on. UF off w levo running and BP spikes into 180/100s. Levo off, UF off, tx end, normalized pressures and nausea subsided somewhat. Needles pulled and N/V continued. Stable upon RN leaving room with pressure 120/80s. MD aware of all. Given antiemetics (zofran did not work, promethazine was successful in preventing further nausea/vom).   Family aware (called brother, Fatima Sanger) that pt will be staying the night.     02/02/19 1415  Vital Signs  Pulse Rate 93  Resp (!) 30  BP 132/78  Oxygen Therapy  SpO2 96 %  During Hemodialysis Assessment  Blood Flow Rate (mL/min) 200 mL/min  Arterial Pressure (mmHg) -960 mmHg  Venous Pressure (mmHg) 100 mmHg  Transmembrane Pressure (mmHg) 40 mmHg  Ultrafiltration Rate (mL/min) 0 mL/min  Dialysate Flow Rate (mL/min) 800 ml/min  Conductivity: Machine  14  HD Safety Checks Performed Yes  Dialysis Fluid Bolus Normal Saline  Bolus Amount (mL) 250 mL  Intra-Hemodialysis Comments Tx completed (pt vomiting, rinsed back early,MD notified)

## 2019-02-02 NOTE — Progress Notes (Signed)
Pt. Received back from Vascular lab; O2 sat 74%- placed on NRB with sats. Up to 92%. Rales throughout ant. And post. MD called for orders. Pt. Given Hydralazine 10 mg IV for High BP. Ordered Lasix 80 mg IVP now. Pt. States "struggling" for breath; clammy feeling to touch.

## 2019-02-02 NOTE — Progress Notes (Signed)
Pt. tx to ICU now via stretcher, on monitor and BIPAP. Stable for tx with staff assist x 3.  Called & spoke with Pt. Brother Lakesa Coste to inform of tx to ICU.

## 2019-02-02 NOTE — OR Nursing (Signed)
Multiple sized cuffs tried right wrist

## 2019-02-02 NOTE — Consult Note (Signed)
Name: Kendra [name] MRN: [id number] DOB: April 26, 1951    ADMISSION DATE:  02/02/2019 CONSULTATION DATE: 02/02/2019  REFERRING MD : Dr. Lucky Cowboy  CHIEF COMPLAINT: Shortness of Breath   BRIEF PATIENT DESCRIPTION:  68 yo female admitted 04/16 with thrombosed left brachiocephalic fistula s/p left arm fistulagram and central venogram with catheter directed thrombolysis with TPA developed pulmonary edema post procedure requiring Bipap   SIGNIFICANT EVENTS/STUDIES:  04/16-Pt admitted to ICU on Bipap   HISTORY OF PRESENT ILLNESS:   This is a 69 yo female with a PMH of HTN, PVD, Osteoarthritis, Insulin Dependent Diabetes Mellitus, GERD, Hyperparathyroidism, GERD, Diabetic Retinopathy/Neuropathy, ESRD on HD and Anemia of Chronic Renal Failure.  She presented to [facility] on 04/16 for a left arm fistulagram and central venogram with catheter directed thrombolysis with TPA.  Post procedure pt developed severe hypoxic respiratory failure secondary to pulmonary edema and hypertension bp 233/125.  Therefore, pt placed on Bipap and received iv furosemide, iv hydralazine, and iv labetalol.  She was subsequently admitted to ICU for additional workup and treatment.  PAST MEDICAL HISTORY :   has a past medical history of Anemia of chronic renal failure, Diabetic neuropathy ([place]), Diabetic retinopathy ([place]), GERD (gastroesophageal reflux disease), Hyperparathyroidism, secondary renal ([place]), Hypertension, Hypothyroidism, Insulin dependent diabetes mellitus ([place]), Neuropathy, Osteoarthritis, Peripheral vascular disease ([place]), Primary hypertension, and Shingles outbreak (04/2017).  has a past surgical history that includes Abdominal hysterectomy; Cholecystectomy; Thyroidectomy; Cardiac catheterization (N/A, 01/22/2016); Bascilic vein transposition (Left, 03/04/2016); Cardiac catheterization (Left, 05/14/2016); Cardiac catheterization (N/A, 05/14/2016); Cardiac catheterization (N/A, 06/25/2016); Cardiac catheterization (Left,  08/03/2016); Cardiac catheterization (N/A, 08/03/2016); Cardiac catheterization (Left, 08/07/2016); Cardiac catheterization (Left, 09/22/2016); A/V Fistulagram (Left, 08/19/2017); Cataract extraction w/PHACO (Right, 10/07/2017); Cataract extraction w/PHACO (Left, 11/04/2017); A/V Fistulagram (N/A, 03/10/2018); Metatarsal head excision (Right, 04/14/2018); Arthrodesis metatarsalphalangeal joint (mtpj) (Right, 04/14/2018); and A/V Fistulagram (Left, 08/24/2018). Prior to Admission medications   Medication Sig Start Date End Date Taking? Authorizing Provider  acetaminophen (TYLENOL) 500 MG tablet Take 500 mg by mouth every 8 (eight) hours as needed for mild pain or moderate pain.   Yes [provider]  atorvastatin (LIPITOR) 40 MG tablet Take 40 mg by mouth at bedtime.   Yes [provider]  calcium acetate (PHOSLO) 667 MG capsule Take 1,334 mg by mouth 3 (three) times daily with meals.    Yes [provider]  carvedilol (COREG) 6.25 MG tablet Take 12.5 mg by mouth at bedtime.  07/19/17  Yes [provider]  docusate sodium (COLACE) 50 MG capsule Take 200 mg by mouth 2 (two) times daily.    Yes [provider]  enalapril (VASOTEC) 20 MG tablet Take 20 mg by mouth at bedtime.  07/28/17  Yes [provider]  furosemide (LASIX) 80 MG tablet Take 80 mg by mouth every Tuesday, Thursday, Saturday, and Sunday. In the morning   Yes [provider]  Insulin Glargine (LANTUS SOLOSTAR) 100 UNIT/ML Solostar Pen Inject 20 Units into the skin daily at 10 pm.   Yes [provider]  levothyroxine (SYNTHROID, LEVOTHROID) 175 MCG tablet Take 175 mcg by mouth daily before breakfast. 1 hour before breakfast 07/20/17  Yes [provider]  lidocaine-prilocaine (EMLA) cream Apply 1 application topically as needed (port access).   Yes [provider]  polyethylene glycol (MIRALAX / GLYCOLAX) packet Take 17 g by mouth daily. Patient taking  differently: Take 17 g by mouth 2 (two) times daily.  05/08/17  Yes Gladstone Lighter, MD  B Complex-C-Folic Acid (RENA-VITE  RX PO) Take by mouth.    [provider]  clopidogrel (PLAVIX) 75 MG tablet Take 75 mg by mouth daily with breakfast.  09/08/16   [provider]  Dulaglutide (TRULICITY) 4.12 IN/8.6VE SOPN Inject 0.75 mg into the skin once a week. Takes on saturdays    [provider]  fosinopril (MONOPRIL) 40 MG tablet  05/25/18   [provider]  HYDROcodone-acetaminophen (NORCO) 7.5-325 MG tablet Take 1 tablet by mouth every 6 (six) hours as needed for moderate pain. 04/14/18   Troxler, Rodman Key, DPM  insulin detemir (LEVEMIR) 100 UNIT/ML injection Inject 0.16 mLs (16 Units total) into the skin at bedtime. 05/07/17   Gladstone Lighter, MD  Insulin Pen Needle (NOVOFINE PLUS) 32G X 4 MM MISC Use 1 Needle free injection nightly 06/23/18   [provider]  multivitamin (RENA-VIT) TABS tablet Take 1 tablet by mouth daily with breakfast.     [provider]   No Known Allergies  FAMILY HISTORY:  family history includes Diabetes Mellitus II in her brother, father, and mother; Renal Disease in her brother. SOCIAL HISTORY:  reports that she has never smoked. She has never used smokeless tobacco. She reports that she does not drink alcohol or use drugs.  REVIEW OF SYSTEMS: Positives in BOLD   Constitutional: Negative for fever, chills, weight loss, malaise/fatigue and diaphoresis.  HENT: Negative for hearing loss, ear pain, nosebleeds, congestion, sore throat, neck pain, tinnitus and ear discharge.   Eyes: Negative for blurred vision, double vision, photophobia, pain, discharge and redness.  Respiratory: cough, hemoptysis, sputum production, shortness of breath, wheezing and stridor.   Cardiovascular: Negative for chest pain, palpitations, orthopnea, claudication, leg swelling and PND.  Gastrointestinal: Negative for heartburn, nausea, vomiting,  abdominal pain, diarrhea, constipation, blood in stool and melena.  Genitourinary: Negative for dysuria, urgency, frequency, hematuria and flank pain.  Musculoskeletal: Negative for myalgias, back pain, joint pain and falls.  Skin: Negative for itching and rash.  Neurological: Negative for dizziness, tingling, tremors, sensory change, speech change, focal weakness, seizures, loss of consciousness, weakness and headaches.  Endo/Heme/Allergies: Negative for environmental allergies and polydipsia. Does not bruise/bleed easily.  SUBJECTIVE:  Pt c/o shortness of breath   VITAL SIGNS: Temp:  [97.6 F (36.4 C)-98.1 F (36.7 C)] 97.6 F (36.4 C) (04/16 1105) Pulse Rate:  [73-115] 82 (04/16 1105) Resp:  [16-46] 46 (04/16 1105) BP: (99-256)/(54-162) 99/54 (04/16 1105) SpO2:  [80 %-100 %] 95 % (04/16 1105) FiO2 (%):  [50 %] 50 % (04/16 1105) Weight:  [99.2 kg-99.8 kg] 99.2 kg (04/16 1105)  PHYSICAL EXAMINATION: General: acutely ill appearing female, in respiratory distress on Bipap  Neuro: alert and oriented, follows commands  HEENT: JVD present Cardiovascular: nsr, rrr, no R/G  Lungs: diffuse crackles throughout, labored, tachypneic  Abdomen: +BS x4, soft, non tender, non distended Musculoskeletal: normal bulk and tone, no edema  Skin: intact no rashes or lesions present, left upper arm fistula   No results for input(s): NA, K, CL, CO2, BUN, CREATININE, GLUCOSE in the last 168 hours. No results for input(s): HGB, HCT, WBC, PLT in the last 168 hours. No results found.  ASSESSMENT / PLAN:  Acute on chronic hypoxic respiratory failure secondary to pulmonary edema Prn Bipap for dyspnea and/or hypoxia  Repeat CXR in am   Hypertensive emergency-resolved  Hypotension secondary to antihypertensive medications  Continuous telemetry monitoring  Prn levophed gt to maintain map >65 Hold antihypertensives for now   ESRD on hemodialysis Trend BMP Replace electrolytes  as indicated  Monitor  [facility] consulted appreciate input-plan for emergent hemodialysis   Marda Stalker, [place] Pager [phone] (please enter 7 digits) PCCM Consult Pager [phone] (please enter 7 digits)

## 2019-02-02 NOTE — Progress Notes (Signed)
Family Meeting Note  Advance Directive:no  Today a meeting took place with the Patient.  Patient is able to participate.  The following clinical team members were present during this meeting:MD  The following were discussed:Patient's diagnosis: acute on chronic hypoxic respiratory failure, Patient's progosis: Unable to determine and Goals for treatment: Full Code  Additional follow-up to be provided: prn  Time spent during discussion:20 minutes  Evette Doffing, MD

## 2019-02-02 NOTE — Progress Notes (Signed)
[facility]  ROUNDING NOTE   Subjective:  Patient well-known to [place] as we follow her for outpatient hemodialysis. She presented today for a thrombectomy of left upper extremity AV access. Shortly after procedure was completed she became increasingly short of breath. Contrast administered was 50 cc. She was transferred to the critical care unit urgently. Patient started on dialysis under our instruction.   Objective:  Vital signs in last 24 hours:  Temp:  [97.6 F (36.4 C)-98.1 F (36.7 C)] 98 F (36.7 C) (04/16 1430) Pulse Rate:  [73-115] 95 (04/16 1430) Resp:  [16-46] 22 (04/16 1430) BP: (99-256)/(54-162) 137/79 (04/16 1430) SpO2:  [80 %-100 %] 97 % (04/16 1430) FiO2 (%):  [50 %] 50 % (04/16 1105) Weight:  [98.8 kg-99.8 kg] 98.8 kg (04/16 1430)  Weight change:  Filed Weights   02/02/19 1105 02/02/19 1130 02/02/19 1430  Weight: 99.2 kg 99.2 kg 98.8 kg    Intake/Output: No intake/output data recorded.   Intake/Output this shift:  Total I/O In: -  Out: 500 [Other:500]  Physical Exam: General: Critically ill-appearing  Head: Normocephalic, atraumatic. Moist oral mucosal membranes  Eyes: Anicteric  Neck: Supple, trachea midline  Lungs:  Diffuse bilateral rales with increased work of breathing on BiPAP  Heart: S1S2 no rubs  Abdomen:  Soft, nontender, bowel sounds present  Extremities: Trace peripheral edema.  Neurologic: Awake, alert, following commands  Skin: No lesions  Access: LUE AVF    Basic Metabolic Panel: No results for input(s): NA, K, CL, CO2, GLUCOSE, BUN, CREATININE, CALCIUM, MG, PHOS in the last 168 hours.  Liver Function Tests: No results for input(s): AST, ALT, ALKPHOS, BILITOT, PROT, ALBUMIN in the last 168 hours. No results for input(s): LIPASE, AMYLASE in the last 168 hours. No results for input(s): AMMONIA in the last 168 hours.  CBC: No results for input(s): WBC, NEUTROABS, HGB, HCT, MCV, PLT in the last 168 hours.  Cardiac  Enzymes: No results for input(s): CKTOTAL, CKMB, CKMBINDEX, TROPONINI in the last 168 hours.  BNP: Invalid input(s): POCBNP  CBG: Recent Labs  Lab 02/02/19 0810 02/02/19 1021 02/02/19 1053  GLUCAP 83 107* 160*    Microbiology: Results for orders placed or performed during the hospital encounter of 05/04/17  Urine culture     Status: Abnormal   Collection Time: 05/02/17  4:31 AM  Result Value Ref Range Status   Specimen Description URINE, RANDOM  Final   Special Requests NONE  Final   Culture MULTIPLE SPECIES PRESENT, SUGGEST RECOLLECTION (A)  Final   Report Status 05/05/2017 FINAL  Final  MRSA PCR Screening     Status: None   Collection Time: 05/04/17  8:44 AM  Result Value Ref Range Status   MRSA by PCR NEGATIVE NEGATIVE Final    Comment:        The GeneXpert MRSA Assay (FDA approved for NASAL specimens only), is one component of a comprehensive MRSA colonization surveillance program. It is not intended to diagnose MRSA infection nor to guide or monitor treatment for MRSA infections.     Coagulation Studies: No results for input(s): LABPROT, INR in the last 72 hours.  Urinalysis: No results for input(s): COLORURINE, LABSPEC, PHURINE, GLUCOSEU, HGBUR, BILIRUBINUR, KETONESUR, PROTEINUR, UROBILINOGEN, NITRITE, LEUKOCYTESUR in the last 72 hours.  Invalid input(s): APPERANCEUR    Imaging: Dg Chest Port 1 View  Result Date: 02/02/2019 CLINICAL DATA:  68 year old female with respiratory distress and hypertension EXAM: PORTABLE CHEST 1 VIEW COMPARISON:  05/02/2017, 05/06/2016 FINDINGS: Cardiomediastinal silhouette unchanged  in size and contour. Since the prior there has been development of mixed reticulonodular opacities with developing airspace opacity in the right mid lung. No pneumothorax. No large pleural effusion. No displaced fracture. IMPRESSION: Bilateral reticulonodular and airspace opacities worst on the right, concerning for multifocal pneumonia. Asymmetric  edema could also have this appearance. Electronically Signed   By: Corrie Mckusick D.O.   On: 02/02/2019 11:47     Medications:   . sodium chloride    . sodium chloride    . norepinephrine (LEVOPHED) Adult infusion Stopped (02/02/19 1430)   . alteplase      . [START ON 02/03/2019] Chlorhexidine Gluconate Cloth  6 each Topical Q0600  . fentaNYL      . furosemide      . Heparin (Porcine) in NaCl      . heparin      . heparin  5,000 Units Subcutaneous Q8H  . insulin aspart  0-9 Units Subcutaneous Q4H  . insulin glargine  10 Units Subcutaneous QHS  . midazolam      .  morphine injection  1 mg Intravenous Once   sodium chloride, sodium chloride, acetaminophen **OR** acetaminophen, heparin, HYDROmorphone (DILAUDID) injection, ondansetron **OR** ondansetron (ZOFRAN) IV  Assessment/ Plan:  68 y.o. female with past medical history of end-stage renal disease on hemodialysis MWF, anemia chronic kidney disease, secondary hyperparathyroidism, hypertension, hypothyroidism, diabetes mellitus type 2, osteoarthritis, diabetic retinopathy, admitted for thrombectomy of AVF and subsequently developed acute respiratory failure.   [facility] [place]/MWF-2  1.  ESRD on HD MWF.  Patient was in significant respiratory distress with significantly elevated blood pressure.  Therefore we initiated dialysis on an urgent basis.  Reevaluate for dialysis tomorrow as well.  2.  Acute respiratory failure.  Chest x-ray is mentioned that this could potentially be asymmetric pulmonary edema.  However she was found to have rales in both lungs.  Therefore favor pulmonary edema as an etiology.  Patient afebrile as well.  Continue ultrafiltration with dialysis.  3.  Anemia of chronic kidney disease.  Check CBC.  4.  Secondary hyperparathyroidism.  Evaluate bone mineral metabolism parameters over the course of the hospitalization.  5.  Thanks for consultation.      LOS: 0 Leena Tiede 4/16/20203:30 PM

## 2019-02-02 NOTE — Progress Notes (Signed)
MD called again: pt. In severe resp. Distress. IV lasix given. BIPAP placed on now by Resp. Therapist now. Dr. Holley Raring in now at bedside now to see pt. O2 level 87% on NRB before BIPAP on. Dr. Lucky Cowboy paged now and at bedside. Dr. Rosita Fire arrived also now to evaluate pt.

## 2019-02-02 NOTE — Progress Notes (Signed)
Called pt. Dialysis center :[facility] [address] per MD request to have pt. Dialyzed today. Spoke with tech. Mark at dialysis center and he states they will dialyze her today around noon. MD made aware.

## 2019-02-02 NOTE — Progress Notes (Signed)
Pre HD Tx  Pt no longer displays increased work of breathing. Improved O2 sats.   02/02/19 1130  Hand-Off documentation  Report given to (Full Name) Trellis Paganini RN  Report received from (Full Name) Margreta Journey RN  Vital Signs  Temp 97.6 F (36.4 C)  Temp Source Axillary  Pulse Rate 80  Pulse Rate Source Monitor  Resp (!) 36  BP Location Right Arm  BP Method Automatic  Patient Position (if appropriate) Lying  Oxygen Therapy  SpO2 99 %  O2 Device Bi-PAP  Pain Assessment  Pain Scale 0-10  Pain Score 5  Pain Type Other (Comment) (headache d/t BP changes, MD at bedside aware)  Pain Intervention(s) Medication (See eMAR);MD notified (Comment);RN made aware (headache from BP changes, MD aware)  Dialysis Weight  Weight 99.2 kg  Type of Weight Pre-Dialysis  Time-Out for Hemodialysis  What Procedure? Hemodialysis  Pt Identifiers(min of two) First/Last Name;MRN/Account#  Correct Site? Yes  Correct Side? Yes  Correct Procedure? Yes  Consents Verified? Yes  Rad Studies Available? N/A  Safety Precautions Reviewed? Yes  Engineer, civil (consulting) Number 1  Station Number  [id number])  UF/Alarm Test Passed  Conductivity: Meter 14  Conductivity: Machine  14  pH 7.2  Reverse Osmosis WRO1  Normal Saline Lot Number [id number]  Dialyzer Lot Number [id number]  Disposable Set Lot Number [id number]  Machine Temperature 98.6 F (37 C)  Musician and Audible Yes  Blood Lines Intact and Secured Yes  Pre Treatment Patient Checks  Vascular access used during treatment Fistula  Hepatitis B Surface Antigen Results Negative  Date Hepatitis B Surface Antigen Drawn 10/07/18  Hepatitis B Surface Antibody  (>10)  Date Hepatitis B Surface Antibody Drawn 10/07/18  Hemodialysis Consent Verified Yes  Hemodialysis Standing Orders Initiated Yes  ECG (Telemetry) Monitor On Yes  Prime Ordered Normal Saline  Length of  DialysisTreatment -hour(s) 3.5 Hour(s)  Dialysis Treatment Comments Na 140  Dialyzer  Elisio 17H NR  Dialysate 2K, 2.5 Ca  Dialysate Flow Ordered 800  Blood Flow Rate Ordered 400 mL/min  Ultrafiltration Goal 2 Liters  Dialysis Blood Pressure Support Ordered Other (Comment) (levo)  Education / Care Plan  Dialysis Education Provided Yes  Documented Education in Care Plan Yes  Fistula / Graft Left Upper arm Arteriovenous fistula  No Placement Date or Time found.   Placed prior to admission: Yes  Orientation: Left  Access Location: Upper arm  Access Type: Arteriovenous fistula  Site Condition No complications  Fistula / Graft Assessment Thrill;Bruit;Present  Status Accessed  Needle Size 15  Drainage Description None      02/02/19 1130  Hand-Off documentation  Report given to (Full Name) Trellis Paganini RN  Report received from (Full Name) Margreta Journey RN  Vital Signs  Temp 97.6 F (36.4 C)  Temp Source Axillary  Pulse Rate 80  Pulse Rate Source Monitor  Resp (!) 36  BP Location Right Arm  BP Method Automatic  Patient Position (if appropriate) Lying  Oxygen Therapy  SpO2 99 %  O2 Device Bi-PAP  Pain Assessment  Pain Scale 0-10  Pain Score 5  Pain Type Other (Comment) (headache d/t BP changes, MD at bedside aware)  Pain Intervention(s) Medication (See eMAR);MD notified (Comment);RN made aware (headache from BP changes, MD aware)  Dialysis Weight  Weight 99.2 kg  Type of Weight Pre-Dialysis  Time-Out for Hemodialysis  What Procedure? Hemodialysis  Pt Identifiers(min of two) First/Last Name;MRN/Account#  Correct Site? Yes  Correct Side? Yes  Correct  Procedure? Yes  Consents Verified? Yes  Rad Studies Available? N/A  Safety Precautions Reviewed? Yes  Engineer, civil (consulting) Number 1  Station Number  [id number])  UF/Alarm Test Passed  Conductivity: Meter 14  Conductivity: Machine  14  pH 7.2  Reverse Osmosis WRO1  Normal Saline Lot Number [id number]  Dialyzer Lot Number [id number]  Disposable Set Lot Number [id number]  Machine Temperature 98.6 F (37 C)  TEFL teacher and Audible Yes  Blood Lines Intact and Secured Yes  Pre Treatment Patient Checks  Vascular access used during treatment Fistula  Hepatitis B Surface Antigen Results Negative  Date Hepatitis B Surface Antigen Drawn 10/07/18  Hepatitis B Surface Antibody  (>10)  Date Hepatitis B Surface Antibody Drawn 10/07/18  Hemodialysis Consent Verified Yes  Hemodialysis Standing Orders Initiated Yes  ECG (Telemetry) Monitor On Yes  Prime Ordered Normal Saline  Length of  DialysisTreatment -hour(s) 3.5 Hour(s)  Dialysis Treatment Comments Na 140  Dialyzer Elisio 17H NR  Dialysate 2K, 2.5 Ca  Dialysate Flow Ordered 800  Blood Flow Rate Ordered 400 mL/min  Ultrafiltration Goal 2 Liters  Dialysis Blood Pressure Support Ordered Other (Comment) (levo)  Education / Care Plan  Dialysis Education Provided Yes  Documented Education in Care Plan Yes  Fistula / Graft Left Upper arm Arteriovenous fistula  No Placement Date or Time found.   Placed prior to admission: Yes  Orientation: Left  Access Location: Upper arm  Access Type: Arteriovenous fistula  Site Condition No complications  Fistula / Graft Assessment Thrill;Bruit;Present  Status Accessed  Needle Size 15  Drainage Description None

## 2019-02-02 NOTE — Progress Notes (Signed)
Post HD Tx  Removed only 530mL d/t pt nausea/vomiting and BP drop - unable to reach UF goal of  2 liters.   Tx ended early d/t pt nausea/vomiting. BP dropping w levo and UF on. UF off w levo running and BP spikes into 180/100s. Levo off, UF off, tx end, normalized pressures and nausea subsided somewhat. Needles pulled and N/V continued. Stable upon RN leaving room with pressure 120/80s. MD aware of all. Given antiemetics (zofran did not work, promethazine was successful in preventing further nausea/vom).   Family aware (called brother, Fatima Sanger) that pt will be staying the night.     02/02/19 1430  Hand-Off documentation  Report given to (Full Name) Margreta Journey RN ICU  Report received from (Full Name) Trellis Paganini RN  Vital Signs  Temp 98 F (36.7 C)  Temp Source Oral  Pulse Rate 95  Pulse Rate Source Monitor  Resp (!) 22  BP 137/79  BP Location Left Arm  BP Method Automatic  Patient Position (if appropriate) Lying  Oxygen Therapy  SpO2 97 %  O2 Device Nasal Cannula  O2 Flow Rate (L/min) 8 L/min  Pain Assessment  Pain Scale 0-10  Pain Score 0  Dialysis Weight  Weight 98.8 kg  Type of Weight Post-Dialysis  Post-Hemodialysis Assessment  Rinseback Volume (mL) 250 mL  Dialyzer Clearance Lightly streaked  Duration of HD Treatment -hour(s) 2.5 hour(s)  Hemodialysis Intake (mL) 500 mL  UF Total -Machine (mL) 1000 mL  Net UF (mL) 500 mL  Tolerated HD Treatment No (Comment) (nausea, vomiting. early tx end, 1 hour left MD aware.)  AVG/AVF Arterial Site Held (minutes) 10 minutes  AVG/AVF Venous Site Held (minutes) 12 minutes  Fistula / Graft Left Upper arm Arteriovenous fistula  No Placement Date or Time found.   Placed prior to admission: Yes  Orientation: Left  Access Location: Upper arm  Access Type: Arteriovenous fistula  Site Condition No complications  Fistula / Graft Assessment Thrill;Bruit;Present  Status Deaccessed  Drainage Description None

## 2019-02-02 NOTE — Op Note (Signed)
[facility]    OPERATIVE NOTE   PROCEDURE: 1.  Left brachiocephalic arteriovenous fistula cannulation under ultrasound guidance 2.  Left arm fistulagram and central venogram 3.  Catheter directed thrombolysis with 4 mg of TPA delivered with the AngioJet AVX catheter 4.  Mechanical rheolytic thrombectomy to the left brachiocephalic AVF with the AngioJet AVX catheter 5.  Percutaneous transluminal angioplasty of the upper arm cephalic vein and the cephalic vein subclavian vein confluence into the subclavian vein with 2 inflations with a 7 mm diameter by 22 cm length Lutonix drug-coated angioplasty balloon 6.  Viabahn stent placement x2 to the left upper arm cephalic vein with an 8 mm diameter by 15 cm length stent and an 8 mm diameter by 5 cm length stent for residual stenosis and thrombus after above procedures  PRE-OPERATIVE DIAGNOSIS: 1. ESRD 2.  Thrombosed left brachiocephalic arteriovenous fistula  POST-OPERATIVE DIAGNOSIS: same as above   SURGEON: Leotis Pain, MD  ANESTHESIA: local with Moderate Conscious Sedation for 40 minutes using 2 mg of Versed and 100 Mcg of Fentanyl  ESTIMATED BLOOD LOSS: 10 cc  FINDING(S): 1. Thrombosed left brachiocephalic AV fistula  SPECIMEN(S):  None  CONTRAST: 50 cc  FLUORO TIME: 7.2 minutes  INDICATIONS: Patient is a 68 y.o.female who presents with a thrombosed left brachiocephalic arteriovenous fistula.  The patient is scheduled for an attempted declot and shuntogram.  The patient is aware the risks include but are not limited to: bleeding, infection, thrombosis of the cannulated access, and possible anaphylactic reaction to the contrast.  The patient is aware of the risks of the procedure and elects to proceed forward.  DESCRIPTION: After full informed written consent was obtained, the patient was brought back to the angiography suite and placed supine upon the angiography table.  The patient was connected to monitoring  equipment. Moderate conscious sedation was administered during a face to face encounter with the patient throughout the procedure with my supervision of the RN administering medicines and monitoring the patient's vital signs, pulse oximetry, telemetry and mental status throughout from the start of the procedure until the patient was taken to the recovery room. The left arm was prepped and draped in the standard fashion for a percutaneous access intervention.  Under ultrasound guidance, the left brachiocephalic arteriovenous fistula was cannulated with a micropuncture needle under direct ultrasound guidance in an antegrade fashion near the anastomosis, and permanent images were performed.  The microwire was advanced and the needle was exchanged for the a microsheath.  I then upsized to a 6 Fr Sheath and imaging was performed.  Hand injections were completed to image the access including the central venous system. This demonstrated thrombosis of the AVF.  Based on the images, this patient will need extensive treatment to salvage the fistula. I then gave the patient 4000 units of intravenous heparin.  I then placed an Advantage wire into the subclavian vein from the antegrade sheath. 4 mg of TPA were deployed throughout the entirety of the fistula and into the subclavian vein. This was allowed to dwell for 10-15 minutes. Mechanical rheolytic thrombectomy was then performed throughout the fistula and into the subclavian vein. This uncovered multiple areas of stenosis in the upper arm cephalic vein and an area near the cephalic vein subclavian vein confluence.   I then elected to perform angioplasty of the upper arm cephalic vein and the cephalic vein subclavian vein confluence.  A 7 mm diameter by 22 cm length Lutonix drug-coated angioplasty balloon was inflated  twice to encompass nearly the entire upper arm cephalic vein and get into the subclavian vein to cover the more central stenosis.  Both inflations were 10 to  12 atm for 1 minute.  Completion imaging showed the more central lesion to have less than 20% stenosis at the cephalic vein subclavian vein confluence.  In the mid and distal upper arm cephalic vein there remained 2 areas of greater than 80% stenosis as well as significant residual thrombus.. Mechanical rheolytic thrombectomy was performed again with minimal improvement. I then elected to treat this with a covered stent.  I upsized to a 7 [place] sheath.  An 8 mm diameter by 15 cm Viabahn stent was then deployed.  This did not entirely encompass the more proximal portion of the lesion and a second 8 mm diameter Viabahn stent was deployed and this time it was 5 cm in length.  These were postdilated with an 8 mm diameter high-pressure angioplasty balloon with less than 10% residual stenosis.  There is a mild amount of residual mural thrombus in the aneurysmal portion of the fistula but this was markedly improved.  There is now clinically good flow in the AV fistula.    Based on the completion imaging, no further intervention is necessary.  The wire and balloon were removed from the sheath.  A 4-0 Monocryl purse-string suture was sewn around the sheath.  The sheath was removed while tying down the suture.  A sterile bandage was applied to the puncture site.  COMPLICATIONS: None  CONDITION: Stable   Leotis Pain 02/02/2019 10:12 AM   This note was created with Dragon Medical transcription system. Any errors in dictation are purely unintentional.

## 2019-02-02 NOTE — Procedures (Signed)
Central Venous Catheter Insertion Procedure Note Kendra [name] [id number] [birth year]  Procedure: Insertion of Central Venous Catheter Indications: Assessment of intravascular volume, Drug and/or fluid administration and Frequent blood sampling  Procedure Details Consent: Risks of procedure as well as the alternatives and risks of each were explained to the (patient/caregiver).  Consent for procedure obtained. Time Out: Verified patient identification, verified procedure, site/side was marked, verified correct patient position, special equipment/implants available, medications/allergies/relevent history reviewed, required imaging and test results available.  Performed  Maximum sterile technique was used including antiseptics, cap, gloves, gown, hand hygiene, mask and sheet. Skin prep: Chlorhexidine; local anesthetic administered A antimicrobial bonded/coated triple lumen catheter was placed in the right internal jugular vein using the Seldinger technique.  Evaluation Blood flow good Complications: No apparent complications Patient did tolerate procedure well. Chest X-ray ordered to verify placement.  CXR: pending.  Procedure was performed using Ultrasound for direct visualization of cannulization of Right IJ Vein.  Line was advanced and secured at the 17 cm mark.   Kendra [name], AGACNP-BC [name] Pager: [phone] Cell: [place] 02/02/2019, 8:49 PM

## 2019-02-02 NOTE — H&P (Signed)
[facility] History & Physical Update  The patient was interviewed and re-examined.  The patient's previous History and Physical has been reviewed and is unchanged.  There is no change in the plan of care. We plan to proceed with the scheduled procedure.  Leotis Pain, MD  02/02/2019, 8:58 AM

## 2019-02-02 NOTE — Progress Notes (Signed)
Post HD Assessment  Removed only 524mL d/t pt nausea/vomiting and BP drop - unable to reach UF goal of  2 liters.   Tx ended early d/t pt nausea/vomiting. BP dropping w levo and UF on. UF off w levo running and BP spikes into 180/100s. Levo off, UF off, tx end, normalized pressures and nausea subsided somewhat. Needles pulled and N/V continued. Stable upon RN leaving room with pressure 120/80s. MD aware of all. Given antiemetics (zofran did not work, promethazine was successful in preventing further nausea/vom).   Family aware (called brother, Fatima Sanger) that pt will be staying the night.     02/02/19 1529  Neurological  Level of Consciousness Alert  Orientation Level Oriented X4  Respiratory  Bilateral Breath Sounds Expiratory wheezes;Diminished  Cardiac  Cardiac Rhythm Heart block;NSR  Heart Block Type 1st degree AVB  Vascular  R Radial Pulse +2  L Radial Pulse +2  Integumentary  Integumentary (WDL) X  Skin Color Appropriate for ethnicity  Musculoskeletal  Musculoskeletal (WDL) X  Generalized Weakness Yes  GU Assessment  Genitourinary (WDL) X  Genitourinary Symptoms Oliguria  Psychosocial  Psychosocial (WDL) WDL

## 2019-02-02 NOTE — H&P (Addendum)
[facility] NAME: Kendra [name]    MR#:  [id number]  DATE OF BIRTH:  1951-10-07  DATE OF ADMISSION:  02/02/2019  PRIMARY CARE PHYSICIAN: Baxter Hire, MD   REQUESTING/REFERRING PHYSICIAN: Merton Border, MD  CHIEF COMPLAINT:  Shortness of breath  HISTORY OF PRESENT ILLNESS:  Kendra [name]  is a 68 y.o. female with a known history of HTN, PVD, T2DM, ESRD on HD who had an outpatient AV fistula declotting procedure this morning. Immediately after her procedure, she developed hypoxia and hypertension. She was placed on non-rebreather. She continued to be in respiratory distress, so she was placed on BiPAP. IV lasix given. She was transferred to the ICU for further management. She states she is feeling better on BiPAP. She denies any fevers, chills, or cough.  PAST MEDICAL HISTORY:   Past Medical History:  Diagnosis Date  . Anemia of chronic renal failure    DIALYSIS m/w/f  . Diabetic neuropathy ([place])   . Diabetic retinopathy ([place])   . GERD (gastroesophageal reflux disease)   . Hyperparathyroidism, secondary renal ([place])   . Hypertension   . Hypothyroidism   . Insulin dependent diabetes mellitus ([place])   . Neuropathy    feet and fingers  . Osteoarthritis    bilateral shoulders  . Peripheral vascular disease ([place])   . Primary hypertension   . Shingles outbreak 04/2017    PAST SURGICAL HISTORY:   Past Surgical History:  Procedure Laterality Date  . A/V FISTULAGRAM Left 08/19/2017   Procedure: A/V Fistulagram;  Surgeon: Algernon Huxley, MD;  Location: [facility];  Service: Cardiovascular;  Laterality: Left;  . A/V FISTULAGRAM N/A 03/10/2018   Procedure: A/V FISTULAGRAM;  Surgeon: Algernon Huxley, MD;  Location: [facility];  Service: Cardiovascular;  Laterality: N/A;  . A/V FISTULAGRAM Left 08/24/2018   Procedure: A/V FISTULAGRAM;  Surgeon: Algernon Huxley, MD;  Location: [facility];  Service:  Cardiovascular;  Laterality: Left;  . ABDOMINAL HYSTERECTOMY    . ARTHRODESIS METATARSALPHALANGEAL JOINT (MTPJ) Right 04/14/2018   Procedure: ARTHRODESIS; HALLUX/IP JOINT;  Surgeon: Albertine Patricia, DPM;  Location: [facility];  Service: Podiatry;  Laterality: Right;  . BASCILIC VEIN TRANSPOSITION Left 03/04/2016   Procedure: BASCILIC VEIN TRANSPOSITION ( STAGE 1 );  Surgeon: Algernon Huxley, MD;  Location: [facility];  Service: Vascular;  Laterality: Left;  . CATARACT EXTRACTION W/PHACO Right 10/07/2017   Procedure: CATARACT EXTRACTION PHACO AND INTRAOCULAR LENS PLACEMENT (IOC)-RIGHT DIABETIC;  Surgeon: Eulogio Bear, MD;  Location: [facility];  Service: Ophthalmology;  Laterality: Right;  [place] 00:30.8 AP% 8.9 CDE 2.75 FLUID PACK LOT # [id number]  . CATARACT EXTRACTION W/PHACO Left 11/04/2017   Procedure: CATARACT EXTRACTION PHACO AND INTRAOCULAR LENS PLACEMENT (IOC);  Surgeon: Eulogio Bear, MD;  Location: [facility];  Service: Ophthalmology;  Laterality: Left;  [place] 00:30.0 AP% 8.0 CDE 2.41 Fluid Pack lot # [id number]  . CHOLECYSTECTOMY    . METATARSAL HEAD EXCISION Right 04/14/2018   Procedure: METATARSAL HEAD EXCISION-1ST, 2ND & 5TH MET HEADS;  Surgeon: Albertine Patricia, DPM;  Location: [facility];  Service: Podiatry;  Laterality: Right;  . PERIPHERAL VASCULAR CATHETERIZATION N/A 01/22/2016   Procedure: Dialysis/Perma Catheter Insertion;  Surgeon: Katha Cabal, MD;  Location: [facility];  Service: Cardiovascular;  Laterality: N/A;  . PERIPHERAL VASCULAR CATHETERIZATION Left 05/14/2016   Procedure: A/V Shuntogram/Fistulagram;  Surgeon: Algernon Huxley, MD;  Location: [facility];  Service: Cardiovascular;  Laterality: Left;  . PERIPHERAL VASCULAR CATHETERIZATION N/A 05/14/2016   Procedure: A/V Shunt Intervention;  Surgeon: Algernon Huxley, MD;  Location: [facility];  Service: Cardiovascular;  Laterality: N/A;  . PERIPHERAL VASCULAR CATHETERIZATION N/A 06/25/2016   Procedure:  Dialysis/Perma Catheter Removal;  Surgeon: Algernon Huxley, MD;  Location: [facility];  Service: Cardiovascular;  Laterality: N/A;  . PERIPHERAL VASCULAR CATHETERIZATION Left 08/03/2016   Procedure: A/V Shuntogram/Fistulagram;  Surgeon: Algernon Huxley, MD;  Location: [facility];  Service: Cardiovascular;  Laterality: Left;  . PERIPHERAL VASCULAR CATHETERIZATION N/A 08/03/2016   Procedure: A/V Shunt Intervention;  Surgeon: Algernon Huxley, MD;  Location: [facility];  Service: Cardiovascular;  Laterality: N/A;  . PERIPHERAL VASCULAR CATHETERIZATION Left 08/07/2016   Procedure: A/V Shuntogram/Fistulagram;  Surgeon: Katha Cabal, MD;  Location: [facility];  Service: Cardiovascular;  Laterality: Left;  . PERIPHERAL VASCULAR CATHETERIZATION Left 09/22/2016   Procedure: A/V Fistulagram;  Surgeon: Katha Cabal, MD;  Location: [facility];  Service: Cardiovascular;  Laterality: Left;  . THYROIDECTOMY      SOCIAL HISTORY:   Social History   Tobacco Use  . Smoking status: Never Smoker  . Smokeless tobacco: Never Used  Substance Use Topics  . Alcohol use: No    Alcohol/week: 0.0 standard drinks    FAMILY HISTORY:   Family History  Problem Relation Age of Onset  . Renal Disease Brother        with renal transplant  . Diabetes Mellitus II Father   . Diabetes Mellitus II Mother   . Diabetes Mellitus II Brother   . Breast cancer Neg Hx     DRUG ALLERGIES:  No Known Allergies  REVIEW OF SYSTEMS:   Review of Systems  Constitutional: Negative for chills and fever.  HENT: Negative for congestion and sore throat.   Eyes: Negative for blurred vision and double vision.  Respiratory: Positive for shortness of breath. Negative for cough.   Cardiovascular: Negative for chest pain, palpitations and leg swelling.  Gastrointestinal: Negative for nausea and vomiting.  Musculoskeletal: Negative for back pain and neck pain.  Neurological: Negative for  dizziness and headaches.  Psychiatric/Behavioral: Negative for depression. The patient is not nervous/anxious.     MEDICATIONS AT HOME:   Prior to Admission medications   Medication Sig Start Date End Date Taking? Authorizing Provider  acetaminophen (TYLENOL) 500 MG tablet Take 500 mg by mouth every 8 (eight) hours as needed for mild pain or moderate pain.   Yes [provider]  atorvastatin (LIPITOR) 40 MG tablet Take 40 mg by mouth at bedtime.   Yes [provider]  calcium acetate (PHOSLO) 667 MG capsule Take 1,334 mg by mouth 3 (three) times daily with meals.    Yes [provider]  carvedilol (COREG) 6.25 MG tablet Take 12.5 mg by mouth at bedtime.  07/19/17  Yes [provider]  docusate sodium (COLACE) 50 MG capsule Take 200 mg by mouth 2 (two) times daily.    Yes [provider]  enalapril (VASOTEC) 20 MG tablet Take 20 mg by mouth at bedtime.  07/28/17  Yes [provider]  furosemide (LASIX) 80 MG tablet Take 80 mg by mouth every Tuesday, Thursday, Saturday, and Sunday. In the morning   Yes [provider]  Insulin Glargine (LANTUS SOLOSTAR) 100 UNIT/ML Solostar Pen Inject 20 Units into the skin daily at 10 pm.  Yes [provider]  levothyroxine (SYNTHROID, LEVOTHROID) 175 MCG tablet Take 175 mcg by mouth daily before breakfast. 1 hour before breakfast 07/20/17  Yes [provider]  lidocaine-prilocaine (EMLA) cream Apply 1 application topically as needed (port access).   Yes [provider]  polyethylene glycol (MIRALAX / GLYCOLAX) packet Take 17 g by mouth daily. Patient taking differently: Take 17 g by mouth 2 (two) times daily.  05/08/17  Yes Gladstone Lighter, MD  B Complex-C-Folic Acid (RENA-VITE RX PO) Take by mouth.    [provider]  clopidogrel (PLAVIX) 75 MG tablet Take 75 mg by mouth daily with breakfast.  09/08/16   [provider]  Dulaglutide (TRULICITY) 7.16  RC/7.8LF SOPN Inject 0.75 mg into the skin once a week. Takes on saturdays    [provider]  fosinopril (MONOPRIL) 40 MG tablet  05/25/18   [provider]  HYDROcodone-acetaminophen (NORCO) 7.5-325 MG tablet Take 1 tablet by mouth every 6 (six) hours as needed for moderate pain. 04/14/18   Troxler, Rodman Key, DPM  insulin detemir (LEVEMIR) 100 UNIT/ML injection Inject 0.16 mLs (16 Units total) into the skin at bedtime. 05/07/17   Gladstone Lighter, MD  Insulin Pen Needle (NOVOFINE PLUS) 32G X 4 MM MISC Use 1 Needle free injection nightly 06/23/18   [provider]  multivitamin (RENA-VIT) TABS tablet Take 1 tablet by mouth daily with breakfast.     [provider]      VITAL SIGNS:  Blood pressure (!) 99/54, pulse 82, temperature 97.6 F (36.4 C), temperature source Axillary, resp. rate (!) 46, height '5\' 6"'$  (1.676 m), weight 99.2 kg, SpO2 95 %.  PHYSICAL EXAMINATION:  Physical Exam  GENERAL:  68 y.o.-year-old patient lying in the bed with no acute distress.  EYES: Pupils equal, round, reactive to light and accommodation. No scleral icterus. Extraocular muscles intact.  HEENT: Head atraumatic, normocephalic. Oropharynx and nasopharynx clear.  NECK:  Supple, no jugular venous distention. No thyroid enlargement, no tenderness.  LUNGS: +comfortable breathing on BiPAP. +bibasilar crackles and coarse breath sounds. CARDIOVASCULAR: RRR, S1, S2 normal. No murmurs, rubs, or gallops.  ABDOMEN: Soft, nontender, nondistended. Bowel sounds present. No organomegaly or mass.  EXTREMITIES: No pedal edema, cyanosis, or clubbing.  NEUROLOGIC: Cranial nerves II through XII are intact. +global weakness. Sensation intact. Gait not checked.  PSYCHIATRIC: The patient is alert and oriented x 3.  SKIN: No obvious rash, lesion, or ulcer.   LABORATORY PANEL:   CBC No results for input(s): WBC, HGB, HCT, PLT in the last 168 hours.  ------------------------------------------------------------------------------------------------------------------  Chemistries  No results for input(s): NA, K, CL, CO2, GLUCOSE, BUN, CREATININE, CALCIUM, MG, AST, ALT, ALKPHOS, BILITOT in the last 168 hours.  Invalid input(s): GFRCGP ------------------------------------------------------------------------------------------------------------------  Cardiac Enzymes No results for input(s): TROPONINI in the last 168 hours. ------------------------------------------------------------------------------------------------------------------  RADIOLOGY:  Dg Chest Port 1 View  Result Date: 02/02/2019 CLINICAL DATA:  68 year old female with respiratory distress and hypertension EXAM: PORTABLE CHEST 1 VIEW COMPARISON:  05/02/2017, 05/06/2016 FINDINGS: Cardiomediastinal silhouette unchanged in size and contour. Since the prior there has been development of mixed reticulonodular opacities with developing airspace opacity in the right mid lung. No pneumothorax. No large pleural effusion. No displaced fracture. IMPRESSION: Bilateral reticulonodular and airspace opacities worst on the right, concerning for multifocal pneumonia. Asymmetric edema could also have this appearance. Electronically Signed   By: Corrie Mckusick D.O.   On: 02/02/2019 11:47      IMPRESSION AND PLAN:   Acute on chronic  hypoxic respiratory failure- CXR with bilateral airspace disease concerning for possible pulmonary edema vs multifocal pneumonia. Patient denies any fevers, chills, or cough, so suspect that pulmonary edema is more likely. -Currently receiving HD -Repeat CXR after HD -Check procalcitonin -Check ECHO -Hold on antibiotics for now, low threshold to start if patient develops fevers or signs of infection -Discussed plan with CCM, who are in agreement  Hypotension- likely due to receiving hydralazine post-op -Holding home coreg and enalapril for now -Currently on  levophed  ESRD on HD MWF -s/p thrombectomy of left AVF, angioplasty, and stent placement 4/16 with Dr. Lucky Cowboy -Receiving HD now -Nephrology following  Type 2 diabetes -Continue lantus and SSI  Hypothyroidism- stable -Continue home synthroid  Hyperlipidemia- stable -Continue home lipitor   All the records are reviewed and case discussed with ED provider. Management plans discussed with the patient, family and they are in agreement.  CODE STATUS: Full  TOTAL TIME TAKING CARE OF THIS PATIENT: 45 minutes.    Berna Spare Mayo M.D on 02/02/2019 at 12:57 PM  Between 7am to 6pm - Pager - [phone]  After 6pm go to [email or web address] - Proofreader  [facility]  Office  [phone]  CC: Primary care physician; Baxter Hire, MD   Note: This dictation was prepared with Dragon dictation along with smaller phrase technology. Any transcriptional errors that result from this process are unintentional.

## 2019-02-02 NOTE — Progress Notes (Signed)
PIV consult: RUE assessed with [place]. No suitable vein found.  R cephalic vein non-compressible. Lower brachial veins too deep for peripheral IV.  Superficial PIV attempt unsuccessful. RN made aware.

## 2019-02-02 NOTE — Progress Notes (Signed)
Pre HD Assessment   02/02/19 1125  Neurological  Level of Consciousness Alert  Orientation Level Oriented X4  Respiratory  Respiratory Pattern Regular  Bilateral Breath Sounds Expiratory wheezes;Diminished  Cough Non-productive  Cardiac  Cardiac Rhythm Heart block;NSR  Heart Block Type 1st degree AVB  Vascular  R Radial Pulse +2  L Radial Pulse +2  Integumentary  Integumentary (WDL) X  Skin Color Appropriate for ethnicity  Additional Integumentary Comments  (HD pt)  Musculoskeletal  Musculoskeletal (WDL) X  Generalized Weakness Yes  GU Assessment  Genitourinary (WDL) X  Genitourinary Symptoms Oliguria  Psychosocial  Psychosocial (WDL) WDL

## 2019-02-03 ENCOUNTER — Inpatient Hospital Stay
Admission: RE | Admit: 2019-02-03 | Discharge: 2019-02-03 | Disposition: A | Discharge status: Home / Self Care | Payer: Medicare Other | Source: Home / Self Care | Attending: Internal Medicine | Admitting: Internal Medicine

## 2019-02-03 ENCOUNTER — Inpatient Hospital Stay: Payer: Medicare Other

## 2019-02-03 ENCOUNTER — Encounter: Payer: Self-pay | Admitting: Internal Medicine

## 2019-02-03 LAB — BASIC METABOLIC PANEL
Anion gap: 14 (ref 5–15)
BUN: 65 mg/dL — ABNORMAL HIGH (ref 8–23)
CO2: 22 mmol/L (ref 22–32)
Calcium: 8.1 mg/dL — ABNORMAL LOW (ref 8.9–10.3)
Chloride: 101 mmol/L (ref 98–111)
Creatinine, Ser: 9.29 mg/dL — ABNORMAL HIGH (ref 0.44–1.00)
GFR calc Af Amer: 5 mL/min — ABNORMAL LOW (ref >60)
GFR calc non Af Amer: 4 mL/min — ABNORMAL LOW (ref >60)
Glucose, Bld: 268 mg/dL — ABNORMAL HIGH (ref 70–99)
Potassium: 5.2 mmol/L — ABNORMAL HIGH (ref 3.5–5.1)
Sodium: 137 mmol/L (ref 135–145)

## 2019-02-03 LAB — RENAL FUNCTION PANEL
Albumin: 3.5 g/dL (ref 3.5–5.0)
Anion gap: 12 (ref 5–15)
BUN: 69 mg/dL — ABNORMAL HIGH (ref 8–23)
CO2: 26 mmol/L (ref 22–32)
Calcium: 8.3 mg/dL — ABNORMAL LOW (ref 8.9–10.3)
Chloride: 101 mmol/L (ref 98–111)
Creatinine, Ser: 10.04 mg/dL — ABNORMAL HIGH (ref 0.44–1.00)
GFR calc Af Amer: 4 mL/min — ABNORMAL LOW (ref >60)
GFR calc non Af Amer: 4 mL/min — ABNORMAL LOW (ref >60)
Glucose, Bld: 234 mg/dL — ABNORMAL HIGH (ref 70–99)
Phosphorus: 7.4 mg/dL — ABNORMAL HIGH (ref 2.5–4.6)
Potassium: 4.7 mmol/L (ref 3.5–5.1)
Sodium: 139 mmol/L (ref 135–145)

## 2019-02-03 LAB — CBC
HCT: 34.7 % — ABNORMAL LOW (ref 36.0–46.0)
Hemoglobin: 10.8 g/dL — ABNORMAL LOW (ref 12.0–15.0)
MCH: 32 pg (ref 26.0–34.0)
MCHC: 31.1 g/dL (ref 30.0–36.0)
MCV: 102.7 fL — ABNORMAL HIGH (ref 80.0–100.0)
Platelets: 204 10*3/uL (ref 150–400)
RBC: 3.38 MIL/uL — ABNORMAL LOW (ref 3.87–5.11)
RDW: 12.8 % (ref 11.5–15.5)
WBC: 11 10*3/uL — ABNORMAL HIGH (ref 4.0–10.5)
nRBC: 0.2 % (ref 0.0–0.2)

## 2019-02-03 LAB — ECHOCARDIOGRAM COMPLETE
Height: 66 in
Weight: 3491.2 oz

## 2019-02-03 LAB — GLUCOSE, CAPILLARY
Glucose-Capillary: 195 mg/dL — ABNORMAL HIGH (ref 70–99)
Glucose-Capillary: 205 mg/dL — ABNORMAL HIGH (ref 70–99)

## 2019-02-03 LAB — MRSA PCR SCREENING: MRSA by PCR: NEGATIVE

## 2019-02-03 LAB — PROCALCITONIN: Procalcitonin: 4.43 ng/mL

## 2019-02-03 MED ORDER — ALUM & MAG HYDROXIDE-SIMETH 200-200-20 MG/5ML PO SUSP
30.0000 mL | ORAL | Status: DC | PRN
  Administered 2019-02-03 (×2): 30 mL via ORAL
  Filled 2019-02-03 (×2): qty 30

## 2019-02-03 MED ORDER — SODIUM CHLORIDE 0.9% FLUSH
10.0000 mL | INTRAVENOUS | Status: DC | PRN
  Administered 2019-02-03: 11:00:00 10 mL
  Filled 2019-02-03: qty 40

## 2019-02-03 MED ORDER — INSULIN ASPART 100 UNIT/ML ~~LOC~~ SOLN: 0.0000 [IU] | Freq: Every day | SUBCUTANEOUS | Status: DC

## 2019-02-03 MED ORDER — SODIUM CHLORIDE 0.9% FLUSH
10.0000 mL | Freq: Two times a day (BID) | INTRAVENOUS | Status: DC
  Administered 2019-02-03 (×2): 10 mL

## 2019-02-03 MED ORDER — INSULIN ASPART 100 UNIT/ML ~~LOC~~ SOLN
0.0000 [IU] | Freq: Three times a day (TID) | SUBCUTANEOUS | Status: DC
  Administered 2019-02-03 (×2): 3 [IU] via SUBCUTANEOUS
  Filled 2019-02-03 (×2): qty 1

## 2019-02-03 MED ORDER — CALCIUM ACETATE (PHOS BINDER) 667 MG PO CAPS
1334.0000 mg | ORAL_CAPSULE | Freq: Three times a day (TID) | ORAL | Status: DC
  Administered 2019-02-03: 13:00:00 1334 mg via ORAL
  Filled 2019-02-03: qty 2

## 2019-02-03 NOTE — Progress Notes (Signed)
Notified patients sister, Polo Riley, that the patient will be moving to room 208.

## 2019-02-03 NOTE — Progress Notes (Signed)
Discharge instructions reviewed with patient. Questions encouraged and answered to patient's satisfaction. Belongings with patient upon discharge. Kendra [name]

## 2019-02-03 NOTE — Progress Notes (Signed)
*  PRELIMINARY RESULTS* Echocardiogram 2D Echocardiogram has been performed.  Sherrie Sport 02/03/2019, 8:20 AM

## 2019-02-03 NOTE — TOC Initial Note (Signed)
Transition of Care [facility]) - Initial/Assessment Note    Patient Details  Name: Kendra [name] MRN: [id number] Date of Birth: [birth year]  Transition of [facility]) CM/SW Contact:    Beverly Sessions, RN Phone Number: 02/03/2019, 2:26 PM  Clinical Narrative:                 Patient admitted with acute hypoxic respiratory failure.  Patient lives at home alone.  Patient states that her brother lives "about 15 minutes away" and can take her to appointments if she isn't able to drive her self  [facility] HD liaison notified of admission and discharge.   PCP Edwina Barth.   Pharmacy [facility]. Denies issues with obtaining medications  MD has ordered home health RN, PT, and O2.  Patient agreeable to home health services.  States that she has had [facility] in the past and would like to use them again.  CMS Medicare.gov Compare Post Acute Care list reviewed with patient.  Referral made to [facility] with [place].  Bedside RN has checked o2 sats and patient does not qualify for home O2, MD notified.     Expected Discharge Plan: [place] Barriers to Discharge: Barriers Resolved   Patient Goals and CMS Choice Patient states their goals for this hospitalization and ongoing recovery are:: "i really dont want to have to have oxygen" CMS Medicare.gov Compare Post Acute Care list provided to:: Patient Choice offered to / list presented to : Patient  Expected Discharge Plan and Services Expected Discharge Plan: [place]   Discharge Planning Services: CM Consult Post Acute Care Choice: [facility] [employer] [facility] arrangements for the past 2 months: Single Family Home Expected Discharge Date: 02/03/19                   HH Arranged: RN, PT [place] Agency: [facility] ([facility])  Prior Living Arrangements/Services Living arrangements for the past 2 months: Single Family Home Lives with:: Self   Do you feel safe going back to the place where  you live?: Yes               Activities of Daily Living Home Assistive Devices/Equipment: CBG Meter ADL Screening (condition at time of admission) Patient's cognitive ability adequate to safely complete daily activities?: Yes Is the patient deaf or have difficulty hearing?: No Does the patient have difficulty seeing, even when wearing glasses/contacts?: No Does the patient have difficulty concentrating, remembering, or making decisions?: No Patient able to express need for assistance with ADLs?: Yes Does the patient have difficulty dressing or bathing?: No Independently performs ADLs?: Yes (appropriate for developmental age) Does the patient have difficulty walking or climbing stairs?: Yes Weakness of Legs: None Weakness of Arms/Hands: None  Permission Sought/Granted                  Emotional Assessment Appearance:: Appears stated age Attitude/Demeanor/Rapport: Gracious Affect (typically observed): Accepting Orientation: : Oriented to Self, Oriented to Place, Oriented to  Time, Oriented to Situation   Psych Involvement: No (comment)  Admission diagnosis:  Pulmonary edema [J81.1] Patient Active Problem List   Diagnosis Date Noted  . Pulmonary edema 02/02/2019  . Acute on chronic respiratory failure with hypoxia ([place]) 02/02/2019  . Pyelonephritis 05/04/2017  . Complication from renal dialysis device 09/21/2016  . ESRD on dialysis ([place]) 09/21/2016  . BP (high blood pressure) 07/15/2015  . Diabetes mellitus, type 2 ([place]) 07/15/2015  . Absolute anemia  07/15/2015   PCP:  Baxter Hire, MD Pharmacy:   [facility], [place] [address] [place] [address] Phone: [phone] Fax: [phone]  [id number] - [place], [place] - [address] [place] [address] Phone: [phone] Fax: [phone]     Social Determinants of Health (SDOH) Interventions    Readmission Risk Interventions No  flowsheet data found.

## 2019-02-03 NOTE — Discharge Summary (Signed)
[facility] NAME: Kendra [name]    MR#:  [id number]  DATE OF BIRTH:  1950-11-26  DATE OF ADMISSION:  02/02/2019 ADMITTING PHYSICIAN: No admitting provider for patient encounter.  DATE OF DISCHARGE: 02/03/2019  PRIMARY CARE PHYSICIAN: Baxter Hire, MD    ADMISSION DIAGNOSIS:  Pulmonary edema [J81.1]  DISCHARGE DIAGNOSIS:  Active Problems:   Pulmonary edema   Acute on chronic respiratory failure with hypoxia (HCC)   SECONDARY DIAGNOSIS:   Past Medical History:  Diagnosis Date  . Anemia of chronic renal failure    DIALYSIS m/w/f  . Diabetic neuropathy ([place])   . Diabetic retinopathy ([place])   . Diastolic CHF ([place])   . ESRD (end stage renal disease) ([place])   . GERD (gastroesophageal reflux disease)   . Hyperparathyroidism, secondary renal ([place])   . Hypertension   . Hypothyroidism   . Insulin dependent diabetes mellitus ([place])   . Neuropathy    feet and fingers  . Osteoarthritis    bilateral shoulders  . Peripheral vascular disease ([place])   . Primary hypertension   . Shingles outbreak 04/2017    HOSPITAL COURSE:  68 year old female with end-stage renal disease on hemodialysis Monday, Wednesday and Friday, anemia of chronic disease and essential hypertension who was admitted for acute respiratory distress after thrombectomy for aVF.  1.  Acute hypoxic respiratory failure in the setting of acute on chronic diastolic heart failure from pulmonary edema from underlying end-stage renal disease  2.  Pulmonary edema from underlying end-stage renal disease: Patient symptoms have improved with dialysis.   3.  End-stage renal disease on hemodialysis:She will continue dialysis Monday, Wednesday and Friday.  4.  Chronic diastolic heart failure: Plan as stated above  5.  Diabetes: Patient is followed by endocrinology outpatient.  She will continue with ADA diet and Lantus.  6.  Essential hypertension: She will continue Coreg and  fosinopril  7.  Hypothyroidism: Continue Synthroid   DISCHARGE CONDITIONS AND DIET:   Stable for discharge on renal diet  CONSULTS OBTAINED:    DRUG ALLERGIES:  No Known Allergies  DISCHARGE MEDICATIONS:   Allergies as of 02/03/2019   No Known Allergies     Medication List    STOP taking these medications   HYDROcodone-acetaminophen 7.5-325 MG tablet Commonly known as:  Norco   insulin detemir 100 UNIT/ML injection Commonly known as:  LEVEMIR   Trulicity 5.32 DJ/2.4QA Sopn Generic drug:  Dulaglutide     TAKE these medications   acetaminophen 500 MG tablet Commonly known as:  TYLENOL Take 500 mg by mouth every 8 (eight) hours as needed for mild pain or moderate pain.   atorvastatin 40 MG tablet Commonly known as:  LIPITOR Take 40 mg by mouth at bedtime.   calcium acetate 667 MG capsule Commonly known as:  PHOSLO Take 1,334 mg by mouth 3 (three) times daily with meals.   carvedilol 6.25 MG tablet Commonly known as:  COREG Take 12.5 mg by mouth at bedtime.   clopidogrel 75 MG tablet Commonly known as:  PLAVIX Take 75 mg by mouth daily with breakfast.   docusate sodium 50 MG capsule Commonly known as:  COLACE Take 200 mg by mouth 2 (two) times daily.   fosinopril 40 MG tablet Commonly known as:  MONOPRIL Take 40 mg by mouth daily.   furosemide 80 MG tablet Commonly known as:  LASIX Take 80 mg by mouth every Tuesday, Thursday, Saturday, and Sunday. In the morning  Lantus SoloStar 100 UNIT/ML Solostar Pen Generic drug:  Insulin Glargine Inject 20 Units into the skin daily at 10 pm.   levothyroxine 175 MCG tablet Commonly known as:  SYNTHROID Take 175 mcg by mouth daily before breakfast.   lidocaine-prilocaine cream Commonly known as:  EMLA Apply 1 application topically as needed (port access).   polyethylene glycol 17 g packet Commonly known as:  MIRALAX / GLYCOLAX Take 17 g by mouth daily.         Today   CHIEF COMPLAINT:  Patient  is feeling much better than yesterday.  Denies shortness of breath or wheezing.   VITAL SIGNS:  Blood pressure (!) 92/53, pulse 81, temperature 98.6 F (37 C), temperature source Oral, resp. rate 20, height 5\' 6"  (1.676 m), weight 99 kg, SpO2 99 %.   REVIEW OF SYSTEMS:  Review of Systems  Constitutional: Negative.  Negative for chills, fever and malaise/fatigue.  HENT: Negative.  Negative for ear discharge, ear pain, hearing loss, nosebleeds and sore throat.   Eyes: Negative.  Negative for blurred vision and pain.  Respiratory: Negative.  Negative for cough, hemoptysis, shortness of breath and wheezing.   Cardiovascular: Negative.  Negative for chest pain, palpitations and leg swelling.  Gastrointestinal: Negative.  Negative for abdominal pain, blood in stool, diarrhea, nausea and vomiting.  Genitourinary: Negative.  Negative for dysuria.  Musculoskeletal: Negative.  Negative for back pain.  Skin: Negative.   Neurological: Negative for dizziness, tremors, speech change, focal weakness, seizures and headaches.  Endo/Heme/Allergies: Negative.  Does not bruise/bleed easily.  Psychiatric/Behavioral: Negative.  Negative for depression, hallucinations and suicidal ideas.     PHYSICAL EXAMINATION:  GENERAL:  68 y.o.-year-old patient lying in the bed with no acute distress.  NECK:  Supple, no jugular venous distention. No thyroid enlargement, no tenderness.  LUNGS: Normal breath sounds bilaterally, no wheezing, rales,rhonchi  No use of accessory muscles of respiration.  CARDIOVASCULAR: S1, S2 normal. No murmurs, rubs, or gallops.  ABDOMEN: Soft, non-tender, non-distended. Bowel sounds present. No organomegaly or mass.  EXTREMITIES: No pedal edema, cyanosis, or clubbing.  PSYCHIATRIC: The patient is alert and oriented x 3.  SKIN: No obvious rash, lesion, or ulcer.   DATA REVIEW:   CBC Recent Labs  Lab 02/03/19 0452  WBC 11.0*  HGB 10.8*  HCT 34.7*  PLT 204    Chemistries   Recent Labs  Lab 02/03/19 0500  NA 139  K 4.7  CL 101  CO2 26  GLUCOSE 234*  BUN 69*  CREATININE 10.04*  CALCIUM 8.3*    Cardiac Enzymes No results for input(s): TROPONINI in the last 168 hours.  Microbiology Results  @MICRORSLT48 @  RADIOLOGY:  Dg Chest 1 View  Result Date: 02/03/2019 CLINICAL DATA:  Follow-up pulmonary edema EXAM: CHEST  1 VIEW COMPARISON:  02/02/2019 FINDINGS: Cardiac shadow remains enlarged. Right jugular central line is again seen. The overall inspiratory effort is poor with crowding of the vascular and parenchymal markings. No new focal abnormality is seen. IMPRESSION: Stable appearance of the chest when compared with the previous day. Electronically Signed   By: Inez Catalina M.D.   On: 02/03/2019 03:29   Dg Chest Port 1 View  Result Date: 02/02/2019 CLINICAL DATA:  Check central line placement EXAM: PORTABLE CHEST 1 VIEW COMPARISON:  02/02/2019 FINDINGS: Right jugular catheter has been slightly withdrawn and now lies at the cavoatrial junction. Cardiac shadow is stable. The overall inspiratory effort is poor with crowding of the vascular markings and parenchymal markings. Again  no pneumothorax is seen. IMPRESSION: Central line in satisfactory position. No new focal abnormality is noted. Electronically Signed   By: Inez Catalina M.D.   On: 02/02/2019 23:56   Dg Chest Port 1 View  Result Date: 02/02/2019 CLINICAL DATA:  Check central line placement EXAM: PORTABLE CHEST 1 VIEW COMPARISON:  Film from earlier in the same day. FINDINGS: Cardiac shadow remains enlarged. Aortic calcifications are again noted. New right jugular central line is noted in the superior aspect of the right atrium. No pneumothorax is noted. Poor inspiratory effort is noted with crowding of the previously seen opacities bilaterally. IMPRESSION: No pneumothorax following central line placement. The remainder of the exam is stable. Electronically Signed   By: Inez Catalina M.D.   On: 02/02/2019  21:28   Dg Chest Port 1 View  Result Date: 02/02/2019 CLINICAL DATA:  68 year old female with respiratory distress and hypertension EXAM: PORTABLE CHEST 1 VIEW COMPARISON:  05/02/2017, 05/06/2016 FINDINGS: Cardiomediastinal silhouette unchanged in size and contour. Since the prior there has been development of mixed reticulonodular opacities with developing airspace opacity in the right mid lung. No pneumothorax. No large pleural effusion. No displaced fracture. IMPRESSION: Bilateral reticulonodular and airspace opacities worst on the right, concerning for multifocal pneumonia. Asymmetric edema could also have this appearance. Electronically Signed   By: Corrie Mckusick D.O.   On: 02/02/2019 11:47      Allergies as of 02/03/2019   No Known Allergies     Medication List    STOP taking these medications   HYDROcodone-acetaminophen 7.5-325 MG tablet Commonly known as:  Norco   insulin detemir 100 UNIT/ML injection Commonly known as:  LEVEMIR   Trulicity 2.26 JF/3.5KT Sopn Generic drug:  Dulaglutide     TAKE these medications   acetaminophen 500 MG tablet Commonly known as:  TYLENOL Take 500 mg by mouth every 8 (eight) hours as needed for mild pain or moderate pain.   atorvastatin 40 MG tablet Commonly known as:  LIPITOR Take 40 mg by mouth at bedtime.   calcium acetate 667 MG capsule Commonly known as:  PHOSLO Take 1,334 mg by mouth 3 (three) times daily with meals.   carvedilol 6.25 MG tablet Commonly known as:  COREG Take 12.5 mg by mouth at bedtime.   clopidogrel 75 MG tablet Commonly known as:  PLAVIX Take 75 mg by mouth daily with breakfast.   docusate sodium 50 MG capsule Commonly known as:  COLACE Take 200 mg by mouth 2 (two) times daily.   fosinopril 40 MG tablet Commonly known as:  MONOPRIL Take 40 mg by mouth daily.   furosemide 80 MG tablet Commonly known as:  LASIX Take 80 mg by mouth every Tuesday, Thursday, Saturday, and Sunday. In the morning    Lantus SoloStar 100 UNIT/ML Solostar Pen Generic drug:  Insulin Glargine Inject 20 Units into the skin daily at 10 pm.   levothyroxine 175 MCG tablet Commonly known as:  SYNTHROID Take 175 mcg by mouth daily before breakfast.   lidocaine-prilocaine cream Commonly known as:  EMLA Apply 1 application topically as needed (port access).   polyethylene glycol 17 g packet Commonly known as:  MIRALAX / GLYCOLAX Take 17 g by mouth daily.          Management plans discussed with the patient and she is in agreement. Stable for discharge   Patient should follow up with pcp  CODE STATUS:     Code Status Orders  (From admission, onward)  Start     Ordered   02/02/19 1303  Full code  Continuous     02/02/19 1306        Code Status History    Date Active Date Inactive Code Status Order ID Comments User Context   05/04/2017 0653 05/08/2017 1824 Full Code [id number]  Harrie Foreman, MD Inpatient   08/03/2016 1512 08/03/2016 2023 Full Code [id number]  Algernon Huxley, MD Inpatient   05/14/2016 1234 05/14/2016 1631 Full Code [id number]  Algernon Huxley, MD Inpatient      TOTAL TIME TAKING CARE OF THIS PATIENT: 38 minutes.    Note: This dictation was prepared with Dragon dictation along with smaller phrase technology. Any transcriptional errors that result from this process are unintentional.  Bettey Costa M.D on 02/03/2019 at 11:00 AM  Between 7am to 6pm - Pager - [phone] After 6pm go to [email or web address] - password [email or web address] [facility]  Office  [phone]  CC: Primary care physician; Baxter Hire, MD

## 2019-02-03 NOTE — Progress Notes (Signed)
[facility]  ROUNDING NOTE   Subjective:  Patient did have hemodialysis treatment yesterday. Significantly improved today. Much less short of breath at the moment. Due for another dialysis session today.   Objective:  Vital signs in last 24 hours:  Temp:  [97.6 F (36.4 C)-98.6 F (37 C)] 98.6 F (37 C) (04/17 0427) Pulse Rate:  [80-104] 81 (04/17 0427) Resp:  [18-36] 20 (04/17 0427) BP: (91-183)/(51-100) 92/53 (04/17 0427) SpO2:  [95 %-100 %] 99 % (04/17 0427) Weight:  [90.7 kg-99.2 kg] 99 kg (04/17 0500)  Weight change:  Filed Weights   02/02/19 1430 02/03/19 0207 02/03/19 0500  Weight: 98.8 kg 90.7 kg 99 kg    Intake/Output: I/O last 3 completed shifts: In: 180.6 [P.O.:120; I.V.:60.6] Out: 500 [Other:500]   Intake/Output this shift:  Total I/O In: 10 [I.V.:10] Out: -   Physical Exam: General: Critically ill-appearing  Head: Normocephalic, atraumatic. Moist oral mucosal membranes  Eyes: Anicteric  Neck: Supple, trachea midline  Lungs:  Clear anteriorly  Heart: S1S2 no rubs  Abdomen:  Soft, nontender, bowel sounds present  Extremities: Trace peripheral edema.  Neurologic: Awake, alert, following commands  Skin: No lesions  Access: LUE AVF    Basic Metabolic Panel: Recent Labs  Lab 02/03/19 0035 02/03/19 0500  NA 137 139  K 5.2* 4.7  CL 101 101  CO2 22 26  GLUCOSE 268* 234*  BUN 65* 69*  CREATININE 9.29* 10.04*  CALCIUM 8.1* 8.3*  PHOS  --  7.4*    Liver Function Tests: Recent Labs  Lab 02/03/19 0500  ALBUMIN 3.5   No results for input(s): LIPASE, AMYLASE in the last 168 hours. No results for input(s): AMMONIA in the last 168 hours.  CBC: Recent Labs  Lab 02/02/19 1603 02/03/19 0452  WBC 14.2* 11.0*  NEUTROABS 11.8*  --   HGB 13.3 10.8*  HCT 41.7 34.7*  MCV 103.5* 102.7*  PLT 156 204    Cardiac Enzymes: No results for input(s): CKTOTAL, CKMB, CKMBINDEX, TROPONINI in the last 168 hours.  BNP: Invalid input(s):  POCBNP  CBG: Recent Labs  Lab 02/02/19 1053 02/02/19 1641 02/02/19 1944 02/02/19 2152 02/03/19 0736  GLUCAP 160* 137* 178* 296* 195*    Microbiology: Results for orders placed or performed during the hospital encounter of 02/02/19  MRSA PCR Screening     Status: None   Collection Time: 02/03/19 12:35 AM  Result Value Ref Range Status   MRSA by PCR NEGATIVE NEGATIVE Final    Comment:        The GeneXpert MRSA Assay (FDA approved for NASAL specimens only), is one component of a comprehensive MRSA colonization surveillance program. It is not intended to diagnose MRSA infection nor to guide or monitor treatment for MRSA infections. Performed at [facility], [address] [place],  [address]     Coagulation Studies: No results for input(s): LABPROT, INR in the last 72 hours.  Urinalysis: No results for input(s): COLORURINE, LABSPEC, PHURINE, GLUCOSEU, HGBUR, BILIRUBINUR, KETONESUR, PROTEINUR, UROBILINOGEN, NITRITE, LEUKOCYTESUR in the last 72 hours.  Invalid input(s): APPERANCEUR    Imaging: Dg Chest 1 View  Result Date: 02/03/2019 CLINICAL DATA:  Follow-up pulmonary edema EXAM: CHEST  1 VIEW COMPARISON:  02/02/2019 FINDINGS: Cardiac shadow remains enlarged. Right jugular central line is again seen. The overall inspiratory effort is poor with crowding of the vascular and parenchymal markings. No new focal abnormality is seen. IMPRESSION: Stable appearance of the chest when compared with the previous day. Electronically  Signed   By: Inez Catalina M.D.   On: 02/03/2019 03:29   Dg Chest Port 1 View  Result Date: 02/02/2019 CLINICAL DATA:  Check central line placement EXAM: PORTABLE CHEST 1 VIEW COMPARISON:  02/02/2019 FINDINGS: Right jugular catheter has been slightly withdrawn and now lies at the cavoatrial junction. Cardiac shadow is stable. The overall inspiratory effort is poor with crowding of the vascular markings and parenchymal markings. Again no  pneumothorax is seen. IMPRESSION: Central line in satisfactory position. No new focal abnormality is noted. Electronically Signed   By: Inez Catalina M.D.   On: 02/02/2019 23:56   Dg Chest Port 1 View  Result Date: 02/02/2019 CLINICAL DATA:  Check central line placement EXAM: PORTABLE CHEST 1 VIEW COMPARISON:  Film from earlier in the same day. FINDINGS: Cardiac shadow remains enlarged. Aortic calcifications are again noted. New right jugular central line is noted in the superior aspect of the right atrium. No pneumothorax is noted. Poor inspiratory effort is noted with crowding of the previously seen opacities bilaterally. IMPRESSION: No pneumothorax following central line placement. The remainder of the exam is stable. Electronically Signed   By: Inez Catalina M.D.   On: 02/02/2019 21:28   Dg Chest Port 1 View  Result Date: 02/02/2019 CLINICAL DATA:  68 year old female with respiratory distress and hypertension EXAM: PORTABLE CHEST 1 VIEW COMPARISON:  05/02/2017, 05/06/2016 FINDINGS: Cardiomediastinal silhouette unchanged in size and contour. Since the prior there has been development of mixed reticulonodular opacities with developing airspace opacity in the right mid lung. No pneumothorax. No large pleural effusion. No displaced fracture. IMPRESSION: Bilateral reticulonodular and airspace opacities worst on the right, concerning for multifocal pneumonia. Asymmetric edema could also have this appearance. Electronically Signed   By: Corrie Mckusick D.O.   On: 02/02/2019 11:47     Medications:   . sodium chloride    . sodium chloride    . norepinephrine (LEVOPHED) Adult infusion Stopped (02/02/19 1430)   . Chlorhexidine Gluconate Cloth  6 each Topical Q0600  . heparin  5,000 Units Subcutaneous Q8H  . insulin aspart  0-15 Units Subcutaneous TID WC  . insulin aspart  0-5 Units Subcutaneous QHS  . insulin glargine  10 Units Subcutaneous QHS  . lidocaine  5 mL Intradermal Once  .  morphine injection   1 mg Intravenous Once  . sodium chloride flush  10-40 mL Intracatheter Q12H   sodium chloride, sodium chloride, acetaminophen **OR** acetaminophen, alum & mag hydroxide-simeth, heparin, ondansetron **OR** ondansetron (ZOFRAN) IV, sodium chloride flush  Assessment/ Plan:  68 y.o. female with past medical history of end-stage renal disease on hemodialysis MWF, anemia chronic kidney disease, secondary hyperparathyroidism, hypertension, hypothyroidism, diabetes mellitus type 2, osteoarthritis, diabetic retinopathy, admitted for thrombectomy of AVF and subsequently developed acute respiratory failure.   [facility] [place]/MWF-2  1.  ESRD on HD MWF.  Patient underwent urgent dialysis yesterday for pulmonary edema.  Significantly improved today.  We will plan for another dialysis session today.  2.  Acute respiratory failure.  Chest x-ray is mentioned that this could potentially be asymmetric pulmonary edema.  However she was found to have rales in both lungs.  Significantly improved with dialysis yesterday.  3.  Anemia of chronic kidney disease.  Hemoglobin currently 10.8.  Hold off on Epogen at this time.  4.  Secondary hyperparathyroidism.  Phosphorus noted to be high at 7.4.  Repeat serum phosphorus today.  Restart on calcium acetate 2 tabs po tid/wm.    LOS: 1 Arisbeth Purrington  Kyshawn Teal 4/17/202011:18 AM

## 2019-02-03 NOTE — Progress Notes (Signed)
02/03/2019 SATURATION QUALIFICATIONS: (This note is used to comply with regulatory documentation for home oxygen)  Patient Saturations on Room Air at Rest = 88%  Patient Saturations on Room Air while Ambulating = 96%  Patient Saturations on 0 Liters of oxygen while Ambulating = 96%  Please briefly explain why patient needs home oxygen:  No need for home oxygen.  Dola Argyle, RN

## 2019-02-03 NOTE — Progress Notes (Signed)
Pre hemodialysis report received from Chauncy Passy, RN. Received patient in RDU via bed.. Awake, alert and verbally responsive. No actute distress noted. + Bruit/thrill noted to LUE AVF.  Accessed  AVF with two 15g needles without difficulty. Hemodialysis treatment initiated at 1440 via AVF. Plan for 3.5 hours hemodialysis with UF of 1.5    02/03/19 1440  Hand-Off documentation  Report received from (Full Name) Delbert Harness, RN  Vital Signs  Temp 98.2 F (36.8 C)  Temp Source Axillary  Pulse Rate 87  Resp 12  BP (!) 105/55  BP Location Right Arm  BP Method Automatic  Patient Position (if appropriate) Lying  Oxygen Therapy  SpO2 98 %  O2 Device Room Air  Time-Out for Hemodialysis  What Procedure? dialysis   Pt Identifiers(min of two) First/Last Name;MRN/Account#  Correct Site? Yes  Correct Side? Yes  Correct Procedure? Yes  Consents Verified? Yes  Rad Studies Available? N/A  Engineer, civil (consulting) Number 3  Station Number 2  UF/Alarm Test Passed  Conductivity: Meter 14  Conductivity: Machine  13.7  pH 7.4  Reverse Osmosis main  Normal Saline Lot Number [id number]  Dialyzer Lot Number [id number]  Disposable Set Lot Number [id number]  Machine Temperature 98.6 F (37 C)  Musician and Audible Yes  Blood Lines Intact and Secured Yes  Pre Treatment Patient Checks  Vascular access used during treatment Fistula  Hepatitis B Surface Antigen Results Negative  Date Hepatitis B Surface Antigen Drawn 10/07/18  Hepatitis B Surface Antibody  (immune)  Date Hepatitis B Surface Antibody Drawn 10/07/18  Hemodialysis Consent Verified Yes  Hemodialysis Standing Orders Initiated Yes  Prime Ordered Normal Saline  Length of  DialysisTreatment -hour(s) 3.5 Hour(s)  Dialyzer Elisio 17H NR  Dialysate 2K, 2.5 Ca  Dialysate Flow Ordered 600  Blood Flow Rate Ordered 400 mL/min  Ultrafiltration Goal 1500 Liters  During Hemodialysis Assessment  Blood Flow Rate (mL/min) 400 mL/min   Arterial Pressure (mmHg) -130 mmHg  Venous Pressure (mmHg) 140 mmHg  Transmembrane Pressure (mmHg) 60 mmHg  Ultrafiltration Rate (mL/min) 570 mL/min  Dialysate Flow Rate (mL/min) 600 ml/min  Conductivity: Machine  14  HD Safety Checks Performed Yes  Dialysis Fluid Bolus Normal Saline  Bolus Amount (mL) 250 mL  Intra-Hemodialysis Comments Tx initiated  L as tolerated.

## 2019-02-03 NOTE — Progress Notes (Signed)
Follow up CXR following central line insertion revealed line tip in the superior right atrium.  Patient was having no arrhythmias.  Central line was withdrawn under sterile fashion by 1 cm and secured at 16 cm.  Site was again cleansed with chlorhexidine with placement of Biopatch and Tegaderm.  Repeat chest x-ray obtained, showing that line tip is in the cavoatrial junction.  Line is now in satisfactory position and ready for use.  No pneumothorax or other complications noted.     Darel Hong, AGACNP-BC [facility] Pager: [phone] Cell: [phone]

## 2019-02-03 NOTE — Progress Notes (Signed)
Hemodialysis treatment completed at 1810.  Net UF removed 1L. Blood rinsed back, Needles removed one at a time and manual pressure held to AVFuntil hemostasis achieved. No active bleeding upon patient departure from acute room. Post hemodialysis report given to J. Dougherty   02/03/19 1810  Hand-Off documentation  Report given to (Full Name) Delbert Harness, RN  Vital Signs  Pulse Rate 94  Resp 13  BP (!) 103/56  BP Location Right Arm  BP Method Automatic  Patient Position (if appropriate) Lying  During Hemodialysis Assessment  Intra-Hemodialysis Comments Tx completed  Post-Hemodialysis Assessment  Rinseback Volume (mL) 250 mL  KECN 69.6 V  Dialyzer Clearance Lightly streaked  Duration of HD Treatment -hour(s) 3.5 hour(s)  Hemodialysis Intake (mL) 500 mL  UF Total -Machine (mL) 1500 mL  Net UF (mL) 1000 mL  Tolerated HD Treatment Yes  AVG/AVF Arterial Site Held (minutes) 15 minutes  AVG/AVF Venous Site Held (minutes) 15 minutes  Education / Care Plan  Dialysis Education Provided Yes  Documented Education in Care Plan Yes  Outpatient Plan of Care Reviewed and on Chart Yes    RN.

## 2019-02-04 LAB — HIV ANTIBODY (ROUTINE TESTING W REFLEX): HIV Screen 4th Generation wRfx: NONREACTIVE

## 2019-03-03 ENCOUNTER — Encounter (INDEPENDENT_AMBULATORY_CARE_PROVIDER_SITE_OTHER): Payer: Medicare Other

## 2019-03-03 ENCOUNTER — Ambulatory Visit (INDEPENDENT_AMBULATORY_CARE_PROVIDER_SITE_OTHER): Payer: Medicare Other | Admitting: Nurse Practitioner

## 2019-03-14 ENCOUNTER — Other Ambulatory Visit (INDEPENDENT_AMBULATORY_CARE_PROVIDER_SITE_OTHER): Payer: Self-pay | Admitting: Nurse Practitioner

## 2019-03-14 ENCOUNTER — Other Ambulatory Visit: Payer: Self-pay

## 2019-03-14 ENCOUNTER — Ambulatory Visit (INDEPENDENT_AMBULATORY_CARE_PROVIDER_SITE_OTHER): Payer: Medicare Other

## 2019-03-14 ENCOUNTER — Ambulatory Visit (INDEPENDENT_AMBULATORY_CARE_PROVIDER_SITE_OTHER): Payer: Medicare Other | Admitting: Nurse Practitioner

## 2019-03-14 ENCOUNTER — Encounter (INDEPENDENT_AMBULATORY_CARE_PROVIDER_SITE_OTHER): Payer: Self-pay | Admitting: Nurse Practitioner

## 2019-03-14 VITALS — BP 169/67 | HR 77 | Resp 10 | Ht 66.0 in | Wt 222.0 lb

## 2019-03-14 DIAGNOSIS — Z992 Dependence on renal dialysis: Secondary | ICD-10-CM

## 2019-03-14 DIAGNOSIS — N186 End stage renal disease: Secondary | ICD-10-CM

## 2019-03-14 DIAGNOSIS — I10 Essential (primary) hypertension: Secondary | ICD-10-CM

## 2019-03-14 DIAGNOSIS — Z95820 Peripheral vascular angioplasty status with implants and grafts: Secondary | ICD-10-CM | POA: Diagnosis not present

## 2019-03-14 DIAGNOSIS — E1122 Type 2 diabetes mellitus with diabetic chronic kidney disease: Secondary | ICD-10-CM | POA: Diagnosis not present

## 2019-03-14 DIAGNOSIS — I120 Hypertensive chronic kidney disease with stage 5 chronic kidney disease or end stage renal disease: Secondary | ICD-10-CM

## 2019-03-14 DIAGNOSIS — Z794 Long term (current) use of insulin: Secondary | ICD-10-CM

## 2019-03-14 DIAGNOSIS — Z79899 Other long term (current) drug therapy: Secondary | ICD-10-CM

## 2019-03-14 NOTE — Progress Notes (Signed)
SUBJECTIVE:  Patient ID: Kendra [name], female    DOB: 1950-10-28, 68 y.o.   MRN: [id number] Chief Complaint  Patient presents with  . Follow-up    HPI  Kendra [name] is a 68 y.o. female The patient returns to the office for followup status post intervention of the dialysis access left brachial cephalic AVF. Following the intervention the access function has significantly improved, with better flow rates and improved KT/V. The patient has not been experiencing increased bleeding times following decannulation and the patient denies increased recirculation. The patient denies an increase in arm swelling. At the present time the patient denies hand pain.  The patient denies amaurosis fugax or recent TIA symptoms. There are no recent neurological changes noted. The patient denies claudication symptoms or rest pain symptoms. The patient denies history of DVT, PE or superficial thrombophlebitis. The patient denies recent episodes of angina or shortness of breath.   Patient has a flow volume of 1848.  The left brachial cephalic AVF is patent with new mid arm stent in the cephalic vein. No significant stenosis seen.  Thrombus surrounds stent but no flow issues through the stent.      Past Medical History:  Diagnosis Date  . Anemia of chronic renal failure    DIALYSIS m/w/f  . Diabetic neuropathy ([place])   . Diabetic retinopathy ([place])   . Diastolic CHF ([place])   . ESRD (end stage renal disease) ([place])   . GERD (gastroesophageal reflux disease)   . Hyperparathyroidism, secondary renal ([place])   . Hypertension   . Hypothyroidism   . Insulin dependent diabetes mellitus ([place])   . Neuropathy    feet and fingers  . Osteoarthritis    bilateral shoulders  . Peripheral vascular disease ([place])   . Primary hypertension   . Shingles outbreak 04/2017    Past Surgical History:  Procedure Laterality Date  . A/V FISTULAGRAM Left 08/19/2017   Procedure: A/V Fistulagram;  Surgeon: Algernon Huxley, MD;   Location: [facility];  Service: Cardiovascular;  Laterality: Left;  . A/V FISTULAGRAM N/A 03/10/2018   Procedure: A/V FISTULAGRAM;  Surgeon: Algernon Huxley, MD;  Location: [facility];  Service: Cardiovascular;  Laterality: N/A;  . A/V FISTULAGRAM Left 08/24/2018   Procedure: A/V FISTULAGRAM;  Surgeon: Algernon Huxley, MD;  Location: [facility];  Service: Cardiovascular;  Laterality: Left;  . A/V FISTULAGRAM Left 02/02/2019   Procedure: A/V FISTULAGRAM;  Surgeon: Algernon Huxley, MD;  Location: [facility];  Service: Cardiovascular;  Laterality: Left;  . ABDOMINAL HYSTERECTOMY    . ARTHRODESIS METATARSALPHALANGEAL JOINT (MTPJ) Right 04/14/2018   Procedure: ARTHRODESIS; HALLUX/IP JOINT;  Surgeon: Albertine Patricia, DPM;  Location: [facility];  Service: Podiatry;  Laterality: Right;  . BASCILIC VEIN TRANSPOSITION Left 03/04/2016   Procedure: BASCILIC VEIN TRANSPOSITION ( STAGE 1 );  Surgeon: Algernon Huxley, MD;  Location: [facility];  Service: Vascular;  Laterality: Left;  . CATARACT EXTRACTION W/PHACO Right 10/07/2017   Procedure: CATARACT EXTRACTION PHACO AND INTRAOCULAR LENS PLACEMENT (IOC)-RIGHT DIABETIC;  Surgeon: Eulogio Bear, MD;  Location: [facility];  Service: Ophthalmology;  Laterality: Right;  [place] 00:30.8 AP% 8.9 CDE 2.75 FLUID PACK LOT # [id number]  . CATARACT EXTRACTION W/PHACO Left 11/04/2017   Procedure: CATARACT EXTRACTION PHACO AND INTRAOCULAR LENS PLACEMENT (IOC);  Surgeon: Eulogio Bear, MD;  Location: [facility];  Service: Ophthalmology;  Laterality: Left;  [place] 00:30.0 AP% 8.0 CDE 2.41 Fluid Pack lot #  [id number]  . CHOLECYSTECTOMY    . METATARSAL HEAD EXCISION Right 04/14/2018   Procedure: METATARSAL HEAD EXCISION-1ST, 2ND & 5TH MET HEADS;  Surgeon: Albertine Patricia, DPM;  Location: [facility];  Service: Podiatry;  Laterality: Right;  . PERIPHERAL VASCULAR CATHETERIZATION N/A 01/22/2016   Procedure: Dialysis/Perma Catheter Insertion;  Surgeon: Katha Cabal, MD;  Location: [facility];  Service: Cardiovascular;  Laterality: N/A;  . PERIPHERAL VASCULAR CATHETERIZATION Left 05/14/2016   Procedure: A/V Shuntogram/Fistulagram;  Surgeon: Algernon Huxley, MD;  Location: [place] [facility];  Service: Cardiovascular;  Laterality: Left;  . PERIPHERAL VASCULAR CATHETERIZATION N/A 05/14/2016   Procedure: A/V Shunt Intervention;  Surgeon: Algernon Huxley, MD;  Location: [facility];  Service: Cardiovascular;  Laterality: N/A;  . PERIPHERAL VASCULAR CATHETERIZATION N/A 06/25/2016   Procedure: Dialysis/Perma Catheter Removal;  Surgeon: Algernon Huxley, MD;  Location: [facility];  Service: Cardiovascular;  Laterality: N/A;  . PERIPHERAL VASCULAR CATHETERIZATION Left 08/03/2016   Procedure: A/V Shuntogram/Fistulagram;  Surgeon: Algernon Huxley, MD;  Location: [facility];  Service: Cardiovascular;  Laterality: Left;  . PERIPHERAL VASCULAR CATHETERIZATION N/A 08/03/2016   Procedure: A/V Shunt Intervention;  Surgeon: Algernon Huxley, MD;  Location: [facility];  Service: Cardiovascular;  Laterality: N/A;  . PERIPHERAL VASCULAR CATHETERIZATION Left 08/07/2016   Procedure: A/V Shuntogram/Fistulagram;  Surgeon: Katha Cabal, MD;  Location: [facility];  Service: Cardiovascular;  Laterality: Left;  . PERIPHERAL VASCULAR CATHETERIZATION Left 09/22/2016   Procedure: A/V Fistulagram;  Surgeon: Katha Cabal, MD;  Location: [facility];  Service: Cardiovascular;  Laterality: Left;  . THYROIDECTOMY      Social History   Socioeconomic History  . Marital status: Single    Spouse name: Not on file  . Number of children: Not on file  . Years of education: Not on file  . Highest education level: Not on file  Occupational History  . Not on file  Social Needs  . Financial resource strain: Not on file  . Food insecurity:    Worry: Not on file    Inability: Not on file  . Transportation needs:    Medical: Not on  file    Non-medical: Not on file  Tobacco Use  . Smoking status: Never Smoker  . Smokeless tobacco: Never Used  Substance and Sexual Activity  . Alcohol use: No    Alcohol/week: 0.0 standard drinks  . Drug use: No  . Sexual activity: Not Currently  Lifestyle  . Physical activity:    Days per week: Not on file    Minutes per session: Not on file  . Stress: Not on file  Relationships  . Social connections:    Talks on phone: Not on file    Gets together: Not on file    Attends religious service: Not on file    Active member of club or organization: Not on file    Attends meetings of clubs or organizations: Not on file    Relationship status: Not on file  . Intimate partner violence:    Fear of current or ex partner: Not on file    Emotionally abused: Not on file    Physically abused: Not on file    Forced sexual activity: Not on file  Other Topics Concern  . Not on file  Social History Narrative   Single lives alone.  She is employed. She has no impairements    Family  History  Problem Relation Age of Onset  . Renal Disease Brother        with renal transplant  . Diabetes Mellitus II Father   . Diabetes Mellitus II Mother   . Diabetes Mellitus II Brother   . Breast cancer Neg Hx     No Known Allergies   Review of Systems   Review of Systems: Negative Unless Checked Constitutional: '[]'$ Weight loss  '[]'$ Fever  '[]'$ Chills Cardiac: '[]'$ Chest pain   '[]'$  Atrial Fibrillation  '[]'$ Palpitations   '[]'$ Shortness of breath when laying flat   '[]'$ Shortness of breath with exertion. '[]'$ Shortness of breath at rest Vascular:  '[]'$ Pain in legs with walking   '[]'$ Pain in legs with standing '[]'$ Pain in legs when laying flat   '[]'$ Claudication    '[]'$ Pain in feet when laying flat    '[]'$ History of DVT   '[]'$ Phlebitis   '[]'$ Swelling in legs   '[]'$ Varicose veins   '[]'$ Non-healing ulcers Pulmonary:   '[]'$ Uses home oxygen   '[]'$ Productive cough   '[]'$ Hemoptysis   '[]'$ Wheeze  '[]'$ COPD   '[]'$ Asthma Neurologic:  '[]'$ Dizziness   '[]'$ Seizures   '[]'$ Blackouts '[]'$ History of stroke   '[]'$ History of TIA  '[]'$ Aphasia   '[]'$ Temporary Blindness   '[]'$ Weakness or numbness in arm   '[]'$ Weakness or numbness in leg Musculoskeletal:   '[]'$ Joint swelling   '[]'$ Joint pain   '[]'$ Low back pain  '[]'$  History of Knee Replacement '[]'$ Arthritis '[]'$ back Surgeries  '[]'$  Spinal Stenosis    Hematologic:  '[]'$ Easy bruising  '[]'$ Easy bleeding   '[]'$ Hypercoagulable state   '[x]'$ Anemic Gastrointestinal:  '[]'$ Diarrhea   '[]'$ Vomiting  '[x]'$ Gastroesophageal reflux/heartburn   '[]'$ Difficulty swallowing. '[]'$ Abdominal pain Genitourinary:  '[x]'$ Chronic kidney disease   '[]'$ Difficult urination  '[]'$ Anuric   '[]'$ Blood in urine '[]'$ Frequent urination  '[]'$ Burning with urination   '[]'$ Hematuria Skin:  '[]'$ Rashes   '[]'$ Ulcers '[]'$ Wounds Psychological:  '[]'$ History of anxiety   '[]'$  History of major depression  '[]'$  Memory Difficulties      OBJECTIVE:   Physical Exam  BP (!) 169/67 (BP Location: Right Arm, Patient Position: Sitting, Cuff Size: Large)   Pulse 77   Resp 10   Ht '5\' 6"'$  (1.676 m)   Wt 222 lb (100.7 kg)   BMI 35.83 kg/m   Gen: WD/WN, NAD Head: [place]/AT, No temporalis wasting.  Ear/Nose/Throat: Hearing grossly intact, nares w/o erythema or drainage Eyes: PER, EOMI, sclera nonicteric.  Neck: Supple, no masses.  No JVD.  Pulmonary:  Good air movement, no use of accessory muscles.  Cardiac: RRR Vascular: good thrill and bruit  Vessel Right Left  Radial Palpable Palpable   Gastrointestinal: soft, non-distended. No guarding/no peritoneal signs.  Musculoskeletal: M/S 5/5 throughout.  No deformity or atrophy.  Neurologic: Pain and light touch intact in extremities.  Symmetrical.  Speech is fluent. Motor exam as listed above. Psychiatric: Judgment intact, Mood & affect appropriate for pt's clinical situation. Dermatologic: No Venous rashes. No Ulcers Noted.  No changes consistent with cellulitis. Lymph : No Cervical lymphadenopathy, no lichenification or skin changes of chronic lymphedema.       ASSESSMENT AND PLAN:  1.  ESRD on dialysis [facility]) Recommend:  The patient is doing well and currently has adequate dialysis access. The patient's dialysis center is not reporting any major access issues.  However, the patient reports that she does have some soreness following dialysis, as well as issues with being stuck in certain areas.  This may have to do with the stent placement and may resolve with time.   The patient will follow-up with me in the  office in 6 months without an HDA  - VAS US DUPLEX DIALYSIS ACCESS (AVF, AVG); Future  2. Type 2 diabetes mellitus with chronic kidney disease on chronic dialysis, unspecified whether long term insulin use ([place]) Continue hypoglycemic medications as already ordered, these medications have been reviewed and there are no changes at this time.  Hgb A1C to be monitored as already arranged by primary service   3. Essential hypertension Continue antihypertensive medications as already ordered, these medications have been reviewed and there are no changes at this time.    Current Outpatient Medications on File Prior to Visit  Medication Sig Dispense Refill  . acetaminophen (TYLENOL) 500 MG tablet Take 500 mg by mouth every 8 (eight) hours as needed for mild pain or moderate pain.    [name] atorvastatin (LIPITOR) 40 MG tablet Take 40 mg by mouth at bedtime.    . calcium acetate (PHOSLO) 667 MG capsule Take 1,334 mg by mouth 3 (three) times daily with meals.     . carvedilol (COREG) 6.25 MG tablet Take 12.5 mg by mouth at bedtime.   5  . docusate sodium (COLACE) 50 MG capsule Take 200 mg by mouth 2 (two) times daily.     . fosinopril (MONOPRIL) 40 MG tablet Take 40 mg by mouth daily.     . furosemide (LASIX) 80 MG tablet Take 80 mg by mouth every Tuesday, Thursday, Saturday, and Sunday. In the morning    . Insulin Glargine (LANTUS SOLOSTAR) 100 UNIT/ML Solostar Pen Inject 20 Units into the skin daily at 10 pm.    . levothyroxine (SYNTHROID, LEVOTHROID) 175 MCG tablet Take 175  mcg by mouth daily before breakfast.   3  . lidocaine-prilocaine (EMLA) cream Apply 1 application topically as needed (port access).    . multivitamin (RENA-VIT) TABS tablet Take 1 tablet by mouth daily.    [name] omeprazole (PRILOSEC) 20 MG capsule Take 20 mg by mouth daily.    . polyethylene glycol (MIRALAX / GLYCOLAX) packet Take 17 g by mouth daily. 14 each 0  . clopidogrel (PLAVIX) 75 MG tablet Take 75 mg by mouth daily with breakfast.      No current facility-administered medications on file prior to visit.     There are no Patient Instructions on file for this visit. Return for HDA.   Kris Hartmann, NP  This note was completed with Sales executive.  Any errors are purely unintentional.

## 2019-04-18 ENCOUNTER — Encounter (INDEPENDENT_AMBULATORY_CARE_PROVIDER_SITE_OTHER): Payer: Medicare Other

## 2019-04-18 ENCOUNTER — Ambulatory Visit (INDEPENDENT_AMBULATORY_CARE_PROVIDER_SITE_OTHER): Payer: Medicare Other | Admitting: Nurse Practitioner

## 2019-04-27 ENCOUNTER — Encounter: Payer: Self-pay | Admitting: Gastroenterology

## 2019-04-27 ENCOUNTER — Other Ambulatory Visit: Payer: Self-pay

## 2019-04-27 ENCOUNTER — Ambulatory Visit (INDEPENDENT_AMBULATORY_CARE_PROVIDER_SITE_OTHER): Payer: Medicare Other | Admitting: Gastroenterology

## 2019-04-27 VITALS — BP 118/70 | HR 73 | Temp 98.5°F | Ht 66.0 in | Wt 224.2 lb

## 2019-04-27 DIAGNOSIS — K921 Melena: Secondary | ICD-10-CM

## 2019-04-27 NOTE — Progress Notes (Signed)
Jonathon Bellows MD, MRCP(U.K) [address]  [place]  [place], [place] [address]  Main: [phone]  Fax: [phone]   Gastroenterology Consultation  Referring Provider:     Dewayne Shorter, MD Primary Care Physician:  Baxter Hire, MD Primary Gastroenterologist:  Dr. Jonathon Bellows  Reason for Consultation:     Melena         HPI:   Kendra [name] is a 68 y.o. y/o female referred for consultation & management  by Dr. Edwina Barth, Chrystie Nose, MD.   H/o CKD on Plavix.    Hb was 10.8 2 months back. She says that in April for 2 weeks her stool was black in color. On Monday she was told her Hb was "normal"  Denies any abdominal pain. She takes Plavix and is on dialysis. Her last colonoscopy was 5 years back , she had polyps at that time . She does not wish for another one .   BP 118/70   Pulse 73   Temp 98.5 F (36.9 C)   Ht 5' 6" (1.676 m)   Wt 224 lb 3.2 oz (101.7 kg)   BMI 36.19 kg/m    Past Medical History:  Diagnosis Date  . Anemia of chronic renal failure    DIALYSIS m/w/f  . Diabetic neuropathy ([place])   . Diabetic retinopathy ([place])   . Diastolic CHF ([place])   . ESRD (end stage renal disease) ([place])   . GERD (gastroesophageal reflux disease)   . Hyperparathyroidism, secondary renal ([place])   . Hypertension   . Hypothyroidism   . Insulin dependent diabetes mellitus ([place])   . Neuropathy    feet and fingers  . Osteoarthritis    bilateral shoulders  . Peripheral vascular disease ([place])   . Primary hypertension   . Shingles outbreak 04/2017    Past Surgical History:  Procedure Laterality Date  . A/V FISTULAGRAM Left 08/19/2017   Procedure: A/V Fistulagram;  Surgeon: Algernon Huxley, MD;  Location: [place] [facility];  Service: Cardiovascular;  Laterality: Left;  . A/V FISTULAGRAM N/A 03/10/2018   Procedure: A/V FISTULAGRAM;  Surgeon: Algernon Huxley, MD;  Location: [place] [facility];  Service: Cardiovascular;  Laterality: N/A;  . A/V FISTULAGRAM Left 08/24/2018   Procedure: A/V FISTULAGRAM;  Surgeon: Algernon Huxley, MD;  Location: [facility] [facility];  Service: Cardiovascular;  Laterality: Left;  . A/V FISTULAGRAM Left 02/02/2019   Procedure: A/V FISTULAGRAM;  Surgeon: Algernon Huxley, MD;  Location: [facility] [facility];  Service: Cardiovascular;  Laterality: Left;  . ABDOMINAL HYSTERECTOMY    . ARTHRODESIS METATARSALPHALANGEAL JOINT (MTPJ) Right 04/14/2018   Procedure: ARTHRODESIS; HALLUX/IP JOINT;  Surgeon: Albertine Patricia, DPM;  Location: [facility];  Service: Podiatry;  Laterality: Right;  . BASCILIC VEIN TRANSPOSITION Left 03/04/2016   Procedure: BASCILIC VEIN TRANSPOSITION ( STAGE 1 );  Surgeon: Algernon Huxley, MD;  Location: [facility];  Service: Vascular;  Laterality: Left;  . CATARACT EXTRACTION W/PHACO Right 10/07/2017   Procedure: CATARACT EXTRACTION PHACO AND INTRAOCULAR LENS PLACEMENT (IOC)-RIGHT DIABETIC;  Surgeon: Eulogio Bear, MD;  Location: [facility];  Service: Ophthalmology;  Laterality: Right;  [place] 00:30.8 AP% 8.9 CDE 2.75 FLUID PACK LOT # [id number]  . CATARACT EXTRACTION W/PHACO Left 11/04/2017   Procedure: CATARACT EXTRACTION PHACO AND INTRAOCULAR LENS PLACEMENT (IOC);  Surgeon: Eulogio Bear, MD;  Location: [facility];  Service: Ophthalmology;  Laterality: Left;  [place] 00:30.0 AP% 8.0 CDE 2.41 Fluid Pack lot # [id number]  .  CHOLECYSTECTOMY    . METATARSAL HEAD EXCISION Right 04/14/2018   Procedure: METATARSAL HEAD EXCISION-1ST, 2ND & 5TH MET HEADS;  Surgeon: Albertine Patricia, DPM;  Location: [facility];  Service: Podiatry;  Laterality: Right;  . PERIPHERAL VASCULAR CATHETERIZATION N/A 01/22/2016   Procedure: Dialysis/Perma Catheter Insertion;  Surgeon: Katha Cabal, MD;  Location: [place] [facility];  Service: Cardiovascular;  Laterality: N/A;  . PERIPHERAL VASCULAR CATHETERIZATION Left 05/14/2016   Procedure: A/V Shuntogram/Fistulagram;  Surgeon: Algernon Huxley, MD;  Location: [place] [facility];  Service: Cardiovascular;  Laterality: Left;   . PERIPHERAL VASCULAR CATHETERIZATION N/A 05/14/2016   Procedure: A/V Shunt Intervention;  Surgeon: Algernon Huxley, MD;  Location: [place] [facility];  Service: Cardiovascular;  Laterality: N/A;  . PERIPHERAL VASCULAR CATHETERIZATION N/A 06/25/2016   Procedure: Dialysis/Perma Catheter Removal;  Surgeon: Algernon Huxley, MD;  Location: [facility] [facility];  Service: Cardiovascular;  Laterality: N/A;  . PERIPHERAL VASCULAR CATHETERIZATION Left 08/03/2016   Procedure: A/V Shuntogram/Fistulagram;  Surgeon: Algernon Huxley, MD;  Location: [place] [facility];  Service: Cardiovascular;  Laterality: Left;  . PERIPHERAL VASCULAR CATHETERIZATION N/A 08/03/2016   Procedure: A/V Shunt Intervention;  Surgeon: Algernon Huxley, MD;  Location: [facility] [facility];  Service: Cardiovascular;  Laterality: N/A;  . PERIPHERAL VASCULAR CATHETERIZATION Left 08/07/2016   Procedure: A/V Shuntogram/Fistulagram;  Surgeon: Katha Cabal, MD;  Location: [place] [facility];  Service: Cardiovascular;  Laterality: Left;  . PERIPHERAL VASCULAR CATHETERIZATION Left 09/22/2016   Procedure: A/V Fistulagram;  Surgeon: Katha Cabal, MD;  Location: [place] [facility];  Service: Cardiovascular;  Laterality: Left;  . THYROIDECTOMY      Prior to Admission medications   Medication Sig Start Date End Date Taking? Authorizing Provider  acetaminophen (TYLENOL) 500 MG tablet Take 500 mg by mouth every 8 (eight) hours as needed for mild pain or moderate pain.    [provider]  atorvastatin (LIPITOR) 40 MG tablet Take 40 mg by mouth at bedtime.    [provider]  calcium acetate (PHOSLO) 667 MG capsule Take 1,334 mg by mouth 3 (three) times daily with meals.     [provider]  carvedilol (COREG) 6.25 MG tablet Take 12.5 mg by mouth at bedtime.  07/19/17   [provider]  clopidogrel (PLAVIX) 75 MG tablet Take 75 mg by mouth daily with breakfast.  09/08/16   [provider]  docusate sodium  (COLACE) 50 MG capsule Take 200 mg by mouth 2 (two) times daily.     [provider]  fosinopril (MONOPRIL) 40 MG tablet Take 40 mg by mouth daily.  05/25/18   [provider]  furosemide (LASIX) 80 MG tablet Take 80 mg by mouth every Tuesday, Thursday, Saturday, and Sunday. In the morning    [provider]  Insulin Glargine (LANTUS SOLOSTAR) 100 UNIT/ML Solostar Pen Inject 20 Units into the skin daily at 10 pm.    [provider]  levothyroxine (SYNTHROID, LEVOTHROID) 175 MCG tablet Take 175 mcg by mouth daily before breakfast.     [provider]  lidocaine-prilocaine (EMLA) cream Apply 1 application topically as needed (port access).    [provider]  multivitamin (RENA-VIT) TABS tablet Take 1 tablet by mouth daily.    [provider]  omeprazole (PRILOSEC) 20 MG capsule Take 20 mg by mouth daily.    [provider]  polyethylene glycol (MIRALAX / GLYCOLAX) packet Take 17 g by mouth daily.  05/08/17   Gladstone Lighter, MD    Family History  Problem Relation Age of Onset  . Renal Disease Brother        with renal transplant  . Diabetes Mellitus II Father   . Diabetes Mellitus II Mother   . Diabetes Mellitus II Brother   . Breast cancer Neg Hx      Social History   Tobacco Use  . Smoking status: Never Smoker  . Smokeless tobacco: Never Used  Substance Use Topics  . Alcohol use: No    Alcohol/week: 0.0 standard drinks  . Drug use: No    Allergies as of 04/27/2019  . (No Known Allergies)    Review of Systems:    All systems reviewed and negative except where noted in HPI.   Physical Exam:  There were no vitals taken for this visit. No LMP recorded. Patient has had a hysterectomy. Psych:  Alert and cooperative. Normal mood and affect. General:   Alert,  Well-developed, well-nourished, pleasant and cooperative in NAD Head:  Normocephalic and atraumatic. Eyes:  Sclera clear, no icterus.   Conjunctiva  pink. Ears:  Normal auditory acuity. Nose:  No deformity, discharge, or lesions. Mouth:  No deformity or lesions,oropharynx pink & moist. Neck:  Supple; no masses or thyromegaly. Lungs:  Respirations even and unlabored.  Clear throughout to auscultation.   No wheezes, crackles, or rhonchi. No acute distress. Heart:  Regular rate and rhythm; no murmurs, clicks, rubs, or gallops. Abdomen:  Normal bowel sounds.  No bruits.  Soft, non-tender and non-distended without masses, hepatosplenomegaly or hernias noted.  No guarding or rebound tenderness.    Neurologic:  Alert and oriented x3;  grossly normal neurologically. Skin:  Intact without significant lesions or rashes. No jaundice. Lymph Nodes:  No significant cervical adenopathy. Psych:  Alert and cooperative. Normal mood and affect.  Imaging Studies: No results found.  Assessment and Plan:   ELLISA DEVIVO is a 68 y.o. y/o female has been referred for melena. Appears presently none, on Plavix.   Plan  1. EGD, she will call me right away if she has black stools.   I have discussed alternative options, risks & benefits,  which include, but are not limited to, bleeding, infection, perforation,respiratory complication & drug reaction.  The patient agrees with this plan & written consent will be obtained.    Follow up PRN  Dr Jonathon Bellows MD,MRCP(U.K)

## 2019-04-27 NOTE — Progress Notes (Signed)
Pt states she no longer takes Plavix or any other blood thinners. Pt states the Plavix was discontinued after a previous procedure.

## 2019-05-26 ENCOUNTER — Other Ambulatory Visit
Admission: RE | Admit: 2019-05-26 | Discharge: 2019-05-26 | Disposition: A | Discharge status: Home / Self Care | Payer: Medicare Other | Source: Ambulatory Visit | Attending: Gastroenterology | Admitting: Gastroenterology

## 2019-05-26 ENCOUNTER — Other Ambulatory Visit: Payer: Self-pay

## 2019-05-26 DIAGNOSIS — Z20828 Contact with and (suspected) exposure to other viral communicable diseases: Secondary | ICD-10-CM | POA: Diagnosis not present

## 2019-05-26 DIAGNOSIS — Z01812 Encounter for preprocedural laboratory examination: Secondary | ICD-10-CM | POA: Insufficient documentation

## 2019-05-27 LAB — SARS CORONAVIRUS 2 (TAT 6-24 HRS): SARS Coronavirus 2: NEGATIVE

## 2019-05-30 ENCOUNTER — Ambulatory Visit: Payer: Medicare Other | Admitting: Anesthesiology

## 2019-05-30 ENCOUNTER — Ambulatory Visit
Admission: RE | Admit: 2019-05-30 | Discharge: 2019-05-30 | Disposition: A | Discharge status: Home / Self Care | Payer: Medicare Other | Attending: Gastroenterology | Admitting: Gastroenterology

## 2019-05-30 ENCOUNTER — Encounter
Admission: RE | Disposition: A | Discharge status: Home / Self Care | Payer: Self-pay | Source: Home / Self Care | Attending: Gastroenterology

## 2019-05-30 ENCOUNTER — Other Ambulatory Visit: Payer: Self-pay

## 2019-05-30 DIAGNOSIS — N186 End stage renal disease: Secondary | ICD-10-CM | POA: Insufficient documentation

## 2019-05-30 DIAGNOSIS — K222 Esophageal obstruction: Secondary | ICD-10-CM | POA: Diagnosis not present

## 2019-05-30 DIAGNOSIS — K219 Gastro-esophageal reflux disease without esophagitis: Secondary | ICD-10-CM | POA: Insufficient documentation

## 2019-05-30 DIAGNOSIS — Z79899 Other long term (current) drug therapy: Secondary | ICD-10-CM | POA: Insufficient documentation

## 2019-05-30 DIAGNOSIS — Z833 Family history of diabetes mellitus: Secondary | ICD-10-CM | POA: Diagnosis not present

## 2019-05-30 DIAGNOSIS — Z79890 Hormone replacement therapy (postmenopausal): Secondary | ICD-10-CM | POA: Diagnosis not present

## 2019-05-30 DIAGNOSIS — E1151 Type 2 diabetes mellitus with diabetic peripheral angiopathy without gangrene: Secondary | ICD-10-CM | POA: Insufficient documentation

## 2019-05-30 DIAGNOSIS — E039 Hypothyroidism, unspecified: Secondary | ICD-10-CM | POA: Diagnosis not present

## 2019-05-30 DIAGNOSIS — Z794 Long term (current) use of insulin: Secondary | ICD-10-CM | POA: Insufficient documentation

## 2019-05-30 DIAGNOSIS — N2581 Secondary hyperparathyroidism of renal origin: Secondary | ICD-10-CM | POA: Diagnosis not present

## 2019-05-30 DIAGNOSIS — Z992 Dependence on renal dialysis: Secondary | ICD-10-CM | POA: Insufficient documentation

## 2019-05-30 DIAGNOSIS — M19012 Primary osteoarthritis, left shoulder: Secondary | ICD-10-CM | POA: Insufficient documentation

## 2019-05-30 DIAGNOSIS — E1140 Type 2 diabetes mellitus with diabetic neuropathy, unspecified: Secondary | ICD-10-CM | POA: Diagnosis not present

## 2019-05-30 DIAGNOSIS — I5032 Chronic diastolic (congestive) heart failure: Secondary | ICD-10-CM | POA: Diagnosis not present

## 2019-05-30 DIAGNOSIS — I132 Hypertensive heart and chronic kidney disease with heart failure and with stage 5 chronic kidney disease, or end stage renal disease: Secondary | ICD-10-CM | POA: Diagnosis not present

## 2019-05-30 DIAGNOSIS — D631 Anemia in chronic kidney disease: Secondary | ICD-10-CM | POA: Insufficient documentation

## 2019-05-30 DIAGNOSIS — Z8619 Personal history of other infectious and parasitic diseases: Secondary | ICD-10-CM | POA: Diagnosis not present

## 2019-05-30 DIAGNOSIS — M19011 Primary osteoarthritis, right shoulder: Secondary | ICD-10-CM | POA: Insufficient documentation

## 2019-05-30 DIAGNOSIS — K921 Melena: Secondary | ICD-10-CM | POA: Diagnosis present

## 2019-05-30 DIAGNOSIS — Z7902 Long term (current) use of antithrombotics/antiplatelets: Secondary | ICD-10-CM | POA: Insufficient documentation

## 2019-05-30 DIAGNOSIS — E1122 Type 2 diabetes mellitus with diabetic chronic kidney disease: Secondary | ICD-10-CM | POA: Insufficient documentation

## 2019-05-30 HISTORY — PX: ESOPHAGOGASTRODUODENOSCOPY (EGD) WITH PROPOFOL: SHX5813

## 2019-05-30 LAB — GLUCOSE, CAPILLARY: Glucose-Capillary: 77 mg/dL (ref 70–99)

## 2019-05-30 SURGERY — ESOPHAGOGASTRODUODENOSCOPY (EGD) WITH PROPOFOL
Anesthesia: General

## 2019-05-30 MED ORDER — PROPOFOL 10 MG/ML IV BOLUS
INTRAVENOUS | Status: DC | PRN
  Administered 2019-05-30: 60 mg via INTRAVENOUS

## 2019-05-30 MED ORDER — PROPOFOL 500 MG/50ML IV EMUL
INTRAVENOUS | Status: DC | PRN
  Administered 2019-05-30: 145 ug/kg/min via INTRAVENOUS

## 2019-05-30 MED ORDER — PROPOFOL 500 MG/50ML IV EMUL
INTRAVENOUS | Status: AC
  Filled 2019-05-30: qty 50

## 2019-05-30 MED ORDER — SODIUM CHLORIDE 0.9 % IV SOLN
INTRAVENOUS | Status: DC
  Administered 2019-05-30: 09:00:00 via INTRAVENOUS

## 2019-05-30 NOTE — Transfer of Care (Signed)
Immediate Anesthesia Transfer of Care Note  Patient: Zula J Dreyfuss  Procedure(s) Performed: ESOPHAGOGASTRODUODENOSCOPY (EGD) WITH PROPOFOL (N/A )  Patient Location: PACU  Anesthesia Type:General  Level of Consciousness: awake and alert   Airway & Oxygen Therapy: Patient Spontanous Breathing and Patient connected to nasal cannula oxygen  Post-op Assessment: Report given to RN and Post -op Vital signs reviewed and stable  Post vital signs: Reviewed and stable  Last Vitals:  Vitals Value Taken Time  BP 114/52 05/30/19 0905  Temp 36 C 05/30/19 0904  Pulse 87 05/30/19 0907  Resp 36 05/30/19 0907  SpO2 98 % 05/30/19 0907  Vitals shown include unvalidated device data.  Last Pain:  Vitals:   05/30/19 0904  TempSrc: Tympanic  PainSc:          Complications: No apparent anesthesia complications

## 2019-05-30 NOTE — Op Note (Signed)
[facility] Gastroenterology Patient Name: Kendra [name] Procedure Date: 05/30/2019 8:42 AM MRN: [id number] Account #: [id number] Date of Birth: 12/11/1950 Admit Type: Outpatient Age: 68 Room: [facility] ENDO ROOM 1 Gender: Female Note Status: Finalized Procedure:            Upper GI endoscopy Indications:          Melena Providers:            Jonathon Bellows MD, MD Referring MD:         Andres Labrum, MD (Referring MD) Medicines:            Monitored Anesthesia Care Complications:        No immediate complications. Procedure:            Pre-Anesthesia Assessment:                       - Prior to the procedure, a History and Physical was                        performed, and patient medications, allergies and                        sensitivities were reviewed. The patient's tolerance of                        previous anesthesia was reviewed.                       - The risks and benefits of the procedure and the                        sedation options and risks were discussed with the                        patient. All questions were answered and informed                        consent was obtained.                       - ASA Grade Assessment: III - A patient with severe                        systemic disease.                       After obtaining informed consent, the endoscope was                        passed under direct vision. Throughout the procedure,                        the patient's blood pressure, pulse, and oxygen                        saturations were monitored continuously. The Endoscope                        was introduced through the mouth, and advanced to the  third part of duodenum. The upper GI endoscopy was                        accomplished with ease. The patient tolerated the                        procedure well. Findings:      The examined duodenum was normal.      The stomach was normal.      The cardia and  gastric fundus were normal on retroflexion.      A mild Schatzki ring was found at the gastroesophageal junction. Impression:           - Normal examined duodenum.                       - Normal stomach.                       - Mild Schatzki ring.                       - No specimens collected. Recommendation:       - Discharge patient to home (with escort).                       - Resume previous diet.                       - Continue present medications.                       - Return to my office in 2 months. Procedure Code(s):    --- Professional ---                       [phone], Esophagogastroduodenoscopy, flexible, transoral;                        diagnostic, including collection of specimen(s) by                        brushing or washing, when performed (separate procedure) Diagnosis Code(s):    --- Professional ---                       K22.2, Esophageal obstruction                       K92.1, Melena (includes Hematochezia) CPT copyright 2019 American Medical Association. All rights reserved. The codes documented in this report are preliminary and upon coder review may  be revised to meet current compliance requirements. Jonathon Bellows, MD Jonathon Bellows MD, MD 05/30/2019 9:01:00 AM This report has been signed electronically. Number of Addenda: 0 Note Initiated On: 05/30/2019 8:42 AM Estimated Blood Loss: Estimated blood loss: none.      [facility]

## 2019-05-30 NOTE — Anesthesia Post-op Follow-up Note (Signed)
Anesthesia QCDR form completed.        

## 2019-05-30 NOTE — Anesthesia Preprocedure Evaluation (Signed)
Anesthesia Evaluation  Patient identified by MRN, date of birth, ID band Patient awake    Reviewed: Allergy & Precautions, H&P , NPO status , Patient's Chart, lab work & pertinent test results, reviewed documented beta blocker date and time   Airway Mallampati: II  TM Distance: >3 FB Neck ROM: limited    Dental  (+) Missing, Chipped   Pulmonary neg pulmonary ROS,    Pulmonary exam normal        Cardiovascular hypertension, Pt. on medications and Pt. on home beta blockers + Peripheral Vascular Disease and +CHF  Normal cardiovascular exam     Neuro/Psych negative neurological ROS  negative psych ROS   GI/Hepatic GERD  Controlled,  Endo/Other  diabetesHypothyroidism   Renal/GU Dialysis and ESRFRenal disease  negative genitourinary   Musculoskeletal  (+) Arthritis ,   Abdominal   Peds negative pediatric ROS (+)  Hematology  (+) anemia ,   Anesthesia Other Findings Past Medical History: No date: Anemia of chronic renal failure     Comment:  DIALYSIS m/w/f No date: Diabetic neuropathy (HCC) No date: Diabetic retinopathy (HCC) No date: GERD (gastroesophageal reflux disease) No date: Hyperparathyroidism, secondary renal ([place]) No date: Hypertension No date: Hypothyroidism No date: Insulin dependent diabetes mellitus (HCC) No date: Neuropathy     Comment:  feet and fingers No date: Osteoarthritis     Comment:  bilateral shoulders No date: Peripheral vascular disease ([place]) No date: Primary hypertension 04/2017: Shingles outbreak  Past Surgical History: 08/19/2017: A/V FISTULAGRAM; Left     Comment:  Procedure: A/V Fistulagram;  Surgeon: Algernon Huxley, MD;               Location: [place] [facility];  Service: Cardiovascular;              Laterality: Left; 03/10/2018: A/V FISTULAGRAM; N/A     Comment:  Procedure: A/V FISTULAGRAM;  Surgeon: Algernon Huxley, MD;               Location: [place] [facility];  Service:  Cardiovascular;              Laterality: N/A; No date: ABDOMINAL HYSTERECTOMY 0000000: [place]; Left     Comment:  Procedure: BASCILIC VEIN TRANSPOSITION ( STAGE 1 );                Surgeon: Algernon Huxley, MD;  Location: [facility];  Service:               Vascular;  Laterality: Left; 10/07/2017: CATARACT EXTRACTION W/PHACO; Right     Comment:  Procedure: CATARACT EXTRACTION PHACO AND INTRAOCULAR               LENS PLACEMENT (IOC)-RIGHT DIABETIC;  Surgeon: Eulogio Bear, MD;  Location: [facility];  Service:               Ophthalmology;  Laterality: Right;  [place] 00:30.8 AP%               8.9 CDE 2.75 FLUID PACK LOT # [id number] 11/04/2017: CATARACT EXTRACTION W/PHACO; Left     Comment:  Procedure: CATARACT EXTRACTION PHACO AND INTRAOCULAR               LENS PLACEMENT (IOC);  Surgeon: Eulogio Bear, MD;                Location: [facility];  Service:  Ophthalmology;  Laterality:              Left;  [place] 00:30.0 AP% 8.0 CDE 2.41 Fluid Pack lot #               [id number] No date: CHOLECYSTECTOMY 01/22/2016: PERIPHERAL VASCULAR CATHETERIZATION; N/A     Comment:  Procedure: Dialysis/Perma Catheter Insertion;  Surgeon:               Katha Cabal, MD;  Location: [place] [facility];                Service: Cardiovascular;  Laterality: N/A; 05/14/2016: PERIPHERAL VASCULAR CATHETERIZATION; Left     Comment:  Procedure: A/V Shuntogram/Fistulagram;  Surgeon: Algernon Huxley, MD;  Location: [place] [facility];  Service:               Cardiovascular;  Laterality: Left; 05/14/2016: PERIPHERAL VASCULAR CATHETERIZATION; N/A     Comment:  Procedure: A/V Shunt Intervention;  Surgeon: Algernon Huxley, MD;  Location: [place] [facility];  Service:               Cardiovascular;  Laterality: N/A; 06/25/2016: PERIPHERAL VASCULAR CATHETERIZATION; N/A     Comment:  Procedure: Dialysis/Perma Catheter Removal;  Surgeon:               Algernon Huxley, MD;   Location: [place] [facility];                Service: Cardiovascular;  Laterality: N/A; 08/03/2016: PERIPHERAL VASCULAR CATHETERIZATION; Left     Comment:  Procedure: A/V Shuntogram/Fistulagram;  Surgeon: Algernon Huxley, MD;  Location: [place] [facility];  Service:               Cardiovascular;  Laterality: Left; 08/03/2016: PERIPHERAL VASCULAR CATHETERIZATION; N/A     Comment:  Procedure: A/V Shunt Intervention;  Surgeon: Algernon Huxley, MD;  Location: [place] [facility];  Service:               Cardiovascular;  Laterality: N/A; 08/07/2016: PERIPHERAL VASCULAR CATHETERIZATION; Left     Comment:  Procedure: A/V Shuntogram/Fistulagram;  Surgeon: Katha Cabal, MD;  Location: [place] [facility];  Service:              Cardiovascular;  Laterality: Left; 09/22/2016: PERIPHERAL VASCULAR CATHETERIZATION; Left     Comment:  Procedure: A/V Fistulagram;  Surgeon: Katha Cabal,              MD;  Location: [place] [facility];  Service:               Cardiovascular;  Laterality: Left; No date: THYROIDECTOMY  BMI    Body Mass Index:  35.67 kg/m      Reproductive/Obstetrics                             Anesthesia Physical  Anesthesia Plan  ASA: III  Anesthesia Plan: General   Post-op Pain Management:    Induction:   PONV Risk Score  and Plan: 3 and Treatment may vary due to age or medical condition, TIVA and Ondansetron  Airway Management Planned: Nasal Cannula  Additional Equipment:   Intra-op Plan:   Post-operative Plan:   Informed Consent: I have reviewed the patients History and Physical, chart, labs and discussed the procedure including the risks, benefits and alternatives for the proposed anesthesia with the patient or authorized representative who has indicated his/her understanding and acceptance.     Dental Advisory Given  Plan Discussed with: CRNA  Anesthesia Plan Comments:          Anesthesia Quick Evaluation

## 2019-05-30 NOTE — H&P (Signed)
Jonathon Bellows, MD [address], [place], [place], [place], [address] [address] [place], [place], [place], [address] Phone: [phone]  Fax: [phone]  Primary Care Physician:  Baxter Hire, MD   Pre-Procedure History & Physical: HPI:  Kendra [name] is a 68 y.o. female is here for an endoscopy    Past Medical History:  Diagnosis Date  . Anemia of chronic renal failure    DIALYSIS m/w/f  . Diabetic neuropathy ([place])   . Diabetic retinopathy ([place])   . Diastolic CHF ([place])   . ESRD (end stage renal disease) ([place])   . GERD (gastroesophageal reflux disease)   . Hyperparathyroidism, secondary renal ([place])   . Hypertension   . Hypothyroidism   . Insulin dependent diabetes mellitus ([place])   . Neuropathy    feet and fingers  . Osteoarthritis    bilateral shoulders  . Peripheral vascular disease ([place])   . Primary hypertension   . Shingles outbreak 04/2017    Past Surgical History:  Procedure Laterality Date  . A/V FISTULAGRAM Left 08/19/2017   Procedure: A/V Fistulagram;  Surgeon: Algernon Huxley, MD;  Location: [place] [facility];  Service: Cardiovascular;  Laterality: Left;  . A/V FISTULAGRAM N/A 03/10/2018   Procedure: A/V FISTULAGRAM;  Surgeon: Algernon Huxley, MD;  Location: [place] [facility];  Service: Cardiovascular;  Laterality: N/A;  . A/V FISTULAGRAM Left 08/24/2018   Procedure: A/V FISTULAGRAM;  Surgeon: Algernon Huxley, MD;  Location: [place] [facility];  Service: Cardiovascular;  Laterality: Left;  . A/V FISTULAGRAM Left 02/02/2019   Procedure: A/V FISTULAGRAM;  Surgeon: Algernon Huxley, MD;  Location: [place] [facility];  Service: Cardiovascular;  Laterality: Left;  . ABDOMINAL HYSTERECTOMY    . ARTHRODESIS METATARSALPHALANGEAL JOINT (MTPJ) Right 04/14/2018   Procedure: ARTHRODESIS; HALLUX/IP JOINT;  Surgeon: Albertine Patricia, DPM;  Location: [facility];  Service: Podiatry;  Laterality: Right;  . BASCILIC VEIN TRANSPOSITION Left 03/04/2016   Procedure:  BASCILIC VEIN TRANSPOSITION ( STAGE 1 );  Surgeon: Algernon Huxley, MD;  Location: [facility];  Service: Vascular;  Laterality: Left;  . CATARACT EXTRACTION W/PHACO Right 10/07/2017   Procedure: CATARACT EXTRACTION PHACO AND INTRAOCULAR LENS PLACEMENT (IOC)-RIGHT DIABETIC;  Surgeon: Eulogio Bear, MD;  Location: [facility];  Service: Ophthalmology;  Laterality: Right;  [place] 00:30.8 AP% 8.9 CDE 2.75 FLUID PACK LOT # [id number]  . CATARACT EXTRACTION W/PHACO Left 11/04/2017   Procedure: CATARACT EXTRACTION PHACO AND INTRAOCULAR LENS PLACEMENT (IOC);  Surgeon: Eulogio Bear, MD;  Location: [facility];  Service: Ophthalmology;  Laterality: Left;  [place] 00:30.0 AP% 8.0 CDE 2.41 Fluid Pack lot # [id number]  . CHOLECYSTECTOMY    . METATARSAL HEAD EXCISION Right 04/14/2018   Procedure: METATARSAL HEAD EXCISION-1ST, 2ND & 5TH MET HEADS;  Surgeon: Albertine Patricia, DPM;  Location: [facility];  Service: Podiatry;  Laterality: Right;  . PERIPHERAL VASCULAR CATHETERIZATION N/A 01/22/2016   Procedure: Dialysis/Perma Catheter Insertion;  Surgeon: Katha Cabal, MD;  Location: [place] [facility];  Service: Cardiovascular;  Laterality: N/A;  . PERIPHERAL VASCULAR CATHETERIZATION Left 05/14/2016   Procedure: A/V Shuntogram/Fistulagram;  Surgeon: Algernon Huxley, MD;  Location: [place] [facility];  Service: Cardiovascular;  Laterality: Left;  . PERIPHERAL VASCULAR CATHETERIZATION N/A 05/14/2016   Procedure: A/V Shunt Intervention;  Surgeon: Algernon Huxley, MD;  Location: [place] [facility];  Service: Cardiovascular;  Laterality: N/A;  . PERIPHERAL VASCULAR CATHETERIZATION N/A 06/25/2016   Procedure: Dialysis/Perma  Catheter Removal;  Surgeon: Algernon Huxley, MD;  Location: [place] [facility];  Service: Cardiovascular;  Laterality: N/A;  . PERIPHERAL VASCULAR CATHETERIZATION Left 08/03/2016   Procedure: A/V Shuntogram/Fistulagram;  Surgeon: Algernon Huxley, MD;  Location: [place] [facility];  Service: Cardiovascular;  Laterality:  Left;  . PERIPHERAL VASCULAR CATHETERIZATION N/A 08/03/2016   Procedure: A/V Shunt Intervention;  Surgeon: Algernon Huxley, MD;  Location: [place] [facility];  Service: Cardiovascular;  Laterality: N/A;  . PERIPHERAL VASCULAR CATHETERIZATION Left 08/07/2016   Procedure: A/V Shuntogram/Fistulagram;  Surgeon: Katha Cabal, MD;  Location: [place] [facility];  Service: Cardiovascular;  Laterality: Left;  . PERIPHERAL VASCULAR CATHETERIZATION Left 09/22/2016   Procedure: A/V Fistulagram;  Surgeon: Katha Cabal, MD;  Location: [place] [facility];  Service: Cardiovascular;  Laterality: Left;  . THYROIDECTOMY      Prior to Admission medications   Medication Sig Start Date End Date Taking? Authorizing Provider  acetaminophen (TYLENOL) 500 MG tablet Take 500 mg by mouth every 8 (eight) hours as needed for mild pain or moderate pain.   Yes [provider]  amLODipine (NORVASC) 10 MG tablet every evening. 04/10/19  Yes [provider]  atorvastatin (LIPITOR) 40 MG tablet Take 40 mg by mouth at bedtime.   Yes [provider]  calcium acetate (PHOSLO) 667 MG capsule Take 1,334 mg by mouth 3 (three) times daily with meals.    Yes [provider]  carvedilol (COREG) 6.25 MG tablet Take 12.5 mg by mouth at bedtime.  07/19/17  Yes [provider]  docusate sodium (COLACE) 50 MG capsule Take 200 mg by mouth 2 (two) times daily.    Yes [provider]  fosinopril (MONOPRIL) 40 MG tablet Take 40 mg by mouth daily.  05/25/18  Yes [provider]  furosemide (LASIX) 80 MG tablet Take 80 mg by mouth every Tuesday, Thursday, Saturday, and Sunday. In the morning   Yes [provider]  Insulin Glargine (LANTUS SOLOSTAR) 100 UNIT/ML Solostar Pen Inject 20 Units into the skin daily at 10 pm.   Yes [provider]  levothyroxine (SYNTHROID, LEVOTHROID) 175 MCG tablet Take 175 mcg by mouth daily before breakfast.    Yes [provider]  multivitamin (RENA-VIT) TABS tablet Take 1 tablet by mouth daily.   Yes [provider]  omeprazole (PRILOSEC) 20 MG capsule Take 20 mg by mouth daily.   Yes [provider]  clopidogrel (PLAVIX) 75 MG tablet Take 75 mg by mouth daily with breakfast.  09/08/16   [provider]  lidocaine-prilocaine (EMLA) cream Apply 1 application topically as needed (port access).    [provider]  polyethylene glycol (MIRALAX / GLYCOLAX) packet Take 17 g by mouth daily. 05/08/17   Gladstone Lighter, MD    Allergies as of 04/27/2019  . (No Known Allergies)    Family History  Problem Relation Age of Onset  . Renal Disease Brother        with renal transplant  . Diabetes Mellitus II Father   . Diabetes Mellitus II Mother   . Diabetes Mellitus II Brother   . Breast cancer Neg Hx     Social History   Socioeconomic History  . Marital status: Single    Spouse name: Not on file  . Number of children: Not on file  . Years of education: Not on file  . Highest education level: Not on file  Occupational History  . Not on  file  Social Needs  . Financial resource strain: Not on file  . Food insecurity    Worry: Not on file    Inability: Not on file  . Transportation needs    Medical: Not on file    Non-medical: Not on file  Tobacco Use  . Smoking status: Never Smoker  . Smokeless tobacco: Never Used  Substance and Sexual Activity  . Alcohol use: No    Alcohol/week: 0.0 standard drinks  . Drug use: No  . Sexual activity: Not Currently  Lifestyle  . Physical activity    Days per week: Not on file    Minutes per session: Not on file  . Stress: Not on file  Relationships  . Social Herbalist on phone: Not on file    Gets together: Not on file    Attends religious service: Not on file    Active member of club or organization: Not on file    Attends meetings of clubs or organizations: Not on file    Relationship status: Not on  file  . Intimate partner violence    Fear of current or ex partner: Not on file    Emotionally abused: Not on file    Physically abused: Not on file    Forced sexual activity: Not on file  Other Topics Concern  . Not on file  Social History Narrative   Single lives alone.  She is employed. She has no impairements    Review of Systems: See HPI, otherwise negative ROS  Physical Exam: BP (!) 156/79   Pulse 67   Temp 98.3 F (36.8 C) (Tympanic)   Resp 18   Ht '5\' 6"'$  (1.676 m)   Wt 99.8 kg   SpO2 99%   BMI 35.51 kg/m  General:   Alert,  pleasant and cooperative in NAD Head:  Normocephalic and atraumatic. Neck:  Supple; no masses or thyromegaly. Lungs:  Clear throughout to auscultation, normal respiratory effort.    Heart:  +S1, +S2, Regular rate and rhythm, No edema. Abdomen:  Soft, nontender and nondistended. Normal bowel sounds, without guarding, and without rebound.   Neurologic:  Alert and  oriented x4;  grossly normal neurologically.  Impression/Plan: Kendra [name] is here for an endoscopy  to be performed for  evaluation of melena     Risks, benefits, limitations, and alternatives regarding endoscopy have been reviewed with the patient.  Questions have been answered.  All parties agreeable.   Jonathon Bellows, MD  05/30/2019, 8:44 AM

## 2019-05-30 NOTE — Anesthesia Postprocedure Evaluation (Signed)
Anesthesia Post Note  Patient: Kendra [name]  Procedure(s) Performed: ESOPHAGOGASTRODUODENOSCOPY (EGD) WITH PROPOFOL (N/A )  Patient location during evaluation: PACU Anesthesia Type: General Level of consciousness: awake and alert and oriented Pain management: pain level controlled Vital Signs Assessment: post-procedure vital signs reviewed and stable Respiratory status: spontaneous breathing Cardiovascular status: blood pressure returned to baseline Anesthetic complications: no     Last Vitals:  Vitals:   05/30/19 0904 05/30/19 0924  BP: (!) 114/52 (!) 159/79  Pulse: 85 78  Resp: 20 (!) 25  Temp: (!) 36 C   SpO2: 96% 98%    Last Pain:  Vitals:   05/30/19 0914  TempSrc:   PainSc: 0-No pain                 Monesha Monreal

## 2019-05-31 ENCOUNTER — Encounter: Payer: Self-pay | Admitting: Gastroenterology

## 2019-05-31 NOTE — Progress Notes (Signed)
Pt states on f/u call that her abdomen is sore. Discomfort is less than yesterday. Urged to call MD if pain is not eased throughout the day.

## 2019-06-22 ENCOUNTER — Ambulatory Visit (INDEPENDENT_AMBULATORY_CARE_PROVIDER_SITE_OTHER): Payer: Medicare Other | Admitting: Gastroenterology

## 2019-06-22 ENCOUNTER — Other Ambulatory Visit: Payer: Self-pay

## 2019-06-22 VITALS — BP 104/66 | HR 80 | Ht 66.0 in | Wt 223.6 lb

## 2019-06-22 DIAGNOSIS — K921 Melena: Secondary | ICD-10-CM | POA: Diagnosis not present

## 2019-06-22 NOTE — Progress Notes (Signed)
Jonathon Bellows MD, MRCP(U.K) [address]  [place]  [place], [place] [address]  Main: [phone]  Fax: [phone]   Primary Care Physician: Baxter Hire, MD  Primary Gastroenterologist:  Dr. Jonathon Bellows   Follow-up for melena  HPI: Kendra [name] is a 68 y.o. female  Summary of history :  She was initially referred and seen on 04/27/2019 2:20 weeks of melena.  She had been on Plavix and on dialysis.  Last colonoscopy was 5 years back and she had polyps at that time.  At the last visit she did not wish for a further colonoscopy.  Interval history 04/27/2019-06/22/2019  05/30/2019: EGD: Mild Schatzki's ring otherwise endoscopy was negative.  No further melena.  No GI symptoms.  Plavix has been permanently stopped.  She is not keen on a colonoscopy.    Current Outpatient Medications  Medication Sig Dispense Refill  . acetaminophen (TYLENOL) 500 MG tablet Take 500 mg by mouth every 8 (eight) hours as needed for mild pain or moderate pain.    [name] amLODipine (NORVASC) 10 MG tablet every evening.    [name] atorvastatin (LIPITOR) 40 MG tablet Take 40 mg by mouth at bedtime.    . calcium acetate (PHOSLO) 667 MG capsule Take 1,334 mg by mouth 3 (three) times daily with meals.     . carvedilol (COREG) 6.25 MG tablet Take 12.5 mg by mouth at bedtime.   5  . clopidogrel (PLAVIX) 75 MG tablet Take 75 mg by mouth daily with breakfast.     . docusate sodium (COLACE) 50 MG capsule Take 200 mg by mouth 2 (two) times daily.     . fosinopril (MONOPRIL) 40 MG tablet Take 40 mg by mouth daily.     . furosemide (LASIX) 80 MG tablet Take 80 mg by mouth every Tuesday, Thursday, Saturday, and Sunday. In the morning    . Insulin Glargine (LANTUS SOLOSTAR) 100 UNIT/ML Solostar Pen Inject 20 Units into the skin daily at 10 pm.    . levothyroxine (SYNTHROID, LEVOTHROID) 175 MCG tablet Take 175 mcg by mouth daily before breakfast.   3  . lidocaine-prilocaine (EMLA) cream Apply 1 application topically as  needed (port access).    . multivitamin (RENA-VIT) TABS tablet Take 1 tablet by mouth daily.    [name] omeprazole (PRILOSEC) 20 MG capsule Take 20 mg by mouth daily.    . polyethylene glycol (MIRALAX / GLYCOLAX) packet Take 17 g by mouth daily. 14 each 0   No current facility-administered medications for this visit.     Allergies as of 06/22/2019  . (No Known Allergies)    ROS:  General: Negative for anorexia, weight loss, fever, chills, fatigue, weakness. ENT: Negative for hoarseness, difficulty swallowing , nasal congestion. CV: Negative for chest pain, angina, palpitations, dyspnea on exertion, peripheral edema.  Respiratory: Negative for dyspnea at rest, dyspnea on exertion, cough, sputum, wheezing.  GI: See history of present illness. GU:  Negative for dysuria, hematuria, urinary incontinence, urinary frequency, nocturnal urination.  Endo: Negative for unusual weight change.    Physical Examination:   BP 104/66   Pulse 80   Ht 5\' 6"  (1.676 m)   Wt 223 lb 9.6 oz (101.4 kg)   BMI 36.09 kg/m   General: Well-nourished, well-developed in no acute distress.  Eyes: No icterus. Conjunctivae pink. Mouth: Oropharyngeal mucosa moist and pink , no lesions erythema or exudate. Lungs: Clear to auscultation bilaterally. Non-labored. Heart: Regular rate and rhythm, no murmurs rubs or gallops.  Abdomen: Bowel sounds are normal, nontender, nondistended, no hepatosplenomegaly or masses, no abdominal bruits or hernia , no rebound or guarding.   Extremities: No lower extremity edema. No clubbing or deformities. Neuro: Alert and oriented x 3.  Grossly intact. Skin: Warm and dry, no jaundice.   Psych: Alert and cooperative, normal mood and affect.   Imaging Studies: No results found.  Assessment and Plan:   Kendra [name] is a 68 y.o. y/o female here to follow-up for melena.  None further since last visit doing well Plavix has been completely stopped.  Plan  1.  She states she has a  CBC checked at every dialysis session.  I will try and obtain the last CBC and if CBC shows hemoglobin back to normal then no further evaluation, if has not returned back to normal would need to consider colonoscopy.   I have discussed alternative options, risks & benefits,  which include, but are not limited to, bleeding, infection, perforation,respiratory complication & drug reaction.  The patient agrees with this plan & written consent will be obtained.   Addendum   Noted that her hemoglobin was over 11 g on 06/12/2019 when checked during dialysis at [place].  Dr Jonathon Bellows  MD,MRCP [facility]) Follow up as needed

## 2019-07-14 ENCOUNTER — Emergency Department
Admission: EM | Admit: 2019-07-14 | Discharge: 2019-07-14 | Disposition: A | Discharge status: Home / Self Care | Payer: Medicare Other | Attending: Emergency Medicine | Admitting: Emergency Medicine

## 2019-07-14 ENCOUNTER — Other Ambulatory Visit: Payer: Self-pay

## 2019-07-14 ENCOUNTER — Emergency Department: Payer: Medicare Other

## 2019-07-14 DIAGNOSIS — E039 Hypothyroidism, unspecified: Secondary | ICD-10-CM | POA: Diagnosis not present

## 2019-07-14 DIAGNOSIS — I120 Hypertensive chronic kidney disease with stage 5 chronic kidney disease or end stage renal disease: Secondary | ICD-10-CM | POA: Insufficient documentation

## 2019-07-14 DIAGNOSIS — N186 End stage renal disease: Secondary | ICD-10-CM | POA: Insufficient documentation

## 2019-07-14 DIAGNOSIS — Z992 Dependence on renal dialysis: Secondary | ICD-10-CM | POA: Diagnosis not present

## 2019-07-14 DIAGNOSIS — M545 Low back pain: Secondary | ICD-10-CM | POA: Insufficient documentation

## 2019-07-14 DIAGNOSIS — G8918 Other acute postprocedural pain: Secondary | ICD-10-CM | POA: Diagnosis not present

## 2019-07-14 DIAGNOSIS — M79602 Pain in left arm: Secondary | ICD-10-CM | POA: Diagnosis present

## 2019-07-14 DIAGNOSIS — E1122 Type 2 diabetes mellitus with diabetic chronic kidney disease: Secondary | ICD-10-CM | POA: Insufficient documentation

## 2019-07-14 LAB — URINALYSIS, COMPLETE (UACMP) WITH MICROSCOPIC
Bilirubin Urine: NEGATIVE
Glucose, UA: 50 mg/dL — AB
Ketones, ur: NEGATIVE mg/dL
Nitrite: NEGATIVE
Protein, ur: 100 mg/dL — AB
Specific Gravity, Urine: 1.01 (ref 1.005–1.030)
pH: 7 (ref 5.0–8.0)

## 2019-07-14 LAB — CBC WITH DIFFERENTIAL/PLATELET
Abs Immature Granulocytes: 0.02 10*3/uL (ref 0.00–0.07)
Basophils Absolute: 0.1 10*3/uL (ref 0.0–0.1)
Basophils Relative: 1 %
Eosinophils Absolute: 0.5 10*3/uL (ref 0.0–0.5)
Eosinophils Relative: 9 %
HCT: 34.9 % — ABNORMAL LOW (ref 36.0–46.0)
Hemoglobin: 11.1 g/dL — ABNORMAL LOW (ref 12.0–15.0)
Immature Granulocytes: 0 %
Lymphocytes Relative: 24 %
Lymphs Abs: 1.4 10*3/uL (ref 0.7–4.0)
MCH: 30.6 pg (ref 26.0–34.0)
MCHC: 31.8 g/dL (ref 30.0–36.0)
MCV: 96.1 fL (ref 80.0–100.0)
Monocytes Absolute: 0.7 10*3/uL (ref 0.1–1.0)
Monocytes Relative: 12 %
Neutro Abs: 3.1 10*3/uL (ref 1.7–7.7)
Neutrophils Relative %: 54 %
Platelets: 209 10*3/uL (ref 150–400)
RBC: 3.63 MIL/uL — ABNORMAL LOW (ref 3.87–5.11)
RDW: 14.5 % (ref 11.5–15.5)
WBC: 5.8 10*3/uL (ref 4.0–10.5)
nRBC: 0 % (ref 0.0–0.2)

## 2019-07-14 LAB — COMPREHENSIVE METABOLIC PANEL
ALT: 41 U/L (ref 0–44)
AST: 28 U/L (ref 15–41)
Albumin: 3.9 g/dL (ref 3.5–5.0)
Alkaline Phosphatase: 67 U/L (ref 38–126)
Anion gap: 13 (ref 5–15)
BUN: 42 mg/dL — ABNORMAL HIGH (ref 8–23)
CO2: 29 mmol/L (ref 22–32)
Calcium: 8.9 mg/dL (ref 8.9–10.3)
Chloride: 98 mmol/L (ref 98–111)
Creatinine, Ser: 7.84 mg/dL — ABNORMAL HIGH (ref 0.44–1.00)
GFR calc Af Amer: 6 mL/min — ABNORMAL LOW (ref >60)
GFR calc non Af Amer: 5 mL/min — ABNORMAL LOW (ref >60)
Glucose, Bld: 142 mg/dL — ABNORMAL HIGH (ref 70–99)
Potassium: 4.7 mmol/L (ref 3.5–5.1)
Sodium: 140 mmol/L (ref 135–145)
Total Bilirubin: 0.7 mg/dL (ref 0.3–1.2)
Total Protein: 7.2 g/dL (ref 6.5–8.1)

## 2019-07-14 MED ORDER — OXYCODONE-ACETAMINOPHEN 5-325 MG PO TABS
2.0000 | ORAL_TABLET | Freq: Once | ORAL | Status: AC
  Administered 2019-07-14: 08:00:00 2 via ORAL
  Filled 2019-07-14: qty 2

## 2019-07-14 MED ORDER — HYDROCODONE-ACETAMINOPHEN 5-325 MG PO TABS: 1.0000 | ORAL_TABLET | Freq: Four times a day (QID) | ORAL | 0 refills | Status: DC | PRN

## 2019-07-14 MED ORDER — ONDANSETRON 4 MG PO TBDP
4.0000 mg | ORAL_TABLET | Freq: Once | ORAL | Status: AC
  Administered 2019-07-14: 4 mg via ORAL

## 2019-07-14 MED ORDER — DOCUSATE SODIUM 100 MG PO CAPS: 100.0000 mg | ORAL_CAPSULE | Freq: Every day | ORAL | 2 refills | Status: AC | PRN

## 2019-07-14 MED ORDER — ONDANSETRON 4 MG PO TBDP
ORAL_TABLET | ORAL | Status: AC
  Filled 2019-07-14: qty 1

## 2019-07-14 NOTE — ED Notes (Signed)
Patient back from US.

## 2019-07-14 NOTE — ED Triage Notes (Signed)
Pt states she had had a fistula of the left arm and dialysis port placement in the past week and had her treatment yesterday, states she is having neck, left arm and back pain since.

## 2019-07-14 NOTE — ED Notes (Signed)
Answered pts call bell. Pts head of bed adjusted per request and remote given per request.

## 2019-07-14 NOTE — ED Provider Notes (Signed)
[facility] Emergency Department Provider Note       Time seen: ----------------------------------------- 7:51 AM on 07/14/2019 -----------------------------------------   I have reviewed the triage vital signs and the nursing notes.  HISTORY   Chief Complaint Post-op Problem    HPI Kendra [name] is a 68 y.o. female with a history of anemia, chronic renal failure, CHF, hypertension, hypothyroidism, diabetes who presents to the ED for pain.  Patient had a fistula in her left arm and dialysis port placement last week.  She reports she had treatment yesterday, states she is having neck, left arm and back pain since the event.  She has had multiple vascular procedures done in the left arm in the past.  She also complains of pain in her low back that she thinks may be related to a kidney infection.  Past Medical History:  Diagnosis Date  . Anemia of chronic renal failure    DIALYSIS m/w/f  . Diabetic neuropathy ([place])   . Diabetic retinopathy ([place])   . Diastolic CHF ([place])   . ESRD (end stage renal disease) ([place])   . GERD (gastroesophageal reflux disease)   . Hyperparathyroidism, secondary renal ([place])   . Hypertension   . Hypothyroidism   . Insulin dependent diabetes mellitus ([place])   . Neuropathy    feet and fingers  . Osteoarthritis    bilateral shoulders  . Peripheral vascular disease ([place])   . Primary hypertension   . Shingles outbreak 04/2017    Patient Active Problem List   Diagnosis Date Noted  . Pulmonary edema 02/02/2019  . Acute on chronic respiratory failure with hypoxia ([place]) 02/02/2019  . Pyelonephritis 05/04/2017  . Complication from renal dialysis device 09/21/2016  . ESRD on dialysis ([place]) 09/21/2016  . BP (high blood pressure) 07/15/2015  . Diabetes mellitus, type 2 ([place]) 07/15/2015  . Absolute anemia 07/15/2015    Past Surgical History:  Procedure Laterality Date  . A/V FISTULAGRAM Left 08/19/2017   Procedure: A/V  Fistulagram;  Surgeon: Algernon Huxley, MD;  Location: [place] [facility];  Service: Cardiovascular;  Laterality: Left;  . A/V FISTULAGRAM N/A 03/10/2018   Procedure: A/V FISTULAGRAM;  Surgeon: Algernon Huxley, MD;  Location: [place] [facility];  Service: Cardiovascular;  Laterality: N/A;  . A/V FISTULAGRAM Left 08/24/2018   Procedure: A/V FISTULAGRAM;  Surgeon: Algernon Huxley, MD;  Location: [place] [facility];  Service: Cardiovascular;  Laterality: Left;  . A/V FISTULAGRAM Left 02/02/2019   Procedure: A/V FISTULAGRAM;  Surgeon: Algernon Huxley, MD;  Location: [place] [facility];  Service: Cardiovascular;  Laterality: Left;  . ABDOMINAL HYSTERECTOMY    . ARTHRODESIS METATARSALPHALANGEAL JOINT (MTPJ) Right 04/14/2018   Procedure: ARTHRODESIS; HALLUX/IP JOINT;  Surgeon: Albertine Patricia, DPM;  Location: [facility];  Service: Podiatry;  Laterality: Right;  . BASCILIC VEIN TRANSPOSITION Left 03/04/2016   Procedure: BASCILIC VEIN TRANSPOSITION ( STAGE 1 );  Surgeon: Algernon Huxley, MD;  Location: [facility];  Service: Vascular;  Laterality: Left;  . CATARACT EXTRACTION W/PHACO Right 10/07/2017   Procedure: CATARACT EXTRACTION PHACO AND INTRAOCULAR LENS PLACEMENT (IOC)-RIGHT DIABETIC;  Surgeon: Eulogio Bear, MD;  Location: [facility];  Service: Ophthalmology;  Laterality: Right;  [place] 00:30.8 AP% 8.9 CDE 2.75 FLUID PACK LOT # [id number]  . CATARACT EXTRACTION W/PHACO Left 11/04/2017   Procedure: CATARACT EXTRACTION PHACO AND INTRAOCULAR LENS PLACEMENT (IOC);  Surgeon: Eulogio Bear, MD;  Location: [facility];  Service: Ophthalmology;  Laterality: Left;  [place] 00:30.0  AP% 8.0 CDE 2.41 Fluid Pack lot # [id number]  . CHOLECYSTECTOMY    . ESOPHAGOGASTRODUODENOSCOPY (EGD) WITH PROPOFOL N/A 05/30/2019   Procedure: ESOPHAGOGASTRODUODENOSCOPY (EGD) WITH PROPOFOL;  Surgeon: Jonathon Bellows, MD;  [facility];  Service: Gastroenterology;  Laterality: N/A;  . METATARSAL HEAD EXCISION Right 04/14/2018   Procedure:  METATARSAL HEAD EXCISION-1ST, 2ND & 5TH MET HEADS;  Surgeon: Albertine Patricia, DPM;  Location: [facility];  Service: Podiatry;  Laterality: Right;  . PERIPHERAL VASCULAR CATHETERIZATION N/A 01/22/2016   Procedure: Dialysis/Perma Catheter Insertion;  Surgeon: Katha Cabal, MD;  Location: [place] [facility];  Service: Cardiovascular;  Laterality: N/A;  . PERIPHERAL VASCULAR CATHETERIZATION Left 05/14/2016   Procedure: A/V Shuntogram/Fistulagram;  Surgeon: Algernon Huxley, MD;  Location: [place] [facility];  Service: Cardiovascular;  Laterality: Left;  . PERIPHERAL VASCULAR CATHETERIZATION N/A 05/14/2016   Procedure: A/V Shunt Intervention;  Surgeon: Algernon Huxley, MD;  Location: [place] [facility];  Service: Cardiovascular;  Laterality: N/A;  . PERIPHERAL VASCULAR CATHETERIZATION N/A 06/25/2016   Procedure: Dialysis/Perma Catheter Removal;  Surgeon: Algernon Huxley, MD;  Location: [place] [facility];  Service: Cardiovascular;  Laterality: N/A;  . PERIPHERAL VASCULAR CATHETERIZATION Left 08/03/2016   Procedure: A/V Shuntogram/Fistulagram;  Surgeon: Algernon Huxley, MD;  Location: [place] [facility];  Service: Cardiovascular;  Laterality: Left;  . PERIPHERAL VASCULAR CATHETERIZATION N/A 08/03/2016   Procedure: A/V Shunt Intervention;  Surgeon: Algernon Huxley, MD;  Location: [place] [facility];  Service: Cardiovascular;  Laterality: N/A;  . PERIPHERAL VASCULAR CATHETERIZATION Left 08/07/2016   Procedure: A/V Shuntogram/Fistulagram;  Surgeon: Katha Cabal, MD;  Location: [place] [facility];  Service: Cardiovascular;  Laterality: Left;  . PERIPHERAL VASCULAR CATHETERIZATION Left 09/22/2016   Procedure: A/V Fistulagram;  Surgeon: Katha Cabal, MD;  Location: [place] [facility];  Service: Cardiovascular;  Laterality: Left;  . THYROIDECTOMY      Allergies Patient has no known allergies.  Social History Social History   Tobacco Use  . Smoking status: Never Smoker  . Smokeless tobacco: Never  Used  Substance Use Topics  . Alcohol use: No    Alcohol/week: 0.0 standard drinks  . Drug use: No    Review of Systems Constitutional: Negative for fever. Cardiovascular: Negative for chest pain. Respiratory: Negative for shortness of breath. Gastrointestinal: Negative for abdominal pain, vomiting and diarrhea. Musculoskeletal: Positive for low back pain, left upper extremity pain Skin: Positive for left upper extremity bruising Neurological: Negative for headaches, focal weakness or numbness.  All systems negative/normal/unremarkable except as stated in the HPI  ____________________________________________   PHYSICAL EXAM:  VITAL SIGNS: ED Triage Vitals [07/14/19 0748]  Enc Vitals Group     BP      Pulse      Resp      Temp      Temp src      SpO2      Weight 225 lb (102.1 kg)     Height '5\' 6"'$  (1.676 m)     Head Circumference      Peak Flow      Pain Score 10     Pain Loc      Pain Edu?      Excl. in [place]?    Constitutional: Alert and oriented. Well appearing and in no distress. Eyes: Conjunctivae are normal. Normal extraocular movements. ENT      Head: Normocephalic and atraumatic.      Nose: No congestion/rhinnorhea.  Mouth/Throat: Mucous membranes are moist.      Neck: No stridor.  No obvious swelling in the neck Cardiovascular: Systolic murmur, no rubs or gallops Respiratory: Normal respiratory effort without tachypnea nor retractions. Breath sounds are clear and equal bilaterally. No wheezes/rales/rhonchi. Gastrointestinal: Soft and nontender. Normal bowel sounds Musculoskeletal: Tenderness and around the left AV fistula site with bruising.  Audible bruit, less palpable thrill proximally but good thrill distally.  Good radial pulse in the left arm.  No obvious swelling in the shoulder Neurologic:  Normal speech and language. No gross focal neurologic deficits are appreciated.  Skin: Extensive bruising and around the left AV fistula with recent incision  visible Psychiatric: Mood and affect are normal. Speech and behavior are normal.  ____________________________________________  ED COURSE:  As part of my medical decision making, I reviewed the following data within the [place] History obtained from family if available, nursing notes, old chart and ekg, as well as notes from prior ED visits. Patient presented for concerns about her left AV fistula and some low back pain, we will assess with labs and imaging as indicated at this time.   Procedures  Kendra [name] was evaluated in Emergency Department on 07/14/2019 for the symptoms described in the history of present illness. She was evaluated in the context of the global COVID-19 pandemic, which necessitated consideration that the patient might be at risk for infection with the SARS-CoV-2 virus that causes COVID-19. Institutional protocols and algorithms that pertain to the evaluation of patients at risk for COVID-19 are in a state of rapid change based on information released by regulatory bodies including the CDC and federal and state organizations. These policies and algorithms were followed during the patient's care in the ED.  ____________________________________________   LABS (pertinent positives/negatives)  Labs Reviewed  CBC WITH DIFFERENTIAL/PLATELET - Abnormal; Notable for the following components:      Result Value   RBC 3.63 (*)    Hemoglobin 11.1 (*)    HCT 34.9 (*)    All other components within normal limits  COMPREHENSIVE METABOLIC PANEL - Abnormal; Notable for the following components:   Glucose, Bld 142 (*)    BUN 42 (*)    Creatinine, Ser 7.84 (*)    GFR calc non Af Amer 5 (*)    GFR calc Af Amer 6 (*)    All other components within normal limits  URINALYSIS, COMPLETE (UACMP) WITH MICROSCOPIC - Abnormal; Notable for the following components:   Color, Urine YELLOW (*)    APPearance CLOUDY (*)    Glucose, UA 50 (*)    Hgb urine dipstick SMALL  (*)    Protein, ur 100 (*)    Leukocytes,Ua MODERATE (*)    Bacteria, UA RARE (*)    All other components within normal limits  URINE CULTURE    RADIOLOGY Images were viewed by me  Left upper extremity ultrasound  IMPRESSION:  No evidence of DVT within the left upper extremity.  ____________________________________________   DIFFERENTIAL DIAGNOSIS   Postprocedural pain, contusion, DVT, fistula dysfunction, strain, infection  FINAL ASSESSMENT AND PLAN  Left arm pain, contusion   Plan: The patient had presented for concerns about her left AV fistula and some back pain that she has had. Patient's labs did not reveal any acute process. Patient's imaging was overall reassuring.  She is tender in and around the left AV fistula site but it appears to be working normally.  I do not see any signs of  infection.  I will contact nephrology and advise close outpatient follow-up.   Laurence Aly, MD    Note: This note was generated in part or whole with voice recognition software. Voice recognition is usually quite accurate but there are transcription errors that can and very often do occur. I apologize for any typographical errors that were not detected and corrected.     Earleen Newport, MD 07/14/19 [phone]

## 2019-07-15 LAB — URINE CULTURE: Culture: 70000 — AB

## 2019-08-13 ENCOUNTER — Emergency Department: Payer: Medicare Other

## 2019-08-13 ENCOUNTER — Observation Stay
Admission: EM | Admit: 2019-08-13 | Discharge: 2019-08-14 | Disposition: A | Discharge status: Home / Self Care | Payer: Medicare Other | Attending: Internal Medicine | Admitting: Internal Medicine

## 2019-08-13 ENCOUNTER — Other Ambulatory Visit: Payer: Self-pay

## 2019-08-13 DIAGNOSIS — Z79899 Other long term (current) drug therapy: Secondary | ICD-10-CM | POA: Diagnosis not present

## 2019-08-13 DIAGNOSIS — Z833 Family history of diabetes mellitus: Secondary | ICD-10-CM | POA: Diagnosis not present

## 2019-08-13 DIAGNOSIS — Z20828 Contact with and (suspected) exposure to other viral communicable diseases: Secondary | ICD-10-CM | POA: Insufficient documentation

## 2019-08-13 DIAGNOSIS — M19011 Primary osteoarthritis, right shoulder: Secondary | ICD-10-CM | POA: Insufficient documentation

## 2019-08-13 DIAGNOSIS — J810 Acute pulmonary edema: Secondary | ICD-10-CM | POA: Diagnosis present

## 2019-08-13 DIAGNOSIS — D631 Anemia in chronic kidney disease: Secondary | ICD-10-CM | POA: Insufficient documentation

## 2019-08-13 DIAGNOSIS — Z992 Dependence on renal dialysis: Secondary | ICD-10-CM | POA: Diagnosis not present

## 2019-08-13 DIAGNOSIS — Z841 Family history of disorders of kidney and ureter: Secondary | ICD-10-CM | POA: Diagnosis not present

## 2019-08-13 DIAGNOSIS — Z794 Long term (current) use of insulin: Secondary | ICD-10-CM | POA: Diagnosis not present

## 2019-08-13 DIAGNOSIS — N186 End stage renal disease: Secondary | ICD-10-CM | POA: Diagnosis present

## 2019-08-13 DIAGNOSIS — K219 Gastro-esophageal reflux disease without esophagitis: Secondary | ICD-10-CM | POA: Insufficient documentation

## 2019-08-13 DIAGNOSIS — J9601 Acute respiratory failure with hypoxia: Principal | ICD-10-CM | POA: Insufficient documentation

## 2019-08-13 DIAGNOSIS — E1151 Type 2 diabetes mellitus with diabetic peripheral angiopathy without gangrene: Secondary | ICD-10-CM | POA: Insufficient documentation

## 2019-08-13 DIAGNOSIS — N2581 Secondary hyperparathyroidism of renal origin: Secondary | ICD-10-CM | POA: Diagnosis not present

## 2019-08-13 DIAGNOSIS — I5032 Chronic diastolic (congestive) heart failure: Secondary | ICD-10-CM | POA: Insufficient documentation

## 2019-08-13 DIAGNOSIS — E1140 Type 2 diabetes mellitus with diabetic neuropathy, unspecified: Secondary | ICD-10-CM | POA: Diagnosis not present

## 2019-08-13 DIAGNOSIS — R0602 Shortness of breath: Secondary | ICD-10-CM | POA: Diagnosis present

## 2019-08-13 DIAGNOSIS — R0603 Acute respiratory distress: Secondary | ICD-10-CM | POA: Diagnosis present

## 2019-08-13 DIAGNOSIS — Z7902 Long term (current) use of antithrombotics/antiplatelets: Secondary | ICD-10-CM | POA: Diagnosis not present

## 2019-08-13 DIAGNOSIS — I132 Hypertensive heart and chronic kidney disease with heart failure and with stage 5 chronic kidney disease, or end stage renal disease: Secondary | ICD-10-CM | POA: Diagnosis not present

## 2019-08-13 DIAGNOSIS — I160 Hypertensive urgency: Secondary | ICD-10-CM | POA: Diagnosis not present

## 2019-08-13 DIAGNOSIS — I2510 Atherosclerotic heart disease of native coronary artery without angina pectoris: Secondary | ICD-10-CM | POA: Diagnosis not present

## 2019-08-13 DIAGNOSIS — Z79890 Hormone replacement therapy (postmenopausal): Secondary | ICD-10-CM | POA: Diagnosis not present

## 2019-08-13 DIAGNOSIS — M19012 Primary osteoarthritis, left shoulder: Secondary | ICD-10-CM | POA: Diagnosis not present

## 2019-08-13 DIAGNOSIS — E1122 Type 2 diabetes mellitus with diabetic chronic kidney disease: Secondary | ICD-10-CM | POA: Diagnosis not present

## 2019-08-13 DIAGNOSIS — E039 Hypothyroidism, unspecified: Secondary | ICD-10-CM | POA: Diagnosis not present

## 2019-08-13 DIAGNOSIS — E11319 Type 2 diabetes mellitus with unspecified diabetic retinopathy without macular edema: Secondary | ICD-10-CM | POA: Insufficient documentation

## 2019-08-13 DIAGNOSIS — E8770 Fluid overload, unspecified: Secondary | ICD-10-CM | POA: Diagnosis present

## 2019-08-13 LAB — TROPONIN I (HIGH SENSITIVITY): Troponin I (High Sensitivity): 37 ng/L — ABNORMAL HIGH (ref <18)

## 2019-08-13 LAB — BASIC METABOLIC PANEL
Anion gap: 15 (ref 5–15)
BUN: 64 mg/dL — ABNORMAL HIGH (ref 8–23)
CO2: 22 mmol/L (ref 22–32)
Calcium: 8.7 mg/dL — ABNORMAL LOW (ref 8.9–10.3)
Chloride: 104 mmol/L (ref 98–111)
Creatinine, Ser: 10.89 mg/dL — ABNORMAL HIGH (ref 0.44–1.00)
GFR calc Af Amer: 4 mL/min — ABNORMAL LOW (ref >60)
GFR calc non Af Amer: 3 mL/min — ABNORMAL LOW (ref >60)
Glucose, Bld: 373 mg/dL — ABNORMAL HIGH (ref 70–99)
Potassium: 4.6 mmol/L (ref 3.5–5.1)
Sodium: 141 mmol/L (ref 135–145)

## 2019-08-13 LAB — CBC WITH DIFFERENTIAL/PLATELET
Abs Immature Granulocytes: 0.1 10*3/uL — ABNORMAL HIGH (ref 0.00–0.07)
Basophils Absolute: 0.1 10*3/uL (ref 0.0–0.1)
Basophils Relative: 1 %
Eosinophils Absolute: 0.5 10*3/uL (ref 0.0–0.5)
Eosinophils Relative: 5 %
HCT: 38.4 % (ref 36.0–46.0)
Hemoglobin: 11.7 g/dL — ABNORMAL LOW (ref 12.0–15.0)
Immature Granulocytes: 1 %
Lymphocytes Relative: 29 %
Lymphs Abs: 3.1 10*3/uL (ref 0.7–4.0)
MCH: 30.5 pg (ref 26.0–34.0)
MCHC: 30.5 g/dL (ref 30.0–36.0)
MCV: 100 fL (ref 80.0–100.0)
Monocytes Absolute: 1.3 10*3/uL — ABNORMAL HIGH (ref 0.1–1.0)
Monocytes Relative: 12 %
Neutro Abs: 5.7 10*3/uL (ref 1.7–7.7)
Neutrophils Relative %: 52 %
Platelets: 247 10*3/uL (ref 150–400)
RBC: 3.84 MIL/uL — ABNORMAL LOW (ref 3.87–5.11)
RDW: 14.5 % (ref 11.5–15.5)
WBC: 10.8 10*3/uL — ABNORMAL HIGH (ref 4.0–10.5)
nRBC: 0 % (ref 0.0–0.2)

## 2019-08-13 LAB — SARS CORONAVIRUS 2 BY RT PCR (HOSPITAL ORDER, PERFORMED IN ~~LOC~~ HOSPITAL LAB): SARS Coronavirus 2: NEGATIVE

## 2019-08-13 LAB — BLOOD GAS, VENOUS
Acid-base deficit: 5.5 mmol/L — ABNORMAL HIGH (ref 0.0–2.0)
Bicarbonate: 23.2 mmol/L (ref 20.0–28.0)
Delivery systems: POSITIVE
FIO2: 0.6
O2 Saturation: 86.1 %
Patient temperature: 37
RATE: 8 resp/min
pCO2, Ven: 58 mmHg (ref 44.0–60.0)
pH, Ven: 7.21 — ABNORMAL LOW (ref 7.250–7.430)
pO2, Ven: 63 mmHg — ABNORMAL HIGH (ref 32.0–45.0)

## 2019-08-13 LAB — BRAIN NATRIURETIC PEPTIDE: B Natriuretic Peptide: 350 pg/mL — ABNORMAL HIGH (ref 0.0–100.0)

## 2019-08-13 LAB — LACTIC ACID, PLASMA
Lactic Acid, Venous: 2.1 mmol/L (ref 0.5–1.9)
Lactic Acid, Venous: 1.6 mmol/L (ref 0.5–1.9)

## 2019-08-13 MED ORDER — NITROGLYCERIN IN D5W 200-5 MCG/ML-% IV SOLN: 0.0000 ug/min | INTRAVENOUS | Status: DC

## 2019-08-13 MED ORDER — CHLORHEXIDINE GLUCONATE CLOTH 2 % EX PADS
6.0000 | MEDICATED_PAD | Freq: Every day | CUTANEOUS | Status: DC
  Filled 2019-08-13: qty 6

## 2019-08-13 MED ORDER — FUROSEMIDE 10 MG/ML IJ SOLN
INTRAMUSCULAR | Status: AC
  Filled 2019-08-13: qty 4

## 2019-08-13 MED ORDER — FUROSEMIDE 10 MG/ML IJ SOLN
120.0000 mg | Freq: Once | INTRAMUSCULAR | Status: AC
  Administered 2019-08-13: 120 mg via INTRAVENOUS

## 2019-08-13 MED ORDER — FUROSEMIDE 10 MG/ML IJ SOLN
INTRAMUSCULAR | Status: AC
  Filled 2019-08-13: qty 8

## 2019-08-13 MED ORDER — FUROSEMIDE 10 MG/ML IJ SOLN
120.0000 mg | Freq: Once | INTRAVENOUS | Status: DC
  Filled 2019-08-13: qty 12

## 2019-08-13 MED ORDER — NITROGLYCERIN IN D5W 200-5 MCG/ML-% IV SOLN
INTRAVENOUS | Status: AC
  Filled 2019-08-13: qty 250

## 2019-08-13 NOTE — ED Notes (Signed)
Spoke with pt's sister and pt about plan of care.

## 2019-08-13 NOTE — ED Notes (Signed)
Pt still with SOB and increased RR but states she is starting to feel better

## 2019-08-13 NOTE — ED Notes (Signed)
Pt states she does not feel worse. Pt appears more relaxed. Pt still with difficult work of breathing. Family at bedside

## 2019-08-13 NOTE — ED Notes (Signed)
Hold nitro titration per EDP

## 2019-08-13 NOTE — ED Triage Notes (Signed)
Pt with SOB x 2 hours ago. Last dialysis Friday.

## 2019-08-13 NOTE — ED Notes (Signed)
Lab called to draw lactic at 2130

## 2019-08-13 NOTE — ED Notes (Signed)
Per EDP keep sbp <160 with nitro drip

## 2019-08-13 NOTE — Progress Notes (Addendum)
NICHELL CANNIZZO MRN: [id number] DOB/AGE: 04-23-1951 68 y.o. Primary Care Physician:Johnston, Chrystie Nose, MD Admit date: 08/13/2019 Chief Complaint:  Chief Complaint  Patient presents with  . Shortness of Breath   HPI: Patient is a 68 year old freak American female the past medical history of end-stage renal disease, diabetes mellitus, hypertension who came to the ER with chief complaint of shortness of breath.   History of present illness dates back to this afternoon when patient had onset of shortness of breath patient dyspnea was progressively increasing.  Patient was short of breath at rest so she came to the ER  No complaint of chest pain No complaint of fever cough or chills No complaint of recent sick contact No complaint of recent Covid exposure No complaint of frequency urgency dysuria No complaint of hematuria  Upon upon evaluation in the ER patient was found to be in severe respiratory distress and BiPAP was placed.  Patient has chest x-ray done which showed patient had pulmonary edema.  Nephrology was consulted.    Past Medical History:  Diagnosis Date  . Anemia of chronic renal failure    DIALYSIS m/w/f  . Diabetic neuropathy ([place])   . Diabetic retinopathy ([place])   . Diastolic CHF ([place])   . ESRD (end stage renal disease) ([place])   . GERD (gastroesophageal reflux disease)   . Hyperparathyroidism, secondary renal ([place])   . Hypertension   . Hypothyroidism   . Insulin dependent diabetes mellitus ([place])   . Neuropathy    feet and fingers  . Osteoarthritis    bilateral shoulders  . Peripheral vascular disease ([place])   . Primary hypertension   . Shingles outbreak 04/2017        Family History  Problem Relation Age of Onset  . Renal Disease Brother        with renal transplant  . Diabetes Mellitus II Father   . Diabetes Mellitus II Mother   . Diabetes Mellitus II Brother   . Breast cancer Neg Hx     Social History:  reports that she has never smoked. She has  never used smokeless tobacco. She reports that she does not drink alcohol or use drugs.   Allergies: No Known Allergies  (Not in a hospital admission)      [id number] from the symptoms mentioned above,there are no other symptoms referable to all systems reviewed.  Derrill Memo ON 08/14/2019] Chlorhexidine Gluconate Cloth  6 each Topical Q0600  . furosemide      . furosemide          Physical Exam: Vital signs in last 24 hours: Pulse Rate:  [81-112] 85 (10/25 2220) Resp:  [22-42] 31 (10/25 2220) BP: (145-218)/(76-113) 164/95 (10/25 2220) SpO2:  [95 %-100 %] 100 % (10/25 2220) FiO2 (%):  [60 %] 60 % (10/25 1931) Weight:  [102.1 kg] 102.1 kg (10/25 1933) Weight change:     Intake/Output from previous day: No intake/output data recorded. No intake/output data recorded.   Physical Exam: General- pt is awake,alert, oriented to time place and person Resp- moderate acute REsp distress, Rhonchi+ , Bipap in situ CVS- S1S2 regular in rate and rhythm GIT- BS+, soft, NT, ND EXT- trace LE Edema, Cyanosis CNS- CN 2-12 grossly intact. Moving all 4 extremities Psych- normal mood and affect Access- AVF+   Lab Results: CBC Recent Labs    08/13/19 1930  WBC 10.8*  HGB 11.7*  HCT 38.4  PLT 247    BMET Recent Labs    08/13/19 1930  NA 141  K 4.6  CL 104  CO2 22  GLUCOSE 373*  BUN 64*  CREATININE 10.89*  CALCIUM 8.7*    MICRO Recent Results (from the past 240 hour(s))  SARS Coronavirus 2 by RT PCR (hospital order, performed in [facility] hospital lab) Nasopharyngeal Nasopharyngeal Swab     Status: None   Collection Time: 08/13/19  7:30 PM   Specimen: Nasopharyngeal Swab  Result Value Ref Range Status   SARS Coronavirus 2 NEGATIVE NEGATIVE Final    Comment: (NOTE) If result is NEGATIVE SARS-CoV-2 target nucleic acids are NOT DETECTED. The SARS-CoV-2 RNA is generally detectable in upper and lower  respiratory specimens during the acute phase of infection. The  lowest  concentration of SARS-CoV-2 viral copies this assay can detect is 250  copies / mL. A negative result does not preclude SARS-CoV-2 infection  and should not be used as the sole basis for treatment or other  patient management decisions.  A negative result may occur with  improper specimen collection / handling, submission of specimen other  than nasopharyngeal swab, presence of viral mutation(s) within the  areas targeted by this assay, and inadequate number of viral copies  (<250 copies / mL). A negative result must be combined with clinical  observations, patient history, and epidemiological information. If result is POSITIVE SARS-CoV-2 target nucleic acids are DETECTED. The SARS-CoV-2 RNA is generally detectable in upper and lower  respiratory specimens dur ing the acute phase of infection.  Positive  results are indicative of active infection with SARS-CoV-2.  Clinical  correlation with patient history and other diagnostic information is  necessary to determine patient infection status.  Positive results do  not rule out bacterial infection or co-infection with other viruses. If result is PRESUMPTIVE POSTIVE SARS-CoV-2 nucleic acids MAY BE PRESENT.   A presumptive positive result was obtained on the submitted specimen  and confirmed on repeat testing.  While 2019 novel coronavirus  (SARS-CoV-2) nucleic acids may be present in the submitted sample  additional confirmatory testing may be necessary for epidemiological  and / or clinical management purposes  to differentiate between  SARS-CoV-2 and other Sarbecovirus currently known to infect humans.  If clinically indicated additional testing with an alternate test  methodology [phone]) is advised. The SARS-CoV-2 RNA is generally  detectable in upper and lower respiratory sp ecimens during the acute  phase of infection. The expected result is Negative. Fact Sheet for Patients:   StrictlyIdeas.no Fact Sheet for Healthcare Providers: BankingDealers.co.za This test is not yet approved or cleared by the [place] FDA and has been authorized for detection and/or diagnosis of SARS-CoV-2 by FDA under an Emergency Use Authorization (EUA).  This EUA will remain in effect (meaning this test can be used) for the duration of the COVID-19 declaration under Section 564(b)(1) of the Act, 21 U.S.C. section 360bbb-3(b)(1), unless the authorization is terminated or revoked sooner. Performed at [facility], [address] [place], [place] [address]       Lab Results  Component Value Date   PTH 247 (H) 05/05/2017   CALCIUM 8.7 (L) 08/13/2019   PHOS 7.4 (H) 02/03/2019   2d echo     1. The left ventricle has normal systolic function with an ejection fraction of 60-65%. The cavity size was normal. Left ventricular diastolic Doppler parameters are consistent with impaired relaxation. No evidence of left ventricular regional wall  motion abnormalities.    CXR shows Cardiomegaly with vascular congestion and probable mild edema. Findings  may represent fluid overload.   Impression:  Patient is a 68 year old African-American female with a past medical history of end-stage renal disease, hypertension, CHF, anemia, diabetes mellitus who who was admitted to the hospital for emergent dialysis  1)Renal- End-stage renal disease.  Patient is on hemodialysis.  Patient is on Monday Wednesday Friday schedule.  Patient was last dialyzed on Friday.  Patient is coming in today with shortness of breath .  We will ask for emergent dialysis  2)HTN Blood pressure is high.  Dialysis/fluid removal should help.   We will suggest to start patient on her outpatient regimen    3)Anemia of chronic disease  HGb at goal (9--11) We will keep patient on Epogen during dialysis  4) secondary hyperparathyroidism CKD Mineral-Bone  Disorder  Secondary Hyperparathyroidism present Phosphorus not at goal. On calcium-based binders  5) CHF- diastolic Decompensated  Pt coming in with exacerbation Patient is coming in with fluid overload dialysis should help  6) electrolytes Normokalemic NOrmonatremic   7)Acid base Co2 at goal   8) diabetes mellitus Being managed by the primary team    Plan:   Will dialyze patient today We will most likely dialyze patient again in the morning Will use 3K bath Will follow patient closely Educated patient at length to please restrict her salt and fluid intake.  Addendum.  Dialysis access malfunction  Patient  was initiated on renal replacement therapy.  Patient venous and arterial pressures started running high.  RN tried to attempt 3 times to get access.  We were not able to access the AV fistula. Patient oxygen requirements changed.  Patient was on BiPAP at the time of initiation of dialysis .  In the meantime patient responded to the medical treatment and patient oxygen requirement  changed from requiring a BiPAP to only 3 L of oxygen( please see respiratory notes for details) .  Patient is currently clinically stable.  I will follow the patient closely.  There is no need for emergent dialysis at this time . I have dicussed the case at length with hospitalitist /nocturnist on call that  In case patient's oxygen requirement changes., please let me know. I  will plan for temporary line otherwise we will attempt to start patient on dialysis again in the morning .   Quenten Nawaz s Theador Hawthorne 08/13/2019, 10:23 PM

## 2019-08-13 NOTE — ED Notes (Signed)
Lab in room to draw blood cultures 

## 2019-08-13 NOTE — ED Notes (Signed)
ED TO INPATIENT HANDOFF REPORT  ED Nurse Name and Phone #: Gwenette Greet 59  S Name/Age/Gender Kendra [name] 68 y.o. female Room/Bed: ED04A/ED04A  Code Status   Code Status: Prior  Home/SNF/Other Home Patient oriented to: self, place, time and situation Is this baseline? Yes   Triage Complete: Triage complete  Chief Complaint CHF Overload  Triage Note Pt with SOB x 2 hours ago. Last dialysis Friday.    Allergies No Known Allergies  Level of Care/Admitting Diagnosis ED Disposition    ED Disposition Condition [facility] Area: [place] [[id number]]  Level of Care: Med-Surg [16]  Covid Evaluation: Asymptomatic Screening Protocol (No Symptoms)  Diagnosis: Volume overload [623762]  Admitting Physician: Harrie Foreman [[id number]]  Attending Physician: Harrie Foreman [[id number]]  Estimated length of stay: past midnight tomorrow  Certification:: I certify this patient will need inpatient services for at least 2 midnights  PT Class (Do Not Modify): Inpatient [101]  PT Acc Code (Do Not Modify): Private [1]       B Medical/Surgery History Past Medical History:  Diagnosis Date  . Anemia of chronic renal failure    DIALYSIS m/w/f  . Diabetic neuropathy ([place])   . Diabetic retinopathy ([place])   . Diastolic CHF ([place])   . ESRD (end stage renal disease) ([place])   . GERD (gastroesophageal reflux disease)   . Hyperparathyroidism, secondary renal ([place])   . Hypertension   . Hypothyroidism   . Insulin dependent diabetes mellitus ([place])   . Neuropathy    feet and fingers  . Osteoarthritis    bilateral shoulders  . Peripheral vascular disease ([place])   . Primary hypertension   . Shingles outbreak 04/2017   Past Surgical History:  Procedure Laterality Date  . A/V FISTULAGRAM Left 08/19/2017   Procedure: A/V Fistulagram;  Surgeon: Algernon Huxley, MD;  Location: [place] [facility];  Service: Cardiovascular;  Laterality: Left;  . A/V  FISTULAGRAM N/A 03/10/2018   Procedure: A/V FISTULAGRAM;  Surgeon: Algernon Huxley, MD;  Location: [place] [facility];  Service: Cardiovascular;  Laterality: N/A;  . A/V FISTULAGRAM Left 08/24/2018   Procedure: A/V FISTULAGRAM;  Surgeon: Algernon Huxley, MD;  Location: [place] [facility];  Service: Cardiovascular;  Laterality: Left;  . A/V FISTULAGRAM Left 02/02/2019   Procedure: A/V FISTULAGRAM;  Surgeon: Algernon Huxley, MD;  Location: [place] [facility];  Service: Cardiovascular;  Laterality: Left;  . ABDOMINAL HYSTERECTOMY    . ARTHRODESIS METATARSALPHALANGEAL JOINT (MTPJ) Right 04/14/2018   Procedure: ARTHRODESIS; HALLUX/IP JOINT;  Surgeon: Albertine Patricia, DPM;  Location: [facility];  Service: Podiatry;  Laterality: Right;  . BASCILIC VEIN TRANSPOSITION Left 03/04/2016   Procedure: BASCILIC VEIN TRANSPOSITION ( STAGE 1 );  Surgeon: Algernon Huxley, MD;  Location: [facility];  Service: Vascular;  Laterality: Left;  . CATARACT EXTRACTION W/PHACO Right 10/07/2017   Procedure: CATARACT EXTRACTION PHACO AND INTRAOCULAR LENS PLACEMENT (IOC)-RIGHT DIABETIC;  Surgeon: Eulogio Bear, MD;  Location: [facility];  Service: Ophthalmology;  Laterality: Right;  [place] 00:30.8 AP% 8.9 CDE 2.75 FLUID PACK LOT # [id number]  . CATARACT EXTRACTION W/PHACO Left 11/04/2017   Procedure: CATARACT EXTRACTION PHACO AND INTRAOCULAR LENS PLACEMENT (IOC);  Surgeon: Eulogio Bear, MD;  Location: [facility];  Service: Ophthalmology;  Laterality: Left;  [place] 00:30.0 AP% 8.0 CDE 2.41 Fluid Pack lot # [id number]  . CHOLECYSTECTOMY    . ESOPHAGOGASTRODUODENOSCOPY (EGD) WITH PROPOFOL N/A 05/30/2019   Procedure: ESOPHAGOGASTRODUODENOSCOPY (EGD)  WITH PROPOFOL;  Surgeon: Jonathon Bellows, MD;  Location: [facility];  Service: Gastroenterology;  Laterality: N/A;  . METATARSAL HEAD EXCISION Right 04/14/2018   Procedure: METATARSAL HEAD EXCISION-1ST, 2ND & 5TH MET HEADS;  Surgeon: Albertine Patricia, DPM;  Location: [facility];  Service: Podiatry;   Laterality: Right;  . PERIPHERAL VASCULAR CATHETERIZATION N/A 01/22/2016   Procedure: Dialysis/Perma Catheter Insertion;  Surgeon: Katha Cabal, MD;  Location: [place] [facility];  Service: Cardiovascular;  Laterality: N/A;  . PERIPHERAL VASCULAR CATHETERIZATION Left 05/14/2016   Procedure: A/V Shuntogram/Fistulagram;  Surgeon: Algernon Huxley, MD;  Location: [place] [facility];  Service: Cardiovascular;  Laterality: Left;  . PERIPHERAL VASCULAR CATHETERIZATION N/A 05/14/2016   Procedure: A/V Shunt Intervention;  Surgeon: Algernon Huxley, MD;  Location: [place] [facility];  Service: Cardiovascular;  Laterality: N/A;  . PERIPHERAL VASCULAR CATHETERIZATION N/A 06/25/2016   Procedure: Dialysis/Perma Catheter Removal;  Surgeon: Algernon Huxley, MD;  Location: [place] [facility];  Service: Cardiovascular;  Laterality: N/A;  . PERIPHERAL VASCULAR CATHETERIZATION Left 08/03/2016   Procedure: A/V Shuntogram/Fistulagram;  Surgeon: Algernon Huxley, MD;  Location: [place] [facility];  Service: Cardiovascular;  Laterality: Left;  . PERIPHERAL VASCULAR CATHETERIZATION N/A 08/03/2016   Procedure: A/V Shunt Intervention;  Surgeon: Algernon Huxley, MD;  Location: [place] [facility];  Service: Cardiovascular;  Laterality: N/A;  . PERIPHERAL VASCULAR CATHETERIZATION Left 08/07/2016   Procedure: A/V Shuntogram/Fistulagram;  Surgeon: Katha Cabal, MD;  Location: [place] [facility];  Service: Cardiovascular;  Laterality: Left;  . PERIPHERAL VASCULAR CATHETERIZATION Left 09/22/2016   Procedure: A/V Fistulagram;  Surgeon: Katha Cabal, MD;  Location: [place] [facility];  Service: Cardiovascular;  Laterality: Left;  . THYROIDECTOMY       A IV Location/Drains/Wounds Patient Lines/Drains/Airways Status   Active Line/Drains/Airways    Name:   Placement date:   Placement time:   Site:   Days:   Peripheral IV 08/13/19 Right Antecubital   08/13/19    1930    Antecubital   less than 1   Peripheral IV 08/13/19 Right  Hand   08/13/19    -    Hand   less than 1   Fistula / Graft Left Upper arm Arteriovenous fistula   -    -    Upper arm             Intake/Output Last 24 hours No intake or output data in the 24 hours ending 08/13/19 2147  Labs/Imaging Results for orders placed or performed during the hospital encounter of 08/13/19 (from the past 48 hour(s))  CBC with Differential     Status: Abnormal   Collection Time: 08/13/19  7:30 PM  Result Value Ref Range   WBC 10.8 (H) 4.0 - 10.5 K/uL   RBC 3.84 (L) 3.87 - 5.11 MIL/uL   Hemoglobin 11.7 (L) 12.0 - 15.0 g/dL   HCT 38.4 36.0 - 46.0 %   MCV 100.0 80.0 - 100.0 fL   MCH 30.5 26.0 - 34.0 pg   MCHC 30.5 30.0 - 36.0 g/dL   RDW 14.5 11.5 - 15.5 %   Platelets 247 150 - 400 K/uL   nRBC 0.0 0.0 - 0.2 %   Neutrophils Relative % 52 %   Neutro Abs 5.7 1.7 - 7.7 K/uL   Lymphocytes Relative 29 %   Lymphs Abs 3.1 0.7 - 4.0 K/uL   Monocytes Relative 12 %   Monocytes Absolute 1.3 (H) 0.1 - 1.0  K/uL   Eosinophils Relative 5 %   Eosinophils Absolute 0.5 0.0 - 0.5 K/uL   Basophils Relative 1 %   Basophils Absolute 0.1 0.0 - 0.1 K/uL   Immature Granulocytes 1 %   Abs Immature Granulocytes 0.10 (H) 0.00 - 0.07 K/uL    Comment: Performed at [facility], [address] [place], [place] [address]  Basic metabolic panel     Status: Abnormal   Collection Time: 08/13/19  7:30 PM  Result Value Ref Range   Sodium 141 135 - 145 mmol/L   Potassium 4.6 3.5 - 5.1 mmol/L   Chloride 104 98 - 111 mmol/L   CO2 22 22 - 32 mmol/L   Glucose, Bld 373 (H) 70 - 99 mg/dL   BUN 64 (H) 8 - 23 mg/dL   Creatinine, Ser 10.89 (H) 0.44 - 1.00 mg/dL   Calcium 8.7 (L) 8.9 - 10.3 mg/dL   GFR calc non Af Amer 3 (L) >60 mL/min   GFR calc Af Amer 4 (L) >60 mL/min   Anion gap 15 5 - 15    Comment: Performed at [facility], [address] [place], [place] [address]  Brain natriuretic peptide     Status: Abnormal   Collection Time: 08/13/19  7:30 PM  Result  Value Ref Range   B Natriuretic Peptide 350.0 (H) 0.0 - 100.0 pg/mL    Comment: Performed at [facility], [place], [place] [address]  Troponin I (High Sensitivity)     Status: Abnormal   Collection Time: 08/13/19  7:30 PM  Result Value Ref Range   Troponin I (High Sensitivity) 37 (H) <18 ng/L    Comment: (NOTE) Elevated high sensitivity troponin I (hsTnI) values and significant  changes across serial measurements may suggest ACS but many other  chronic and acute conditions are known to elevate hsTnI results.  Refer to the "Links" section for chest pain algorithms and additional  guidance. Performed at [facility], [address] [place], [place] [address]   Blood gas, venous     Status: Abnormal   Collection Time: 08/13/19  7:30 PM  Result Value Ref Range   FIO2 0.60    Delivery systems BILEVEL POSITIVE AIRWAY PRESSURE     Comment: I16, E8   LHR 8 resp/min   pH, Ven 7.21 (L) 7.250 - 7.430   pCO2, Ven 58 44.0 - 60.0 mmHg   pO2, Ven 63.0 (H) 32.0 - 45.0 mmHg   Bicarbonate 23.2 20.0 - 28.0 mmol/L   Acid-base deficit 5.5 (H) 0.0 - 2.0 mmol/L   O2 Saturation 86.1 %   Patient temperature 37.0    Collection site VEIN    Sample type VENOUS     Comment: Performed at [facility], [address], [place], [place] [address]  SARS Coronavirus 2 by RT PCR (hospital order, performed in [facility] hospital lab) Nasopharyngeal Nasopharyngeal Swab     Status: None   Collection Time: 08/13/19  7:30 PM   Specimen: Nasopharyngeal Swab  Result Value Ref Range   SARS Coronavirus 2 NEGATIVE NEGATIVE    Comment: (NOTE) If result is NEGATIVE SARS-CoV-2 target nucleic acids are NOT DETECTED. The SARS-CoV-2 RNA is generally detectable in upper and lower  respiratory specimens during the acute phase of infection. The lowest  concentration of SARS-CoV-2 viral copies this assay can detect is 250  copies / mL. A negative result does not preclude  SARS-CoV-2 infection  and should not be  used as the sole basis for treatment or other  patient management decisions.  A negative result may occur with  improper specimen collection / handling, submission of specimen other  than nasopharyngeal swab, presence of viral mutation(s) within the  areas targeted by this assay, and inadequate number of viral copies  (<250 copies / mL). A negative result must be combined with clinical  observations, patient history, and epidemiological information. If result is POSITIVE SARS-CoV-2 target nucleic acids are DETECTED. The SARS-CoV-2 RNA is generally detectable in upper and lower  respiratory specimens dur ing the acute phase of infection.  Positive  results are indicative of active infection with SARS-CoV-2.  Clinical  correlation with patient history and other diagnostic information is  necessary to determine patient infection status.  Positive results do  not rule out bacterial infection or co-infection with other viruses. If result is PRESUMPTIVE POSTIVE SARS-CoV-2 nucleic acids MAY BE PRESENT.   A presumptive positive result was obtained on the submitted specimen  and confirmed on repeat testing.  While 2019 novel coronavirus  (SARS-CoV-2) nucleic acids may be present in the submitted sample  additional confirmatory testing may be necessary for epidemiological  and / or clinical management purposes  to differentiate between  SARS-CoV-2 and other Sarbecovirus currently known to infect humans.  If clinically indicated additional testing with an alternate test  methodology [phone]) is advised. The SARS-CoV-2 RNA is generally  detectable in upper and lower respiratory sp ecimens during the acute  phase of infection. The expected result is Negative. Fact Sheet for Patients:  StrictlyIdeas.no Fact Sheet for Healthcare Providers: BankingDealers.co.za This test is not yet approved or cleared by the  [place] FDA and has been authorized for detection and/or diagnosis of SARS-CoV-2 by FDA under an Emergency Use Authorization (EUA).  This EUA will remain in effect (meaning this test can be used) for the duration of the COVID-19 declaration under Section 564(b)(1) of the Act, 21 U.S.C. section 360bbb-3(b)(1), unless the authorization is terminated or revoked sooner. Performed at [facility], [address] [place], [place] [address]   Lactic acid, plasma     Status: Abnormal   Collection Time: 08/13/19  7:30 PM  Result Value Ref Range   Lactic Acid, Venous 2.1 (HH) 0.5 - 1.9 mmol/L    Comment: CRITICAL RESULT CALLED TO, READ BACK BY AND VERIFIED WITH RN Vishwa Dais '@1959'$  08/13/19 AKT Performed at [facility], [address] [place], [place] [address]   Lactic acid, plasma     Status: None   Collection Time: 08/13/19  9:20 PM  Result Value Ref Range   Lactic Acid, Venous 1.6 0.5 - 1.9 mmol/L    Comment: Performed at [facility], [address] [place],  [address]   Dg Chest Port 1 View  Result Date: 08/13/2019 CLINICAL DATA:  68 year old female with shortness of breath. EXAM: PORTABLE CHEST 1 VIEW COMPARISON:  Chest radiograph dated 02/03/2019 FINDINGS: There is shallow inspiration with bibasilar atelectasis. There is cardiomegaly with vascular congestion and probable mild edema. Pneumonia is not excluded. Clinical correlation is recommended. Trace bilateral pleural effusions may be present. No pneumothorax. No acute osseous pathology. Left axillary vascular stent noted. IMPRESSION: Cardiomegaly with vascular congestion and probable mild edema. Findings may represent fluid overload. Pneumonia is not excluded. Clinical correlation is recommended. Electronically Signed   By: Anner Crete M.D.   On: 08/13/2019 20:08    Pending Labs Unresulted Labs (From admission, onward)    Start  Ordered   08/13/19 1929  Blood culture (routine x 2)   BLOOD CULTURE X 2,   STAT     08/13/19 1928   Signed and Held  CBC  Once,   R     Signed and Held   Signed and Held  TSH  Add-on,   R     Signed and Held   Signed and Held  Hemoglobin A1c  Add-on,   R     Signed and Held          Vitals/Pain Today's Vitals   08/13/19 2115 08/13/19 2120 08/13/19 2125 08/13/19 2130  BP: (!) 146/79 (!) 154/76 (!) 152/81 (!) 157/80  Pulse: 86 82 82 86  Resp: (!) 28 (!) 24 (!) 31 (!) 25  SpO2: 100% 100% 100% 100%  Weight:      Height:      PainSc:        Isolation Precautions No active isolations  Medications Medications  nitroGLYCERIN 50 mg in dextrose 5 % 250 mL (0.2 mg/mL) infusion (25 mcg/min Intravenous Rate/Dose Change 08/13/19 1940)  furosemide (LASIX) 10 MG/ML injection (has no administration in time range)  nitroGLYCERIN 0.2 mg/mL in dextrose 5 % infusion (has no administration in time range)  furosemide (LASIX) 10 MG/ML injection (has no administration in time range)  Chlorhexidine Gluconate Cloth 2 % PADS 6 each (has no administration in time range)  furosemide (LASIX) injection 120 mg (120 mg Intravenous Given 08/13/19 1947)    Mobility non-ambulatory High fall risk   Focused Assessments Pulmonary Assessment Handoff:  Lung sounds: Bilateral Breath Sounds: Diminished O2 Device: Bi-PAP        R Recommendations: See Admitting Provider Note  Report given to:   Additional Notes: NA

## 2019-08-13 NOTE — ED Notes (Signed)
Lab collected 2nd set of blood cultures.

## 2019-08-13 NOTE — ED Notes (Signed)
Date and time results received: 08/13/19 1957 (use smartphrase ".now" to insert current time)  Test: Lactic Critical Value: 2.1  Name of Provider Notified: [place]  Orders Received? Or Actions Taken?: NA

## 2019-08-13 NOTE — ED Notes (Signed)
Per EDP do not titrate nitro at this time.

## 2019-08-13 NOTE — Progress Notes (Signed)
   08/13/19 1920  Clinical Encounter Type  Visited With Patient  Visit Type Initial  Referral From Nurse  Consult/Referral To Chaplain  Spiritual Encounters  Spiritual Needs Emotional  Pt was on breathing machine upon arrival. Pt declined pastoral visit. No further needs at this time.

## 2019-08-13 NOTE — ED Notes (Signed)
Sister of pt is in lobby when ready call 1st RN

## 2019-08-13 NOTE — H&P (Signed)
Kendra [name] is an 68 y.o. female.   Chief Complaint: Shortness of breath HPI: The patient with past medical history of end-stage renal disease on dialysis, hypothyroidism and hypertension presents to the emergency department via EMS due to respiratory distress.  The patient reports feeling acutely short of breath this afternoon.  She denies chest pain.  She was transported on CPAP and given Lasix IV in the emergency department.  She was transferred to BiPAP which improved her work of breathing.  Chest x-ray showed pulmonary edema.  Nephrology was contacted for emergent dialysis prior to the emergency department staff calling the hospitalist service for admission.  Past Medical History:  Diagnosis Date  . Anemia of chronic renal failure    DIALYSIS m/w/f  . Diabetic neuropathy ([place])   . Diabetic retinopathy ([place])   . Diastolic CHF ([place])   . ESRD (end stage renal disease) ([place])   . GERD (gastroesophageal reflux disease)   . Hyperparathyroidism, secondary renal ([place])   . Hypertension   . Hypothyroidism   . Insulin dependent diabetes mellitus ([place])   . Neuropathy    feet and fingers  . Osteoarthritis    bilateral shoulders  . Peripheral vascular disease ([place])   . Primary hypertension   . Shingles outbreak 04/2017    Past Surgical History:  Procedure Laterality Date  . A/V FISTULAGRAM Left 08/19/2017   Procedure: A/V Fistulagram;  Surgeon: Algernon Huxley, MD;  Location: [facility];  Service: Cardiovascular;  Laterality: Left;  . A/V FISTULAGRAM N/A 03/10/2018   Procedure: A/V FISTULAGRAM;  Surgeon: Algernon Huxley, MD;  Location: [facility];  Service: Cardiovascular;  Laterality: N/A;  . A/V FISTULAGRAM Left 08/24/2018   Procedure: A/V FISTULAGRAM;  Surgeon: Algernon Huxley, MD;  Location: [facility];  Service: Cardiovascular;  Laterality: Left;  . A/V FISTULAGRAM Left 02/02/2019   Procedure: A/V FISTULAGRAM;  Surgeon: Algernon Huxley, MD;  Location: [facility];  Service: Cardiovascular;  Laterality: Left;  . ABDOMINAL HYSTERECTOMY    . ARTHRODESIS METATARSALPHALANGEAL JOINT (MTPJ) Right 04/14/2018   Procedure: ARTHRODESIS; HALLUX/IP JOINT;  Surgeon: Albertine Patricia, DPM;  Location: [facility];  Service: Podiatry;  Laterality: Right;  . BASCILIC VEIN TRANSPOSITION Left 03/04/2016   Procedure: BASCILIC VEIN TRANSPOSITION ( STAGE 1 );  Surgeon: Algernon Huxley, MD;  Location: [facility];  Service: Vascular;  Laterality: Left;  . CATARACT EXTRACTION W/PHACO Right 10/07/2017   Procedure: CATARACT EXTRACTION PHACO AND INTRAOCULAR LENS PLACEMENT (IOC)-RIGHT DIABETIC;  Surgeon: Eulogio Bear, MD;  Location: [facility];  Service: Ophthalmology;  Laterality: Right;  [place] 00:30.8 AP% 8.9 CDE 2.75 FLUID PACK LOT # [id number]  . CATARACT EXTRACTION W/PHACO Left 11/04/2017   Procedure: CATARACT EXTRACTION PHACO AND INTRAOCULAR LENS PLACEMENT (IOC);  Surgeon: Eulogio Bear, MD;  Location: [facility];  Service: Ophthalmology;  Laterality: Left;  [place] 00:30.0 AP% 8.0 CDE 2.41 Fluid Pack lot # [id number]  . CHOLECYSTECTOMY    . ESOPHAGOGASTRODUODENOSCOPY (EGD) WITH PROPOFOL N/A 05/30/2019   Procedure: ESOPHAGOGASTRODUODENOSCOPY (EGD) WITH PROPOFOL;  Surgeon: Jonathon Bellows, MD;  Location: [facility];  Service: Gastroenterology;  Laterality: N/A;  . METATARSAL HEAD EXCISION Right 04/14/2018   Procedure: METATARSAL HEAD EXCISION-1ST, 2ND & 5TH MET HEADS;  Surgeon: Albertine Patricia, DPM;  Location: [facility];  Service: Podiatry;  Laterality: Right;  . PERIPHERAL VASCULAR CATHETERIZATION N/A 01/22/2016   Procedure: Dialysis/Perma Catheter Insertion;  Surgeon: Katha Cabal, MD;  Location: [facility];  Service: Cardiovascular;  Laterality: N/A;  . PERIPHERAL VASCULAR CATHETERIZATION Left 05/14/2016   Procedure: A/V Shuntogram/Fistulagram;  Surgeon: Algernon Huxley, MD;  Location: [facility];  Service: Cardiovascular;  Laterality: Left;  . PERIPHERAL VASCULAR  CATHETERIZATION N/A 05/14/2016   Procedure: A/V Shunt Intervention;  Surgeon: Algernon Huxley, MD;  Location: [facility];  Service: Cardiovascular;  Laterality: N/A;  . PERIPHERAL VASCULAR CATHETERIZATION N/A 06/25/2016   Procedure: Dialysis/Perma Catheter Removal;  Surgeon: Algernon Huxley, MD;  Location: [place] [facility];  Service: Cardiovascular;  Laterality: N/A;  . PERIPHERAL VASCULAR CATHETERIZATION Left 08/03/2016   Procedure: A/V Shuntogram/Fistulagram;  Surgeon: Algernon Huxley, MD;  Location: [facility];  Service: Cardiovascular;  Laterality: Left;  . PERIPHERAL VASCULAR CATHETERIZATION N/A 08/03/2016   Procedure: A/V Shunt Intervention;  Surgeon: Algernon Huxley, MD;  Location: [facility];  Service: Cardiovascular;  Laterality: N/A;  . PERIPHERAL VASCULAR CATHETERIZATION Left 08/07/2016   Procedure: A/V Shuntogram/Fistulagram;  Surgeon: Katha Cabal, MD;  Location: [facility];  Service: Cardiovascular;  Laterality: Left;  . PERIPHERAL VASCULAR CATHETERIZATION Left 09/22/2016   Procedure: A/V Fistulagram;  Surgeon: Katha Cabal, MD;  Location: [facility];  Service: Cardiovascular;  Laterality: Left;  . THYROIDECTOMY      Family History  Problem Relation Age of Onset  . Renal Disease Brother        with renal transplant  . Diabetes Mellitus II Father   . Diabetes Mellitus II Mother   . Diabetes Mellitus II Brother   . Breast cancer Neg Hx    Social History:  reports that she has never smoked. She has never used smokeless tobacco. She reports that she does not drink alcohol or use drugs.  Allergies: No Known Allergies  Prior to Admission medications   Medication Sig Start Date End Date Taking? Authorizing Provider  calcium acetate (PHOSLO) 667 MG capsule Take 1,334 mg by mouth 3 (three) times daily with meals.    Yes [provider]  docusate sodium (COLACE) 100 MG capsule Take 1 capsule (100 mg total) by mouth daily as  needed. 07/14/19 07/13/20 Yes Earleen Newport, MD  Insulin Glargine (LANTUS SOLOSTAR) 100 UNIT/ML Solostar Pen Inject 20 Units into the skin daily at 10 pm.   Yes [provider]  levothyroxine (SYNTHROID, LEVOTHROID) 175 MCG tablet Take 175 mcg by mouth daily before breakfast.    Yes [provider]  acetaminophen (TYLENOL) 500 MG tablet Take 500 mg by mouth every 8 (eight) hours as needed for mild pain or moderate pain.    [provider]  amLODipine (NORVASC) 10 MG tablet Take 10 mg by mouth every evening.  04/10/19   [provider]  atorvastatin (LIPITOR) 40 MG tablet Take 40 mg by mouth at bedtime.    [provider]  carvedilol (COREG) 12.5 MG tablet Take 12.5 mg by mouth daily with supper. 04/16/19   [provider]  clopidogrel (PLAVIX) 75 MG tablet Take 75 mg by mouth daily with breakfast.  09/08/16   [provider]  docusate sodium (COLACE) 50 MG capsule Take 200 mg by mouth 2 (two) times daily.     [provider]  fosinopril (MONOPRIL) 40 MG tablet Take 40 mg by mouth daily.  05/25/18   [provider]  furosemide (LASIX) 80 MG tablet Take 80 mg by mouth every Tuesday, Thursday, Saturday, and Sunday. In the morning    [provider]  HYDROcodone-acetaminophen (NORCO/VICODIN) 5-325 MG tablet Take 1 tablet by mouth every 6 (six) hours as needed for moderate pain. 07/14/19   Earleen Newport, MD  lidocaine-prilocaine (EMLA) cream Apply 1 application topically Every Tuesday,Thursday,and Saturday with dialysis.     [provider]  multivitamin (RENA-VIT) TABS tablet Take 1 tablet by mouth daily.    [provider]  omeprazole (PRILOSEC) 20 MG capsule Take 20 mg by mouth daily.    [provider]  polyethylene glycol (MIRALAX / GLYCOLAX) packet Take 17 g by mouth daily. 05/08/17   Gladstone Lighter, MD     Results for orders placed or performed during the hospital  encounter of 08/13/19 (from the past 48 hour(s))  CBC with Differential     Status: Abnormal   Collection Time: 08/13/19  7:30 PM  Result Value Ref Range   WBC 10.8 (H) 4.0 - 10.5 K/uL   RBC 3.84 (L) 3.87 - 5.11 MIL/uL   Hemoglobin 11.7 (L) 12.0 - 15.0 g/dL   HCT 38.4 36.0 - 46.0 %   MCV 100.0 80.0 - 100.0 fL   MCH 30.5 26.0 - 34.0 pg   MCHC 30.5 30.0 - 36.0 g/dL   RDW 14.5 11.5 - 15.5 %   Platelets 247 150 - 400 K/uL   nRBC 0.0 0.0 - 0.2 %   Neutrophils Relative % 52 %   Neutro Abs 5.7 1.7 - 7.7 K/uL   Lymphocytes Relative 29 %   Lymphs Abs 3.1 0.7 - 4.0 K/uL   Monocytes Relative 12 %   Monocytes Absolute 1.3 (H) 0.1 - 1.0 K/uL   Eosinophils Relative 5 %   Eosinophils Absolute 0.5 0.0 - 0.5 K/uL   Basophils Relative 1 %   Basophils Absolute 0.1 0.0 - 0.1 K/uL   Immature Granulocytes 1 %   Abs Immature Granulocytes 0.10 (H) 0.00 - 0.07 K/uL    Comment: Performed at [facility], [address] [place], [place] [address]  Basic metabolic panel     Status: Abnormal   Collection Time: 08/13/19  7:30 PM  Result Value Ref Range   Sodium 141 135 - 145 mmol/L   Potassium 4.6 3.5 - 5.1 mmol/L   Chloride 104 98 - 111 mmol/L   CO2 22 22 - 32 mmol/L   Glucose, Bld 373 (H) 70 - 99 mg/dL   BUN 64 (H) 8 - 23 mg/dL   Creatinine, Ser 10.89 (H) 0.44 - 1.00 mg/dL   Calcium 8.7 (L) 8.9 - 10.3 mg/dL   GFR calc non Af Amer 3 (L) >60 mL/min   GFR calc Af Amer 4 (L) >60 mL/min   Anion gap 15 5 - 15    Comment: Performed at [facility], [address] [place], [place] [address]  Brain natriuretic peptide     Status: Abnormal   Collection Time: 08/13/19  7:30 PM  Result Value Ref Range   B Natriuretic Peptide 350.0 (H) 0.0 - 100.0 pg/mL    Comment: Performed at [facility], [place], [place] [address]  Troponin I (High Sensitivity)     Status: Abnormal   Collection Time: 08/13/19  7:30 PM  Result Value Ref Range   Troponin I (High Sensitivity)  37 (H) <18 ng/L    Comment: (NOTE) Elevated high sensitivity troponin I (hsTnI) values and significant  changes across serial measurements may suggest ACS but many other  chronic and acute conditions are known to elevate hsTnI results.  Refer to the "Links" section for chest pain algorithms and additional  guidance. Performed at [facility], [address] [place], [place] [address]   Blood gas, venous     Status: Abnormal   Collection Time: 08/13/19  7:30 PM  Result Value Ref Range   FIO2 0.60    Delivery systems BILEVEL POSITIVE AIRWAY PRESSURE     Comment: I16, E8   LHR 8 resp/min   pH, Ven 7.21 (L) 7.250 - 7.430   pCO2, Ven 58 44.0 - 60.0 mmHg   pO2, Ven 63.0 (H) 32.0 - 45.0 mmHg   Bicarbonate 23.2 20.0 - 28.0 mmol/L   Acid-base deficit 5.5 (H) 0.0 - 2.0 mmol/L   O2 Saturation 86.1 %   Patient temperature 37.0    Collection site VEIN    Sample type VENOUS     Comment: Performed at [facility], [address], [place], [place] [address]  SARS Coronavirus 2 by RT PCR (hospital order, performed in [facility] hospital lab) Nasopharyngeal Nasopharyngeal Swab     Status: None   Collection Time: 08/13/19  7:30 PM   Specimen: Nasopharyngeal Swab  Result Value Ref Range   SARS Coronavirus 2 NEGATIVE NEGATIVE    Comment: (NOTE) If result is NEGATIVE SARS-CoV-2 target nucleic acids are NOT DETECTED. The SARS-CoV-2 RNA is generally detectable in upper and lower  respiratory specimens during the acute phase of infection. The lowest  concentration of SARS-CoV-2 viral copies this assay can detect is 250  copies / mL. A negative result does not preclude SARS-CoV-2 infection  and should not be used as the sole basis for treatment or other  patient management decisions.  A negative result may occur with  improper specimen collection / handling, submission of specimen other  than nasopharyngeal swab, presence of viral mutation(s) within the  areas targeted by this  assay, and inadequate number of viral copies  (<250 copies / mL). A negative result must be combined with clinical  observations, patient history, and epidemiological information. If result is POSITIVE SARS-CoV-2 target nucleic acids are DETECTED. The SARS-CoV-2 RNA is generally detectable in upper and lower  respiratory specimens dur ing the acute phase of infection.  Positive  results are indicative of active infection with SARS-CoV-2.  Clinical  correlation with patient history and other diagnostic information is  necessary to determine patient infection status.  Positive results do  not rule out bacterial infection or co-infection with other viruses. If result is PRESUMPTIVE POSTIVE SARS-CoV-2 nucleic acids MAY BE PRESENT.   A presumptive positive result was obtained on the submitted specimen  and confirmed on repeat testing.  While 2019 novel coronavirus  (SARS-CoV-2) nucleic acids may be present in the submitted sample  additional confirmatory testing may be necessary for epidemiological  and / or clinical management purposes  to differentiate between  SARS-CoV-2 and other Sarbecovirus currently known to infect humans.  If clinically indicated additional testing with an alternate test  methodology [phone]) is advised. The SARS-CoV-2 RNA is generally  detectable in upper and lower respiratory sp ecimens during the acute  phase of infection. The expected result is Negative. Fact Sheet for Patients:  StrictlyIdeas.no Fact Sheet for Healthcare Providers: BankingDealers.co.za This test is not yet approved or cleared by the [place] FDA and has been authorized for detection and/or diagnosis of SARS-CoV-2 by FDA under an Emergency Use Authorization (EUA).  This EUA will remain in effect (meaning this test can be used) for the duration of the  COVID-19 declaration under Section 564(b)(1) of the Act, 21 U.S.C. section 360bbb-3(b)(1),  unless the authorization is terminated or revoked sooner. Performed at [facility], [place], [place] [address]   Lactic acid, plasma     Status: Abnormal   Collection Time: 08/13/19  7:30 PM  Result Value Ref Range   Lactic Acid, Venous 2.1 (HH) 0.5 - 1.9 mmol/L    Comment: CRITICAL RESULT CALLED TO, READ BACK BY AND VERIFIED WITH RN MAUREEN _0  08/13/19 AKT Performed at [facility], [address], [place],  [address]    Dg Chest Port 1 View  Result Date: 08/13/2019 CLINICAL DATA:  68 year old female with shortness of breath. EXAM: PORTABLE CHEST 1 VIEW COMPARISON:  Chest radiograph dated 02/03/2019 FINDINGS: There is shallow inspiration with bibasilar atelectasis. There is cardiomegaly with vascular congestion and probable mild edema. Pneumonia is not excluded. Clinical correlation is recommended. Trace bilateral pleural effusions may be present. No pneumothorax. No acute osseous pathology. Left axillary vascular stent noted. IMPRESSION: Cardiomegaly with vascular congestion and probable mild edema. Findings may represent fluid overload. Pneumonia is not excluded. Clinical correlation is recommended. Electronically Signed   By: Anner Crete M.D.   On: 08/13/2019 20:08    Review of Systems  Constitutional: Negative for chills and fever.  HENT: Negative for sore throat and tinnitus.   Eyes: Negative for blurred vision and redness.  Respiratory: Positive for cough and shortness of breath.   Cardiovascular: Negative for chest pain, palpitations, orthopnea and PND.  Gastrointestinal: Negative for abdominal pain, diarrhea, nausea and vomiting.  Genitourinary: Negative for dysuria, frequency and urgency.  Musculoskeletal: Negative for joint pain and myalgias.  Skin: Negative for rash.       No lesions  Neurological: Negative for speech change, focal weakness and weakness.  Endo/Heme/Allergies: Does not bruise/bleed easily.       No  temperature intolerance  Psychiatric/Behavioral: Negative for depression and suicidal ideas.    Blood pressure (!) 154/76, pulse 82, resp. rate (!) 24, height _1  (1.676 m), weight 102.1 kg, SpO2 100 %. Physical Exam  Vitals reviewed. Constitutional: She is oriented to person, place, and time. She appears well-developed and well-nourished. She appears distressed.  HENT:  Head: Normocephalic and atraumatic.  Mouth/Throat: Oropharynx is clear and moist.  Eyes: Pupils are equal, round, and reactive to light. Conjunctivae and EOM are normal.  Neck: Normal range of motion. Neck supple. No JVD present. No tracheal deviation present. No thyromegaly present.  Cardiovascular: Normal rate, regular rhythm and normal heart sounds. Exam reveals no gallop and no friction rub.  No murmur heard. Respiratory: Breath sounds normal. She is in respiratory distress.  GI: Soft. Bowel sounds are normal. She exhibits no distension. There is no abdominal tenderness.  Genitourinary:    Genitourinary Comments: Deferred   Musculoskeletal: Normal range of motion.        General: No edema.  Lymphadenopathy:    She has no cervical adenopathy.  Neurological: She is alert and oriented to person, place, and time. No cranial nerve deficit. She exhibits normal muscle tone.  Skin: Skin is warm and dry. No rash noted. No erythema.  Psychiatric: She has a normal mood and affect. Her behavior is normal. Judgment and thought content normal.     Assessment/Plan This is a 68 year old female admitted for respiratory failure. 1.  Respiratory failure: Acute; with hypoxemia.  Continue BiPAP as needed.  The patient needs urgent dialysis which will help improve pulmonary edema.  Supplemental  oxygen as needed. 2.  ESRD: On hemodialysis Monday Wednesday Friday; plan as above.  Continue Lasix per home regimen as the patient still makes some urine.  Nephrology consulted 3.  Hypertension: Continue nitroglycerin drip until blood  pressure is normalized.  Then may resume amlodipine, carvedilol and ACE inhibitor per nephrology recommendations. 4.  Diabetes mellitus type 2: Continue basal insulin as well as sliding scale while hospitalized. 5.  Hypothyroidism: Check TSH; continue Synthroid 6.  CAD: Stable; continue Plavix 7.  DVT prophylaxis: Heparin 8.  GI prophylaxis: None The patient is a full code.  Time spent on admission orders and patient care approximately 45 minutes  Harrie Foreman, MD 08/13/2019, 9:32 PM

## 2019-08-13 NOTE — ED Notes (Signed)
edp in room talking with pt and family

## 2019-08-13 NOTE — Progress Notes (Signed)
Patient transported on Bipap to Dialysis. RN present. No complications. Covid test negative.

## 2019-08-13 NOTE — ED Notes (Signed)
Pt to dialysis with RT and this Rn

## 2019-08-13 NOTE — ED Notes (Signed)
Pt states chest pain is better

## 2019-08-13 NOTE — ED Provider Notes (Signed)
[facility] Emergency Department Provider Note       Time seen: ----------------------------------------- 7:28 PM on 08/13/2019 -----------------------------------------   I have reviewed the triage vital signs and the nursing notes.  HISTORY   Chief Complaint Shortness of Breath    HPI Kendra [name] is a 68 y.o. female with a history of chronic renal failure on Monday Wednesday and Friday dialysis, GERD, hypertension, hypothyroidism, peripheral vascular disease who presents to the ED for severe shortness of breath.  Patient states she had a coughing worsening shortness of breath.  Undergoes dialysis and last dialysis was normal on Friday.  She has not had a fever.  She arrives on CPAP placed by EMS for hypoxia and respiratory distress  Past Medical History:  Diagnosis Date  . Anemia of chronic renal failure    DIALYSIS m/w/f  . Diabetic neuropathy ([place])   . Diabetic retinopathy ([place])   . Diastolic CHF ([place])   . ESRD (end stage renal disease) ([place])   . GERD (gastroesophageal reflux disease)   . Hyperparathyroidism, secondary renal ([place])   . Hypertension   . Hypothyroidism   . Insulin dependent diabetes mellitus ([place])   . Neuropathy    feet and fingers  . Osteoarthritis    bilateral shoulders  . Peripheral vascular disease ([place])   . Primary hypertension   . Shingles outbreak 04/2017    Patient Active Problem List   Diagnosis Date Noted  . Pulmonary edema 02/02/2019  . Acute on chronic respiratory failure with hypoxia ([place]) 02/02/2019  . Pyelonephritis 05/04/2017  . Complication from renal dialysis device 09/21/2016  . ESRD on dialysis ([place]) 09/21/2016  . BP (high blood pressure) 07/15/2015  . Diabetes mellitus, type 2 ([place]) 07/15/2015  . Absolute anemia 07/15/2015    Past Surgical History:  Procedure Laterality Date  . A/V FISTULAGRAM Left 08/19/2017   Procedure: A/V Fistulagram;  Surgeon: Algernon Huxley, MD;  Location: [place] [facility];  Service: Cardiovascular;  Laterality: Left;  . A/V FISTULAGRAM N/A 03/10/2018   Procedure: A/V FISTULAGRAM;  Surgeon: Algernon Huxley, MD;  Location: [place] [facility];  Service: Cardiovascular;  Laterality: N/A;  . A/V FISTULAGRAM Left 08/24/2018   Procedure: A/V FISTULAGRAM;  Surgeon: Algernon Huxley, MD;  Location: [place] [facility];  Service: Cardiovascular;  Laterality: Left;  . A/V FISTULAGRAM Left 02/02/2019   Procedure: A/V FISTULAGRAM;  Surgeon: Algernon Huxley, MD;  Location: [place] [facility];  Service: Cardiovascular;  Laterality: Left;  . ABDOMINAL HYSTERECTOMY    . ARTHRODESIS METATARSALPHALANGEAL JOINT (MTPJ) Right 04/14/2018   Procedure: ARTHRODESIS; HALLUX/IP JOINT;  Surgeon: Albertine Patricia, DPM;  Location: [facility];  Service: Podiatry;  Laterality: Right;  . BASCILIC VEIN TRANSPOSITION Left 03/04/2016   Procedure: BASCILIC VEIN TRANSPOSITION ( STAGE 1 );  Surgeon: Algernon Huxley, MD;  Location: [facility];  Service: Vascular;  Laterality: Left;  . CATARACT EXTRACTION W/PHACO Right 10/07/2017   Procedure: CATARACT EXTRACTION PHACO AND INTRAOCULAR LENS PLACEMENT (IOC)-RIGHT DIABETIC;  Surgeon: Eulogio Bear, MD;  Location: [facility];  Service: Ophthalmology;  Laterality: Right;  [place] 00:30.8 AP% 8.9 CDE 2.75 FLUID PACK LOT # [id number]  . CATARACT EXTRACTION W/PHACO Left 11/04/2017   Procedure: CATARACT EXTRACTION PHACO AND INTRAOCULAR LENS PLACEMENT (IOC);  Surgeon: Eulogio Bear, MD;  Location: [facility];  Service: Ophthalmology;  Laterality: Left;  [place] 00:30.0 AP% 8.0 CDE 2.41 Fluid Pack lot # [id number]  . CHOLECYSTECTOMY    . ESOPHAGOGASTRODUODENOSCOPY (EGD)  WITH PROPOFOL N/A 05/30/2019   Procedure: ESOPHAGOGASTRODUODENOSCOPY (EGD) WITH PROPOFOL;  Surgeon: Jonathon Bellows, MD;  Location[facility];  Service: Gastroenterology;  Laterality: N/A;  . METATARSAL HEAD EXCISION Right 04/14/2018   Procedure: METATARSAL HEAD EXCISION-1ST, 2ND & 5TH MET HEADS;  Surgeon: Albertine Patricia, DPM;  Location: [facility];  Service: Podiatry;  Laterality: Right;  . PERIPHERAL VASCULAR CATHETERIZATION N/A 01/22/2016   Procedure: Dialysis/Perma Catheter Insertion;  Surgeon: Katha Cabal, MD;  Location: [place] [facility];  Service: Cardiovascular;  Laterality: N/A;  . PERIPHERAL VASCULAR CATHETERIZATION Left 05/14/2016   Procedure: A/V Shuntogram/Fistulagram;  Surgeon: Algernon Huxley, MD;  Location: [place] [facility];  Service: Cardiovascular;  Laterality: Left;  . PERIPHERAL VASCULAR CATHETERIZATION N/A 05/14/2016   Procedure: A/V Shunt Intervention;  Surgeon: Algernon Huxley, MD;  Location: [place] [facility];  Service: Cardiovascular;  Laterality: N/A;  . PERIPHERAL VASCULAR CATHETERIZATION N/A 06/25/2016   Procedure: Dialysis/Perma Catheter Removal;  Surgeon: Algernon Huxley, MD;  Location: [place] [facility];  Service: Cardiovascular;  Laterality: N/A;  . PERIPHERAL VASCULAR CATHETERIZATION Left 08/03/2016   Procedure: A/V Shuntogram/Fistulagram;  Surgeon: Algernon Huxley, MD;  Location: [place] [facility];  Service: Cardiovascular;  Laterality: Left;  . PERIPHERAL VASCULAR CATHETERIZATION N/A 08/03/2016   Procedure: A/V Shunt Intervention;  Surgeon: Algernon Huxley, MD;  Location: [place] [facility];  Service: Cardiovascular;  Laterality: N/A;  . PERIPHERAL VASCULAR CATHETERIZATION Left 08/07/2016   Procedure: A/V Shuntogram/Fistulagram;  Surgeon: Katha Cabal, MD;  Location: [place] [facility];  Service: Cardiovascular;  Laterality: Left;  . PERIPHERAL VASCULAR CATHETERIZATION Left 09/22/2016   Procedure: A/V Fistulagram;  Surgeon: Katha Cabal, MD;  Location: [place] [facility];  Service: Cardiovascular;  Laterality: Left;  . THYROIDECTOMY      Allergies Patient has no known allergies.  Social History Social History   Tobacco Use  . Smoking status: Never Smoker  . Smokeless tobacco: Never Used  Substance Use Topics  . Alcohol use: No    Alcohol/week: 0.0  standard drinks  . Drug use: No   Review of Systems Constitutional: Negative for fever. Cardiovascular: Negative for chest pain. Respiratory: Positive for shortness of breath and cough Review of systems otherwise unknown  All systems negative/normal/unremarkable except as stated in the HPI  ____________________________________________   PHYSICAL EXAM:  VITAL SIGNS: ED Triage Vitals  Enc Vitals Group     BP      Pulse      Resp      Temp      Temp src      SpO2      Weight      Height      Head Circumference      Peak Flow      Pain Score      Pain Loc      Pain Edu?      Excl. in [place]?     Constitutional: Alert and oriented.  Moderate distress ENT      Head: Normocephalic and atraumatic.      Nose: No congestion/rhinnorhea.      Mouth/Throat: Mucous membranes are moist.      Neck: No stridor. Cardiovascular: Normal rate, regular rhythm. No murmurs, rubs, or gallops. Respiratory: Tachypnea with rales bilaterally Gastrointestinal: Soft and nontender. Normal bowel sounds Musculoskeletal: Nontender with normal range of motion in extremities. No lower extremity tenderness nor edema. Neurologic:  Normal speech and language. No gross focal neurologic deficits  are appreciated.  Skin:  Skin is warm, dry and intact. No rash noted. Psychiatric: Mood and affect are normal. Speech and behavior are normal.  ____________________________________________  EKG: Interpreted by me.  Sinus tachycardia with a rate of 117 bpm, LVH with repolarization abnormality, normal QT  ____________________________________________  ED COURSE:  As part of my medical decision making, I reviewed the following data within the [place] History obtained from family if available, nursing notes, old chart and ekg, as well as notes from prior ED visits. Patient presented for respiratory distress, we will assess with labs and imaging as indicated at this time.   Procedures  KAVITA BARTL was evaluated in Emergency Department on 08/13/2019 for the symptoms described in the history of present illness. She was evaluated in the context of the global COVID-19 pandemic, which necessitated consideration that the patient might be at risk for infection with the SARS-CoV-2 virus that causes COVID-19. Institutional protocols and algorithms that pertain to the evaluation of patients at risk for COVID-19 are in a state of rapid change based on information released by regulatory bodies including the CDC and federal and state organizations. These policies and algorithms were followed during the patient's care in the ED.  ____________________________________________   LABS (pertinent positives/negatives)  Labs Reviewed  CBC WITH DIFFERENTIAL/PLATELET - Abnormal; Notable for the following components:      Result Value   WBC 10.8 (*)    RBC 3.84 (*)    Hemoglobin 11.7 (*)    Monocytes Absolute 1.3 (*)    Abs Immature Granulocytes 0.10 (*)    All other components within normal limits  BASIC METABOLIC PANEL - Abnormal; Notable for the following components:   Glucose, Bld 373 (*)    BUN 64 (*)    Creatinine, Ser 10.89 (*)    Calcium 8.7 (*)    GFR calc non Af Amer 3 (*)    GFR calc Af Amer 4 (*)    All other components within normal limits  BRAIN NATRIURETIC PEPTIDE - Abnormal; Notable for the following components:   B Natriuretic Peptide 350.0 (*)    All other components within normal limits  BLOOD GAS, VENOUS - Abnormal; Notable for the following components:   pH, Ven 7.21 (*)    pO2, Ven 63.0 (*)    Acid-base deficit 5.5 (*)    All other components within normal limits  LACTIC ACID, PLASMA - Abnormal; Notable for the following components:   Lactic Acid, Venous 2.1 (*)    All other components within normal limits  TROPONIN I (HIGH SENSITIVITY) - Abnormal; Notable for the following components:   Troponin I (High Sensitivity) 37 (*)    All other components within normal limits   SARS CORONAVIRUS 2 BY RT PCR (HOSPITAL ORDER, [place] [facility])  CULTURE, BLOOD (ROUTINE X 2)  CULTURE, BLOOD (ROUTINE X 2)   CRITICAL CARE Performed by: Laurence Aly   Total critical care time: 30 minutes  Critical care time was exclusive of separately billable procedures and treating other patients.  Critical care was necessary to treat or prevent imminent or life-threatening deterioration.  Critical care was time spent personally by me on the following activities: development of treatment plan with patient and/or surrogate as well as nursing, discussions with consultants, evaluation of patient's response to treatment, examination of patient, obtaining history from patient or surrogate, ordering and performing treatments and interventions, ordering and review of laboratory studies, ordering and review of  radiographic studies, pulse oximetry and re-evaluation of patient's condition.  RADIOLOGY Images were viewed by me  Chest x-ray  IMPRESSION:  Cardiomegaly with vascular congestion and probable mild edema.  Findings may represent fluid overload. Pneumonia is not excluded.  Clinical correlation is recommended.  ____________________________________________   DIFFERENTIAL DIAGNOSIS   Pulmonary edema, COVID-19, PE, pneumothorax, pneumonia  FINAL ASSESSMENT AND PLAN  Acute respiratory distress, flash pulmonary edema, end-stage renal disease on dialysis   Plan: The patient had presented for acute respiratory distress and was immediately placed on BiPAP. Patient's labs revealed noted end-stage renal disease on dialysis, she was also acidemic. Patient's imaging revealed cardiomegaly with vascular congestion and edema suggesting volume overload.  She was placed on a nitroglycerin drip and given IV Lasix and continued on BiPAP.  She appeared to be improving some but still very dyspneic.  I contacted nephrology to arrange for emergent dialysis.   Laurence Aly, MD    Note: This note was generated in part or whole with voice recognition software. Voice recognition is usually quite accurate but there are transcription errors that can and very often do occur. I apologize for any typographical errors that were not detected and corrected.     Earleen Newport, MD 08/13/19 2013

## 2019-08-14 DIAGNOSIS — R0602 Shortness of breath: Secondary | ICD-10-CM | POA: Diagnosis present

## 2019-08-14 LAB — HEMOGLOBIN A1C
Hgb A1c MFr Bld: 7.2 % — ABNORMAL HIGH (ref 4.8–5.6)
Mean Plasma Glucose: 159.94 mg/dL

## 2019-08-14 LAB — CBC
HCT: 31 % — ABNORMAL LOW (ref 36.0–46.0)
Hemoglobin: 9.7 g/dL — ABNORMAL LOW (ref 12.0–15.0)
MCH: 30.6 pg (ref 26.0–34.0)
MCHC: 31.3 g/dL (ref 30.0–36.0)
MCV: 97.8 fL (ref 80.0–100.0)
Platelets: 199 10*3/uL (ref 150–400)
RBC: 3.17 MIL/uL — ABNORMAL LOW (ref 3.87–5.11)
RDW: 14.2 % (ref 11.5–15.5)
WBC: 9.2 10*3/uL (ref 4.0–10.5)
nRBC: 0 % (ref 0.0–0.2)

## 2019-08-14 LAB — TSH: TSH: 1.014 u[IU]/mL (ref 0.350–4.500)

## 2019-08-14 LAB — GLUCOSE, CAPILLARY: Glucose-Capillary: 155 mg/dL — ABNORMAL HIGH (ref 70–99)

## 2019-08-14 MED ORDER — HEPARIN SODIUM (PORCINE) 1000 UNIT/ML DIALYSIS
1000.0000 [IU] | INTRAMUSCULAR | Status: DC | PRN
  Filled 2019-08-14: qty 1

## 2019-08-14 MED ORDER — ACETAMINOPHEN 650 MG RE SUPP: 650.0000 mg | Freq: Four times a day (QID) | RECTAL | Status: DC | PRN

## 2019-08-14 MED ORDER — INSULIN GLARGINE 100 UNIT/ML ~~LOC~~ SOLN
20.0000 [IU] | Freq: Every day | SUBCUTANEOUS | Status: DC
  Filled 2019-08-14: qty 0.2

## 2019-08-14 MED ORDER — ACETAMINOPHEN 325 MG PO TABS
650.0000 mg | ORAL_TABLET | Freq: Four times a day (QID) | ORAL | Status: DC | PRN
  Administered 2019-08-14: 650 mg via ORAL
  Filled 2019-08-14: qty 2

## 2019-08-14 MED ORDER — ALTEPLASE 2 MG IJ SOLR
2.0000 mg | Freq: Once | INTRAMUSCULAR | Status: DC | PRN
  Filled 2019-08-14: qty 2

## 2019-08-14 MED ORDER — HEPARIN SODIUM (PORCINE) 5000 UNIT/ML IJ SOLN: 5000.0000 [IU] | Freq: Three times a day (TID) | INTRAMUSCULAR | Status: DC

## 2019-08-14 MED ORDER — SODIUM CHLORIDE 0.9 % IV SOLN: 100.0000 mL | INTRAVENOUS | Status: DC | PRN

## 2019-08-14 MED ORDER — PENTAFLUOROPROP-TETRAFLUOROETH EX AERO
1.0000 "application " | INHALATION_SPRAY | CUTANEOUS | Status: DC | PRN
  Filled 2019-08-14: qty 30

## 2019-08-14 MED ORDER — INSULIN ASPART 100 UNIT/ML ~~LOC~~ SOLN: 0.0000 [IU] | Freq: Every day | SUBCUTANEOUS | Status: DC

## 2019-08-14 MED ORDER — ONDANSETRON HCL 4 MG PO TABS: 4.0000 mg | ORAL_TABLET | Freq: Four times a day (QID) | ORAL | Status: DC | PRN

## 2019-08-14 MED ORDER — LIDOCAINE HCL (PF) 1 % IJ SOLN
5.0000 mL | INTRAMUSCULAR | Status: DC | PRN
  Filled 2019-08-14: qty 5

## 2019-08-14 MED ORDER — AMLODIPINE BESYLATE 10 MG PO TABS: 10.0000 mg | ORAL_TABLET | Freq: Every evening | ORAL | Status: DC

## 2019-08-14 MED ORDER — LEVOTHYROXINE SODIUM 175 MCG PO TABS: 175.0000 ug | ORAL_TABLET | Freq: Every day | ORAL | Status: DC

## 2019-08-14 MED ORDER — DOCUSATE SODIUM 100 MG PO CAPS: 100.0000 mg | ORAL_CAPSULE | Freq: Two times a day (BID) | ORAL | Status: DC

## 2019-08-14 MED ORDER — FOSINOPRIL SODIUM 20 MG PO TABS
40.0000 mg | ORAL_TABLET | Freq: Every day | ORAL | Status: DC
  Filled 2019-08-14: qty 2

## 2019-08-14 MED ORDER — ATORVASTATIN CALCIUM 20 MG PO TABS: 40.0000 mg | ORAL_TABLET | Freq: Every day | ORAL | Status: DC

## 2019-08-14 MED ORDER — INSULIN ASPART 100 UNIT/ML ~~LOC~~ SOLN: 0.0000 [IU] | Freq: Three times a day (TID) | SUBCUTANEOUS | Status: DC

## 2019-08-14 MED ORDER — HEPARIN SODIUM (PORCINE) 1000 UNIT/ML DIALYSIS
20.0000 [IU]/kg | INTRAMUSCULAR | Status: DC | PRN
  Filled 2019-08-14: qty 2

## 2019-08-14 MED ORDER — ONDANSETRON HCL 4 MG/2ML IJ SOLN: 4.0000 mg | Freq: Four times a day (QID) | INTRAMUSCULAR | Status: DC | PRN

## 2019-08-14 MED ORDER — CALCIUM ACETATE (PHOS BINDER) 667 MG PO CAPS
1334.0000 mg | ORAL_CAPSULE | Freq: Three times a day (TID) | ORAL | Status: DC
  Filled 2019-08-14: qty 2

## 2019-08-14 MED ORDER — CARVEDILOL 12.5 MG PO TABS: 12.5000 mg | ORAL_TABLET | Freq: Every day | ORAL | Status: DC

## 2019-08-14 MED ORDER — LIDOCAINE-PRILOCAINE 2.5-2.5 % EX CREA
1.0000 "application " | TOPICAL_CREAM | CUTANEOUS | Status: DC | PRN
  Filled 2019-08-14: qty 5

## 2019-08-14 NOTE — ED Notes (Signed)
Resumed care from [place]. Pt is resting; adjusted HOB and provided pt with ice bag for comfort for aching breasts. Titrated nitro drip to 19.5 ml.

## 2019-08-14 NOTE — Progress Notes (Signed)
Post HD Assesment    08/14/19 1255  Neurological  Level of Consciousness Alert  Orientation Level Oriented X4  Respiratory  Respiratory Pattern Regular;Unlabored  Chest Assessment Chest expansion symmetrical  Bilateral Breath Sounds Clear;Diminished  Cough None  Cardiac  Pulse Regular  Heart Sounds S1, S2  ECG Monitor Yes  Vascular  R Radial Pulse +2  L Radial Pulse +2  Edema Generalized  Psychosocial  Psychosocial (WDL) WDL

## 2019-08-14 NOTE — Progress Notes (Signed)
Inpatient Diabetes Program Recommendations  AACE/ADA: New Consensus Statement on Inpatient Glycemic Control (2015)  Target Ranges:  Prepandial:   less than 140 mg/dL      Peak postprandial:   less than 180 mg/dL (1-2 hours)      Critically ill patients:  140 - 180 mg/dL   Results for Kendra [name], Kendra [name] (MRN [id number]) as of 08/14/2019 09:37  Ref. Range 08/13/2019 19:30  Glucose Latest Ref Range: 70 - 99 mg/dL 373 (H)     Admit with: SOB/ Resp Failure due to Pulm Edema  History: DM, ESRD, CHF  Home DM Meds: Lantus 20 units QHS  Current Orders: None     Getting Dialysis this AM.     MD- Please consider the following in-hospital insulin adjustments:  1. Start Lantus 15 units QHS (75% total home dose)  2. Start Novolog Sensitive Correction Scale/ SSI (0-9 units) TID AC + HS      --Will follow patient during hospitalization--  Wyn Quaker RN, MSN, CDE Diabetes Coordinator Inpatient Glycemic Control Team Team Pager: [phone] (8a-5p)

## 2019-08-14 NOTE — ED Notes (Signed)
Nitro increased related to last bp; pt says her back hurts; repositioned; will medicate with Tylenol

## 2019-08-14 NOTE — ED Notes (Signed)
Pt returned from dialysis with this RN and RT

## 2019-08-14 NOTE — Progress Notes (Signed)
[facility] [place] NAME: Kendra [name]    MR#:  [id number]  DATE OF BIRTH:  03-08-1951  SUBJECTIVE:   Patient presented with shortness of breath and had urgent dialysis yesterday.  Plan for dialysis today.  REVIEW OF SYSTEMS:    Review of Systems  Constitutional: Negative for fever, chills weight loss HENT: Negative for ear pain, nosebleeds, congestion, facial swelling, rhinorrhea, neck pain, neck stiffness and ear discharge.   Respiratory: Negative for cough, ++shortness of breath,no wheezing  Cardiovascular: Negative for chest pain, palpitations and leg swelling.  Gastrointestinal: Negative for heartburn, abdominal pain, vomiting, diarrhea or consitpation Genitourinary: Negative for dysuria, urgency, frequency, hematuria Musculoskeletal: Negative for back pain or joint pain Neurological: Negative for dizziness, seizures, syncope, focal weakness,  numbness and headaches.  Hematological: Does not bruise/bleed easily.  Psychiatric/Behavioral: Negative for hallucinations, confusion, dysphoric mood    Tolerating Diet:yes      DRUG ALLERGIES:  No Known Allergies  VITALS:  Blood pressure (!) 148/77, pulse 86, temperature (P) 98.1 F (36.7 C), temperature source (P) Oral, resp. rate (!) 26, height 5\' 6"  (1.676 m), weight 102.1 kg, SpO2 100 %.  PHYSICAL EXAMINATION:  Constitutional: Appears well-developed and well-nourished. No distress. HENT: Normocephalic. [name] Oropharynx is clear and moist.  Eyes: Conjunctivae and EOM are normal. PERRLA, no scleral icterus.  Neck: Normal ROM. Neck supple. No JVD. No tracheal deviation. CVS: RRR, S1/S2 +, no murmurs, no gallops, no carotid bruit.  Pulmonary: Effort and breath sounds normal, no stridor, rhonchi, wheezes, rales.  Abdominal: Soft. BS +,  no distension, tenderness, rebound or guarding.  Musculoskeletal: Normal range of motion. No edema and no tenderness.  Neuro: Alert. CN 2-12 grossly  intact. No focal deficits. Skin: Skin is warm and dry. No rash noted. Psychiatric: Normal mood and affect.      LABORATORY PANEL:   CBC Recent Labs  Lab 08/14/19 0451  WBC 9.2  HGB 9.7*  HCT 31.0*  PLT 199   ------------------------------------------------------------------------------------------------------------------  Chemistries  Recent Labs  Lab 08/13/19 1930  NA 141  K 4.6  CL 104  CO2 22  GLUCOSE 373*  BUN 64*  CREATININE 10.89*  CALCIUM 8.7*   ------------------------------------------------------------------------------------------------------------------  Cardiac Enzymes No results for input(s): TROPONINI in the last 168 hours. ------------------------------------------------------------------------------------------------------------------  RADIOLOGY:  Dg Chest Port 1 View  Result Date: 08/13/2019 CLINICAL DATA:  68 year old female with shortness of breath. EXAM: PORTABLE CHEST 1 VIEW COMPARISON:  Chest radiograph dated 02/03/2019 FINDINGS: There is shallow inspiration with bibasilar atelectasis. There is cardiomegaly with vascular congestion and probable mild edema. Pneumonia is not excluded. Clinical correlation is recommended. Trace bilateral pleural effusions may be present. No pneumothorax. No acute osseous pathology. Left axillary vascular stent noted. IMPRESSION: Cardiomegaly with vascular congestion and probable mild edema. Findings may represent fluid overload. Pneumonia is not excluded. Clinical correlation is recommended. Electronically Signed   By: Anner Crete M.D.   On: 08/13/2019 20:08     ASSESSMENT AND PLAN:   68 year old female with end-stage renal disease on hemodialysis and essential hypertension who presented to the emergency room via EMS due to shortness of breath.  1.  Acute hypoxic respiratory failure in the setting of pulmonary edema from end-stage renal disease on hemodialysis which has improved.  2.  ESRD on hemodialysis  Monday, Wednesday and Friday: Patient is planned for dialysis today. Nephrology consultation appreciated.  3.  Diabetes: Continue outpatient regimen with sliding scale and ADA diet  4.  Essential  hypertension: Stopped nitroglycerin drip Continue Norvasc, Coreg, fosinopril Labetalol as needed 5.  Hypothyroidism: Continue Synthroid  Management plans discussed with the patient and she is in agreement.  CODE STATUS: full  TOTAL TIME TAKING CARE OF THIS PATIENT: 30 minutes.     POSSIBLE D/C tomorrow, DEPENDING ON CLINICAL CONDITION.   Bettey Costa M.D on 08/14/2019 at 11:08 AM  Between 7am to 6pm - Pager - [phone] After 6pm go to [email or web address] - password [email or web address] [facility]  Office  [phone]  CC: Primary care physician; Baxter Hire, MD  Note: This dictation was prepared with Dragon dictation along with smaller phrase technology. Any transcriptional errors that result from this process are unintentional.

## 2019-08-14 NOTE — ED Notes (Signed)
In to see pt; says her back hurts and she's uncomfortable; repositioned; towels under arms for comfort; cords untangled; placed in hospital gown; warm blankets for comfort; call bell in reach; IV site to right AC unremarkable; sats 98-99% on 3L via [place]; pt awake and alert; talking in complete coherent sentences

## 2019-08-14 NOTE — ED Notes (Signed)
Tried to flush IV to right hand; too painful for pt; unable to draw back; dc/ed at this time

## 2019-08-14 NOTE — Care Management CC44 (Signed)
Condition Code 44 Documentation Completed  Patient Details  Name: TALISSA LAHTI MRN: [id number] Date of Birth: 07-05-1951   Condition Code 44 given:  Yes Patient signature on Condition Code 44 notice:  Yes Documentation of 2 MD's agreement:  Yes Code 44 added to claim:  Yes    Annamaria Boots, [place] 08/14/2019, 2:19 PM

## 2019-08-14 NOTE — Progress Notes (Signed)
   08/14/19 0100  Neurological  Level of Consciousness Alert  Orientation Level Oriented X4  Respiratory  Respiratory Pattern Labored  Bilateral Breath Sounds Diminished  Cardiac  Pulse Regular  HD unsuccessful due to multiple cannulation attempts x3 venous site unable to complete tx MD ordered pt to get temp DC for HD until access issue resolves. ED RN maureen notified pt being tx back to ER

## 2019-08-14 NOTE — ED Notes (Signed)
Call [facility] on pt's behalf to let them know she will not be there today; explained to staff member what was going on as allowed by pt

## 2019-08-14 NOTE — Discharge Summary (Signed)
[facility] NAME: Kendra [name]    MR#:  [id number]  [place] OF BIRTH:  [birth year]  DATE OF ADMISSION:  08/13/2019 ADMITTING PHYSICIAN: Harrie Foreman, MD  DATE OF DISCHARGE: 08/14/2019  PRIMARY CARE PHYSICIAN: Baxter Hire, MD    ADMISSION DIAGNOSIS:  Acute respiratory distress [R06.03] Acute pulmonary edema ([place]) [J81.0] End stage renal disease on dialysis ([place]) [N18.6, Z99.2] Volume overload [E87.70]  DISCHARGE DIAGNOSIS:  Active Problems:   Volume overload   SOB (shortness of breath)   SECONDARY DIAGNOSIS:   Past Medical History:  Diagnosis Date  . Anemia of chronic renal failure    DIALYSIS m/w/f  . Diabetic neuropathy ([place])   . Diabetic retinopathy ([place])   . Diastolic CHF ([place])   . ESRD (end stage renal disease) ([place])   . GERD (gastroesophageal reflux disease)   . Hyperparathyroidism, secondary renal ([place])   . Hypertension   . Hypothyroidism   . Insulin dependent diabetes mellitus ([place])   . Neuropathy    feet and fingers  . Osteoarthritis    bilateral shoulders  . Peripheral vascular disease ([place])   . Primary hypertension   . Shingles outbreak 04/2017    HOSPITAL COURSE:  68 year old female with end-stage renal disease on hemodialysis and essential hypertension who presented to the emergency room via EMS due to shortness of breath.  1.  Acute hypoxic respiratory failure in the setting of pulmonary edema rom end-stage renal disease on hemodialysis and HTN urgency which has improved.  2.  ESRD on hemodialysis Monday, Wednesday and Friday: Patient will continue dialysis on her routine days.  Nephrology consultation appreciated.  3.  Diabetes: She will continue ADA diet and outpatient regimen  4.  Essential hypertension: Concern drip was discontinued.  She will continue Norvasc, Coreg, fosinopril. Medications will be adjusted as per Dr. Candiss Norse as an outpatient.  5.  Hypothyroidism: Continue  Synthroid   DISCHARGE CONDITIONS AND DIET:  Stable for discharge renal diet  CONSULTS OBTAINED:    DRUG ALLERGIES:  No Known Allergies  DISCHARGE MEDICATIONS:   Allergies as of 08/14/2019   No Known Allergies     Medication List    STOP taking these medications   HYDROcodone-acetaminophen 5-325 MG tablet Commonly known as: NORCO/VICODIN     TAKE these medications   acetaminophen 500 MG tablet Commonly known as: TYLENOL Take 500 mg by mouth every 8 (eight) hours as needed for mild pain or moderate pain.   amLODipine 10 MG tablet Commonly known as: NORVASC Take 10 mg by mouth every evening.   atorvastatin 40 MG tablet Commonly known as: LIPITOR Take 40 mg by mouth at bedtime.   calcium acetate 667 MG capsule Commonly known as: PHOSLO Take 1,334 mg by mouth 3 (three) times daily with meals.   carvedilol 12.5 MG tablet Commonly known as: COREG Take 12.5 mg by mouth daily with supper.   docusate sodium 100 MG capsule Commonly known as: Colace Take 1 capsule (100 mg total) by mouth daily as needed.   fosinopril 40 MG tablet Commonly known as: MONOPRIL Take 40 mg by mouth daily.   furosemide 80 MG tablet Commonly known as: LASIX Take 80 mg by mouth every Tuesday, Thursday, Saturday, and Sunday. In the morning   gabapentin 100 MG capsule Commonly known as: NEURONTIN Take 100 mg by mouth at bedtime.   Lantus SoloStar 100 UNIT/ML Solostar Pen Generic drug: Insulin Glargine Inject 20 Units into the skin daily at  10 pm.   levothyroxine 175 MCG tablet Commonly known as: SYNTHROID Take 175 mcg by mouth daily before breakfast.   lidocaine-prilocaine cream Commonly known as: EMLA Apply 1 application topically Every Tuesday,Thursday,and Saturday with dialysis.   multivitamin Tabs tablet Take 1 tablet by mouth daily.   omeprazole 20 MG capsule Commonly known as: PRILOSEC Take 20 mg by mouth daily.   polyethylene glycol 17 g packet Commonly known as:  MIRALAX / GLYCOLAX Take 17 g by mouth daily.         Today   CHIEF COMPLAINT:  Patient seen in dialysis and doing well.  Shortness of breath is resolved.  Blood pressure is improved.   VITAL SIGNS:  Blood pressure (!) 148/77, pulse 86, temperature 98.1 F (36.7 C), temperature source Oral, resp. rate (!) 26, height 5\' 6"  (1.676 m), weight 102.1 kg, SpO2 100 %.   REVIEW OF SYSTEMS:  Review of Systems  Constitutional: Negative.  Negative for chills, fever and malaise/fatigue.  HENT: Negative.  Negative for ear discharge, ear pain, hearing loss, nosebleeds and sore throat.   Eyes: Negative.  Negative for blurred vision and pain.  Respiratory: Negative.  Negative for cough, hemoptysis, shortness of breath and wheezing.   Cardiovascular: Negative.  Negative for chest pain, palpitations and leg swelling.  Gastrointestinal: Negative.  Negative for abdominal pain, blood in stool, diarrhea, nausea and vomiting.  Genitourinary: Negative.  Negative for dysuria.  Musculoskeletal: Negative.  Negative for back pain.  Skin: Negative.   Neurological: Negative for dizziness, tremors, speech change, focal weakness, seizures and headaches.  Endo/Heme/Allergies: Negative.  Does not bruise/bleed easily.  Psychiatric/Behavioral: Negative.  Negative for depression, hallucinations and suicidal ideas.     PHYSICAL EXAMINATION:  GENERAL:  68 y.o.-year-old patient lying in the bed with no acute distress.  NECK:  Supple, no jugular venous distention. No thyroid enlargement, no tenderness.  LUNGS: Normal breath sounds bilaterally, no wheezing, rales,rhonchi  No use of accessory muscles of respiration.  CARDIOVASCULAR: S1, S2 normal. No murmurs, rubs, or gallops.  ABDOMEN: Soft, non-tender, non-distended. Bowel sounds present. No organomegaly or mass.  EXTREMITIES: No pedal edema, cyanosis, or clubbing.  PSYCHIATRIC: The patient is alert and oriented x 3.  SKIN: No obvious rash, lesion, or ulcer.    DATA REVIEW:   CBC Recent Labs  Lab 08/14/19 0451  WBC 9.2  HGB 9.7*  HCT 31.0*  PLT 199    Chemistries  Recent Labs  Lab 08/13/19 1930  NA 141  K 4.6  CL 104  CO2 22  GLUCOSE 373*  BUN 64*  CREATININE 10.89*  CALCIUM 8.7*    Cardiac Enzymes No results for input(s): TROPONINI in the last 168 hours.  Microbiology Results  @[email or web address] @  RADIOLOGY:  Dg Chest Port 1 View  Result Date: 08/13/2019 CLINICAL DATA:  68 year old female with shortness of breath. EXAM: PORTABLE CHEST 1 VIEW COMPARISON:  Chest radiograph dated 02/03/2019 FINDINGS: There is shallow inspiration with bibasilar atelectasis. There is cardiomegaly with vascular congestion and probable mild edema. Pneumonia is not excluded. Clinical correlation is recommended. Trace bilateral pleural effusions may be present. No pneumothorax. No acute osseous pathology. Left axillary vascular stent noted. IMPRESSION: Cardiomegaly with vascular congestion and probable mild edema. Findings may represent fluid overload. Pneumonia is not excluded. Clinical correlation is recommended. Electronically Signed   By: Anner Crete M.D.   On: 08/13/2019 20:08      Allergies as of 08/14/2019   No Known Allergies     Medication List  STOP taking these medications   HYDROcodone-acetaminophen 5-325 MG tablet Commonly known as: NORCO/VICODIN     TAKE these medications   acetaminophen 500 MG tablet Commonly known as: TYLENOL Take 500 mg by mouth every 8 (eight) hours as needed for mild pain or moderate pain.   amLODipine 10 MG tablet Commonly known as: NORVASC Take 10 mg by mouth every evening.   atorvastatin 40 MG tablet Commonly known as: LIPITOR Take 40 mg by mouth at bedtime.   calcium acetate 667 MG capsule Commonly known as: PHOSLO Take 1,334 mg by mouth 3 (three) times daily with meals.   carvedilol 12.5 MG tablet Commonly known as: COREG Take 12.5 mg by mouth daily with supper.   docusate sodium  100 MG capsule Commonly known as: Colace Take 1 capsule (100 mg total) by mouth daily as needed.   fosinopril 40 MG tablet Commonly known as: MONOPRIL Take 40 mg by mouth daily.   furosemide 80 MG tablet Commonly known as: LASIX Take 80 mg by mouth every Tuesday, Thursday, Saturday, and Sunday. In the morning   gabapentin 100 MG capsule Commonly known as: NEURONTIN Take 100 mg by mouth at bedtime.   Lantus SoloStar 100 UNIT/ML Solostar Pen Generic drug: Insulin Glargine Inject 20 Units into the skin daily at 10 pm.   levothyroxine 175 MCG tablet Commonly known as: SYNTHROID Take 175 mcg by mouth daily before breakfast.   lidocaine-prilocaine cream Commonly known as: EMLA Apply 1 application topically Every Tuesday,Thursday,and Saturday with dialysis.   multivitamin Tabs tablet Take 1 tablet by mouth daily.   omeprazole 20 MG capsule Commonly known as: PRILOSEC Take 20 mg by mouth daily.   polyethylene glycol 17 g packet Commonly known as: MIRALAX / GLYCOLAX Take 17 g by mouth daily.         Management plans discussed with the patient and she is in agreement. Stable for discharge home with HHC  Patient should follow up with dr singh   CODE STATUS:     Code Status Orders  (From admission, onward)         Start     Ordered   08/14/19 0409  Full code  Continuous     10 /26/20 0408        Code Status History    Date Active Date Inactive Code Status Order ID Comments User Context   02/02/2019 1306 02/04/2019 0103 Full Code [id number]  Sela Hua, MD Inpatient   05/04/2017 0653 05/08/2017 1824 Full Code [id number]  Harrie Foreman, MD Inpatient   08/03/2016 1512 08/03/2016 2023 Full Code [id number]  Algernon Huxley, MD Inpatient   05/14/2016 1234 05/14/2016 1631 Full Code [id number]  Algernon Huxley, MD Inpatient   Advance Care Planning Activity      TOTAL TIME TAKING CARE OF THIS PATIENT: 38 minutes.    Note: This dictation was prepared with Dragon  dictation along with smaller phrase technology. Any transcriptional errors that result from this process are unintentional.  Bettey Costa M.D on 08/14/2019 at 11:43 AM  Between 7am to 6pm - Pager - [phone] After 6pm go to [email or web address] - password [email or web address] [facility]  Office  [phone]  CC: Primary care physician; Baxter Hire, MD

## 2019-08-14 NOTE — Progress Notes (Signed)
[facility], [place] 08/14/19  Subjective:   Patient presented last night for shortness of breath.  States her blood pressure was elevated.  Blood pressure recorded at 218/113 at presentation.  Patient was initially placed on BiPAP.  She was urgently dialyzed last night but she only got 30 minutes of dialysis due to inadequate cannulation.  Blood pressure control improved after she was placed on nitroglycerin.  She was seen today during dialysis.  Blood pressure control is significantly better. Patient denies missing any dialysis treatments as outpatient.   HEMODIALYSIS FLOWSHEET:  Blood Flow Rate (mL/min): 400 mL/min Arterial Pressure (mmHg): -200 mmHg Venous Pressure (mmHg): 160 mmHg Transmembrane Pressure (mmHg): 60 mmHg Ultrafiltration Rate (mL/min): 1000 mL/min Dialysate Flow Rate (mL/min): 800 ml/min Conductivity: Machine : 13.8 Conductivity: Machine : 13.8 Dialysis Fluid Bolus: Normal Saline Bolus Amount (mL): 250 mL     Objective:  Vital signs in last 24 hours:  Temp:  [98.1 F (36.7 C)-98.3 F (36.8 C)] 98.1 F (36.7 C) (10/26 1340) Pulse Rate:  [81-112] 101 (10/26 1340) Resp:  [19-42] 25 (10/26 1340) BP: (113-218)/(65-113) 144/81 (10/26 1340) SpO2:  [90 %-100 %] 93 % (10/26 1340) FiO2 (%):  [60 %] 60 % (10/25 1931) Weight:  [99.8 kg-102.1 kg] 99.8 kg (10/26 1300)  Weight change:  Filed Weights   08/13/19 1933 08/14/19 0900 08/14/19 1300  Weight: 102.1 kg 102.1 kg 99.8 kg    Intake/Output:    Intake/Output Summary (Last 24 hours) at 08/14/2019 1618 Last data filed at 08/14/2019 1300 Gross per 24 hour  Intake -  Output 5189 ml  Net -5189 ml     Physical Exam: General:  Laying in the bed, no acute distress  HEENT  anicteric, moist oral mucous membranes  Pulm/lungs  clear to auscultation bilaterally  CVS/Heart  regular, no rub  Abdomen:   Soft, nontender  Extremities:  No peripheral edema  Neurologic:  Alert, oriented   Skin:  Warm, dry  Access: Left arm AVF       Basic Metabolic Panel:  Recent Labs  Lab 08/13/19 1930  NA 141  K 4.6  CL 104  CO2 22  GLUCOSE 373*  BUN 64*  CREATININE 10.89*  CALCIUM 8.7*     CBC: Recent Labs  Lab 08/13/19 1930 08/14/19 0451  WBC 10.8* 9.2  NEUTROABS 5.7  --   HGB 11.7* 9.7*  HCT 38.4 31.0*  MCV 100.0 97.8  PLT 247 199      Lab Results  Component Value Date   HEPBSAG Negative 05/05/2017      Microbiology:  Recent Results (from the past 240 hour(s))  SARS Coronavirus 2 by RT PCR (hospital order, performed in [facility] [place] hospital lab) Nasopharyngeal Nasopharyngeal Swab     Status: None   Collection Time: 08/13/19  7:30 PM   Specimen: Nasopharyngeal Swab  Result Value Ref Range Status   SARS Coronavirus 2 NEGATIVE NEGATIVE Final    Comment: (NOTE) If result is NEGATIVE SARS-CoV-2 target nucleic acids are NOT DETECTED. The SARS-CoV-2 RNA is generally detectable in upper and lower  respiratory specimens during the acute phase of infection. The lowest  concentration of SARS-CoV-2 viral copies this assay can detect is 250  copies / mL. A negative result does not preclude SARS-CoV-2 infection  and should not be used as the sole basis for treatment or other  patient management decisions.  A negative result may occur with  improper specimen collection / handling, submission of specimen other  than nasopharyngeal swab, presence of viral mutation(s) within the  areas targeted by this assay, and inadequate number of viral copies  (<250 copies / mL). A negative result must be combined with clinical  observations, patient history, and epidemiological information. If result is POSITIVE SARS-CoV-2 target nucleic acids are DETECTED. The SARS-CoV-2 RNA is generally detectable in upper and lower  respiratory specimens dur ing the acute phase of infection.  Positive  results are indicative of active infection with SARS-CoV-2.  Clinical  correlation  with patient history and other diagnostic information is  necessary to determine patient infection status.  Positive results do  not rule out bacterial infection or co-infection with other viruses. If result is PRESUMPTIVE POSTIVE SARS-CoV-2 nucleic acids MAY BE PRESENT.   A presumptive positive result was obtained on the submitted specimen  and confirmed on repeat testing.  While 2019 novel coronavirus  (SARS-CoV-2) nucleic acids may be present in the submitted sample  additional confirmatory testing may be necessary for epidemiological  and / or clinical management purposes  to differentiate between  SARS-CoV-2 and other Sarbecovirus currently known to infect humans.  If clinically indicated additional testing with an alternate test  methodology [phone]) is advised. The SARS-CoV-2 RNA is generally  detectable in upper and lower respiratory sp ecimens during the acute  phase of infection. The expected result is Negative. Fact Sheet for Patients:  StrictlyIdeas.no Fact Sheet for Healthcare Providers: BankingDealers.co.za This test is not yet approved or cleared by the [place] FDA and has been authorized for detection and/or diagnosis of SARS-CoV-2 by FDA under an Emergency Use Authorization (EUA).  This EUA will remain in effect (meaning this test can be used) for the duration of the COVID-19 declaration under Section 564(b)(1) of the Act, 21 U.S.C. section 360bbb-3(b)(1), unless the authorization is terminated or revoked sooner. Performed at [facility], [address] [place], [place] [address]   Blood culture (routine x 2)     Status: None (Preliminary result)   Collection Time: 08/13/19  7:30 PM   Specimen: BLOOD  Result Value Ref Range Status   Specimen Description BLOOD RIGHT ANTECUBITAL  Final   Special Requests   Final    BOTTLES DRAWN AEROBIC AND ANAEROBIC Blood Culture adequate volume   Culture   Final     NO GROWTH < 24 HOURS Performed at [facility], [address], [place], [place] [address]    Report Status PENDING  Incomplete  Blood culture (routine x 2)     Status: None (Preliminary result)   Collection Time: 08/13/19  7:34 PM   Specimen: BLOOD  Result Value Ref Range Status   Specimen Description   Final    BLOOD Blood Culture results may not be optimal due to an inadequate volume of blood received in culture bottles   Special Requests   Final    BOTTLES DRAWN AEROBIC AND ANAEROBIC BLOOD RIGHT HAND   Culture   Final    NO GROWTH < 12 HOURS Performed at [facility], [address] [place], [place] [address]    Report Status PENDING  Incomplete    Coagulation Studies: No results for input(s): LABPROT, INR in the last 72 hours.  Urinalysis: No results for input(s): COLORURINE, LABSPEC, PHURINE, GLUCOSEU, HGBUR, BILIRUBINUR, KETONESUR, PROTEINUR, UROBILINOGEN, NITRITE, LEUKOCYTESUR in the last 72 hours.  Invalid input(s): APPERANCEUR    Imaging: Dg Chest Port 1 View  Result Date: 08/13/2019 CLINICAL DATA:  68 year old female with shortness of breath. EXAM:  PORTABLE CHEST 1 VIEW COMPARISON:  Chest radiograph dated 02/03/2019 FINDINGS: There is shallow inspiration with bibasilar atelectasis. There is cardiomegaly with vascular congestion and probable mild edema. Pneumonia is not excluded. Clinical correlation is recommended. Trace bilateral pleural effusions may be present. No pneumothorax. No acute osseous pathology. Left axillary vascular stent noted. IMPRESSION: Cardiomegaly with vascular congestion and probable mild edema. Findings may represent fluid overload. Pneumonia is not excluded. Clinical correlation is recommended. Electronically Signed   By: Anner Crete M.D.   On: 08/13/2019 20:08     Medications:    . amLODipine  10 mg Oral QPM  . atorvastatin  40 mg Oral QHS  . calcium acetate  1,334 mg Oral TID WC  . carvedilol  12.5 mg Oral Q  supper  . Chlorhexidine Gluconate Cloth  6 each Topical Q0600  . docusate sodium  100 mg Oral BID  . fosinopril  40 mg Oral Daily  . heparin  5,000 Units Subcutaneous Q8H  . insulin aspart  0-5 Units Subcutaneous QHS  . insulin aspart  0-9 Units Subcutaneous TID WC  . insulin glargine  20 Units Subcutaneous Q2200  . [START ON 08/15/2019] levothyroxine  175 mcg Oral QAC breakfast   acetaminophen **OR** acetaminophen, ondansetron **OR** ondansetron (ZOFRAN) IV  Assessment/ Plan:  68 y.o. African American female with end-stage renal disease, diabetes, hypertension presented for hypertensive urgency leading to shortness of breath  [facility] MWF-2// TW 102.5kg// [facility]  1.  Shortness of breath, likely secondary to hypertensive urgency 2.  Acute respiratory failure 3.  Hypertensive urgency  Patient seen during dialysis.  Tolerating well.  Blood pressure dropped at the end of dialysis.  Ultrafiltration was then stopped. Total calculated volume removal 5000 cc Discharge weight of 99.8 kg Outpatient blood pressure regimen includes amlodipine 10 mg daily, carvedilol 12.5 mg daily at supper, Fosinopril 40 mg daily Will reevaluate dry weight as outpatient and adjust antihypertensives as necessary.      LOS: [place] 10/26/20204:18 PM  [place], [place]  Note: This note was prepared with Dragon dictation. Any transcription errors are unintentional

## 2019-08-14 NOTE — Progress Notes (Signed)
   08/13/19 2236  Neurological  Level of Consciousness Alert  Orientation Level Oriented X4  Respiratory  Respiratory Pattern Labored (BIPAP IN USE )  Bilateral Breath Sounds Diminished;Fine crackles  Cardiac  Pulse Regular  ECG Monitor No  PT ARRIVED TO UNIT AT 2228 VIA STRETCHER WITH ER RN (MAUREEN) AND RESP THERAPY AT SIDE WITH BIPAP IN USE, PT STABLE

## 2019-08-14 NOTE — Progress Notes (Signed)
HD Pre Assessment    08/14/19 0900  Neurological  Level of Consciousness Alert  Orientation Level Oriented X4  Respiratory  Respiratory Pattern Regular;Labored  Chest Assessment Chest expansion symmetrical  Bilateral Breath Sounds Diminished;Fine crackles  Cough None  Cardiac  Pulse Regular  Heart Sounds S1, S2  ECG Monitor Yes  Vascular  R Radial Pulse +2  L Radial Pulse +2  Edema Generalized  Psychosocial  Psychosocial (WDL) WDL

## 2019-08-14 NOTE — Progress Notes (Signed)
Patient transported on Bipap back to ED room. RN at bedside. No complications. Patient placed on 3L [place], SAT maintaining at 99-100%. No distress noted. Clear diminished bilateral breath sounds.

## 2019-08-14 NOTE — ED Notes (Signed)
This RN to bedside at this time due to patient's IV pump beeping. Pt requesting that her feet be covered up with blanket, [place] be adjusted due to feeling like she was being choked by [place], and for the "wash rags" under her breasts to be adjusted due to pain under her breasts for her "moles". This RN obliged patient to make her more comfortable, pt denies further needs.

## 2019-08-14 NOTE — Progress Notes (Signed)
HD Tx started w/o complication    [id number]  Vital Signs  Pulse Rate (!) 102  Pulse Rate Source Monitor  Resp (!) 35  BP (!) 191/95  BP Location Right Arm  BP Method Automatic  Patient Position (if appropriate) Lying  Oxygen Therapy  SpO2 100 %  O2 Device Nasal Cannula  O2 Flow Rate (L/min) 4 L/min  Pulse Oximetry Type Continuous  During Hemodialysis Assessment  Blood Flow Rate (mL/min) 400 mL/min  Arterial Pressure (mmHg) -180 mmHg  Venous Pressure (mmHg) 250 mmHg  Transmembrane Pressure (mmHg) 70 mmHg  Ultrafiltration Rate (mL/min) 1000 mL/min  Dialysate Flow Rate (mL/min) 800 ml/min  Conductivity: Machine  13.9  HD Safety Checks Performed Yes  Dialysis Fluid Bolus Normal Saline  Bolus Amount (mL) 250 mL  Intra-Hemodialysis Comments Tx initiated (Pt stable)

## 2019-08-14 NOTE — ED Notes (Signed)
This RN spoke with dialysis RN, per dialysis RN having trouble with Dialysis access and pt needs temporary dialysis access. Dr. Marcille Blanco paged. VS per dialysis RN at [phone]

## 2019-08-14 NOTE — ED Notes (Signed)
Pt transported to dialysis; attempted to call dialysis to report that pt is on tele and is on O2 (wrong info given during report); no answer

## 2019-08-14 NOTE — Progress Notes (Signed)
Established hemodialysis patient known at [facility] MWF 11:00. Pateint self transports to treatments. Please contact me with any dialysis placement concerns.  Elvera Bicker Dialysis Coordinator [phone]

## 2019-08-14 NOTE — Progress Notes (Addendum)
Patient discharged. Awaiting volunteer to roll down to the medical mall entrance. Discharge teaching reviewed with patient and then again with patient's sister once she arrived with patient's clothes. Snack provided since patient not on unit long enough for lunch tray to arrive. Patient called her PCP's office herself to schedule follow up appointment.

## 2019-08-14 NOTE — Progress Notes (Signed)
Pre HD TX    08/14/19 0900  Hand-Off documentation  Report given to (Full Name) Beatris Ship, RN  Report received from (Full Name) Rhona Raider, RN   Vital Signs  Temp 98.1 F (36.7 C)  Temp Source Oral  Pulse Rate 91  Pulse Rate Source Monitor  Resp (!) 26  BP (!) 149/80  BP Location Right Arm  BP Method Automatic  Patient Position (if appropriate) Lying  Oxygen Therapy  SpO2 99 %  O2 Device Nasal Cannula  O2 Flow Rate (L/min) 4 L/min  Pulse Oximetry Type Continuous  Pain Assessment  Pain Scale 0-10  Pain Score 0  Dialysis Weight  Weight 102.1 kg  Type of Weight Pre-Dialysis  Time-Out for Hemodialysis  What Procedure? HD  Pt Identifiers(min of two) First/Last Name;MRN/Account#  Correct Site? Yes  Correct Side? Yes  Correct Procedure? Yes  Consents Verified? Yes  Rad Studies Available? N/A  Safety Precautions Reviewed? Yes  Engineer, civil (consulting) Number 1  Station Number 3  UF/Alarm Test Passed  Conductivity: Meter 14  Conductivity: Machine  13.8  pH 7  Reverse Osmosis Main  Normal Saline Lot Number [id number]  Dialyzer Lot Number [id number]  Disposable Set Lot Number [id number]  Machine Temperature 98.6 F (37 C)  Musician and Audible Yes  Blood Lines Intact and Secured Yes  Pre Treatment Patient Checks  Vascular access used during treatment Fistula  HD catheter dressing before treatment  (NA)  Hemodialysis Consent Verified Yes  Hemodialysis Standing Orders Initiated Yes  ECG (Telemetry) Monitor On Yes  Prime Ordered Normal Saline  Length of  DialysisTreatment -hour(s) 3 Hour(s)  Dialysis Treatment Comments Na 140  Dialyzer Elisio 17H NR  Dialysate 3K;2.5 Ca  Dialysis Anticoagulant None  Dialysate Flow Ordered 800  Blood Flow Rate Ordered 400 mL/min  Ultrafiltration Goal 2.5 Liters  Dialysis Blood Pressure Support Ordered Normal Saline  Education / Care Plan  Dialysis Education Provided Yes  Documented Education in Care Plan Yes

## 2019-08-18 LAB — CULTURE, BLOOD (ROUTINE X 2)
Culture: NO GROWTH
Culture: NO GROWTH
Special Requests: ADEQUATE

## 2019-08-24 MED FILL — Nitroglycerin IV Soln 200 MCG/ML in D5W: INTRAVENOUS | Qty: 250 | Status: AC

## 2019-09-19 ENCOUNTER — Ambulatory Visit (INDEPENDENT_AMBULATORY_CARE_PROVIDER_SITE_OTHER): Payer: Medicare Other | Admitting: Nurse Practitioner

## 2019-09-19 ENCOUNTER — Encounter (INDEPENDENT_AMBULATORY_CARE_PROVIDER_SITE_OTHER): Payer: Medicare Other

## 2019-10-20 DEATH — deceased
